# Patient Record
Sex: Male | Born: 1945 | Race: White | Hispanic: No | Smoking: Former smoker
Health system: Southern US, Community
[De-identification: ages and names within clinical notes are randomized; demographics above are authoritative.]

## PROBLEM LIST (undated history)

## (undated) DIAGNOSIS — I420 Dilated cardiomyopathy: Secondary | ICD-10-CM

## (undated) DIAGNOSIS — M1990 Unspecified osteoarthritis, unspecified site: Secondary | ICD-10-CM

## (undated) DIAGNOSIS — K219 Gastro-esophageal reflux disease without esophagitis: Secondary | ICD-10-CM

## (undated) DIAGNOSIS — I219 Acute myocardial infarction, unspecified: Secondary | ICD-10-CM

## (undated) DIAGNOSIS — H33321 Round hole, right eye: Secondary | ICD-10-CM

## (undated) DIAGNOSIS — F1010 Alcohol abuse, uncomplicated: Secondary | ICD-10-CM

## (undated) DIAGNOSIS — I739 Peripheral vascular disease, unspecified: Secondary | ICD-10-CM

## (undated) DIAGNOSIS — I509 Heart failure, unspecified: Secondary | ICD-10-CM

## (undated) DIAGNOSIS — I480 Paroxysmal atrial fibrillation: Secondary | ICD-10-CM

## (undated) DIAGNOSIS — L299 Pruritus, unspecified: Secondary | ICD-10-CM

## (undated) DIAGNOSIS — K5792 Diverticulitis of intestine, part unspecified, without perforation or abscess without bleeding: Secondary | ICD-10-CM

## (undated) DIAGNOSIS — Z720 Tobacco use: Secondary | ICD-10-CM

## (undated) DIAGNOSIS — I2510 Atherosclerotic heart disease of native coronary artery without angina pectoris: Secondary | ICD-10-CM

## (undated) DIAGNOSIS — IMO0001 Reserved for inherently not codable concepts without codable children: Secondary | ICD-10-CM

## (undated) HISTORY — PX: OTHER SURGICAL HISTORY: SHX169

## (undated) HISTORY — DX: Heart failure, unspecified: I50.9

## (undated) HISTORY — PX: EYE SURGERY: SHX253

## (undated) HISTORY — DX: Dilated cardiomyopathy: I42.0

## (undated) HISTORY — PX: VASECTOMY: SHX75

## (undated) HISTORY — DX: Acute myocardial infarction, unspecified: I21.9

---

## 2012-09-02 ENCOUNTER — Encounter: Payer: Self-pay | Admitting: Cardiology

## 2012-09-02 DIAGNOSIS — I471 Supraventricular tachycardia: Secondary | ICD-10-CM

## 2012-09-03 ENCOUNTER — Inpatient Hospital Stay (HOSPITAL_COMMUNITY)
Admission: EM | Admit: 2012-09-03 | Discharge: 2012-09-11 | DRG: 208 | Disposition: A | Discharge status: Home / Self Care | Payer: MEDICARE | Source: Other Acute Inpatient Hospital | Attending: Internal Medicine | Admitting: Internal Medicine

## 2012-09-03 ENCOUNTER — Encounter (HOSPITAL_COMMUNITY): Payer: Self-pay | Admitting: Pulmonary Disease

## 2012-09-03 ENCOUNTER — Inpatient Hospital Stay (HOSPITAL_COMMUNITY): Payer: MEDICARE

## 2012-09-03 DIAGNOSIS — A419 Sepsis, unspecified organism: Secondary | ICD-10-CM

## 2012-09-03 DIAGNOSIS — K56609 Unspecified intestinal obstruction, unspecified as to partial versus complete obstruction: Secondary | ICD-10-CM | POA: Diagnosis present

## 2012-09-03 DIAGNOSIS — I4891 Unspecified atrial fibrillation: Secondary | ICD-10-CM

## 2012-09-03 DIAGNOSIS — J449 Chronic obstructive pulmonary disease, unspecified: Secondary | ICD-10-CM | POA: Insufficient documentation

## 2012-09-03 DIAGNOSIS — K5792 Diverticulitis of intestine, part unspecified, without perforation or abscess without bleeding: Secondary | ICD-10-CM | POA: Diagnosis present

## 2012-09-03 DIAGNOSIS — E870 Hyperosmolality and hypernatremia: Secondary | ICD-10-CM

## 2012-09-03 DIAGNOSIS — K5732 Diverticulitis of large intestine without perforation or abscess without bleeding: Secondary | ICD-10-CM

## 2012-09-03 DIAGNOSIS — I428 Other cardiomyopathies: Secondary | ICD-10-CM

## 2012-09-03 DIAGNOSIS — I5021 Acute systolic (congestive) heart failure: Secondary | ICD-10-CM

## 2012-09-03 DIAGNOSIS — K631 Perforation of intestine (nontraumatic): Secondary | ICD-10-CM | POA: Diagnosis present

## 2012-09-03 DIAGNOSIS — J811 Chronic pulmonary edema: Secondary | ICD-10-CM

## 2012-09-03 DIAGNOSIS — J9600 Acute respiratory failure, unspecified whether with hypoxia or hypercapnia: Principal | ICD-10-CM | POA: Diagnosis present

## 2012-09-03 DIAGNOSIS — I429 Cardiomyopathy, unspecified: Secondary | ICD-10-CM | POA: Diagnosis present

## 2012-09-03 DIAGNOSIS — I959 Hypotension, unspecified: Secondary | ICD-10-CM | POA: Diagnosis present

## 2012-09-03 DIAGNOSIS — F17200 Nicotine dependence, unspecified, uncomplicated: Secondary | ICD-10-CM | POA: Diagnosis present

## 2012-09-03 DIAGNOSIS — R7881 Bacteremia: Secondary | ICD-10-CM | POA: Diagnosis present

## 2012-09-03 DIAGNOSIS — I4892 Unspecified atrial flutter: Secondary | ICD-10-CM | POA: Diagnosis present

## 2012-09-03 DIAGNOSIS — E876 Hypokalemia: Secondary | ICD-10-CM

## 2012-09-03 DIAGNOSIS — I509 Heart failure, unspecified: Secondary | ICD-10-CM | POA: Diagnosis present

## 2012-09-03 DIAGNOSIS — I2510 Atherosclerotic heart disease of native coronary artery without angina pectoris: Secondary | ICD-10-CM

## 2012-09-03 HISTORY — DX: Alcohol abuse, uncomplicated: F10.10

## 2012-09-03 HISTORY — DX: Tobacco use: Z72.0

## 2012-09-03 LAB — URINALYSIS, ROUTINE W REFLEX MICROSCOPIC
Ketones, ur: NEGATIVE mg/dL
Leukocytes, UA: NEGATIVE
Nitrite: NEGATIVE
Specific Gravity, Urine: 1.022 (ref 1.005–1.030)
Urobilinogen, UA: 1 mg/dL (ref 0.0–1.0)

## 2012-09-03 LAB — CBC
HCT: 42.9 % (ref 39.0–52.0)
MCH: 31.6 pg (ref 26.0–34.0)
MCHC: 32.4 g/dL (ref 30.0–36.0)
MCV: 97.5 fL (ref 78.0–100.0)
Platelets: 284 10*3/uL (ref 150–400)
RDW: 14.5 % (ref 11.5–15.5)
WBC: 8.5 10*3/uL (ref 4.0–10.5)

## 2012-09-03 LAB — COMPREHENSIVE METABOLIC PANEL
Albumin: 2 g/dL — ABNORMAL LOW (ref 3.5–5.2)
BUN: 29 mg/dL — ABNORMAL HIGH (ref 6–23)
Calcium: 8.5 mg/dL (ref 8.4–10.5)
Chloride: 106 mEq/L (ref 96–112)
Creatinine, Ser: 0.94 mg/dL (ref 0.50–1.35)
Total Bilirubin: 0.3 mg/dL (ref 0.3–1.2)

## 2012-09-03 LAB — POCT I-STAT 3, ART BLOOD GAS (G3+)
O2 Saturation: 93 %
pCO2 arterial: 49.5 mmHg — ABNORMAL HIGH (ref 35.0–45.0)
pH, Arterial: 7.345 — ABNORMAL LOW (ref 7.350–7.450)

## 2012-09-03 LAB — GLUCOSE, CAPILLARY: Glucose-Capillary: 153 mg/dL — ABNORMAL HIGH (ref 70–99)

## 2012-09-03 LAB — URINE MICROSCOPIC-ADD ON

## 2012-09-03 LAB — APTT: aPTT: 39 seconds — ABNORMAL HIGH (ref 24–37)

## 2012-09-03 LAB — TROPONIN I: Troponin I: <0.3 ng/mL (ref <0.30)

## 2012-09-03 LAB — PROCALCITONIN: Procalcitonin: 0.6 ng/mL

## 2012-09-03 LAB — LACTIC ACID, PLASMA: Lactic Acid, Venous: 1.4 mmol/L (ref 0.5–2.2)

## 2012-09-03 LAB — MAGNESIUM: Magnesium: 2.1 mg/dL (ref 1.5–2.5)

## 2012-09-03 MED ORDER — CHLORHEXIDINE GLUCONATE 0.12 % MT SOLN
15.0000 mL | Freq: Two times a day (BID) | OROMUCOSAL | Status: DC
  Administered 2012-09-03 – 2012-09-05 (×5): 15 mL via OROMUCOSAL
  Filled 2012-09-03 (×5): qty 15

## 2012-09-03 MED ORDER — SODIUM CHLORIDE 0.9 % IV SOLN
INTRAVENOUS | Status: DC
  Administered 2012-09-03: 1000 mL via INTRAVENOUS
  Administered 2012-09-03: 22:00:00 via INTRAVENOUS

## 2012-09-03 MED ORDER — MIDAZOLAM HCL 2 MG/2ML IJ SOLN
1.0000 mg | INTRAMUSCULAR | Status: DC | PRN
  Administered 2012-09-03 – 2012-09-04 (×6): 2 mg via INTRAVENOUS
  Filled 2012-09-03 (×5): qty 2

## 2012-09-03 MED ORDER — SODIUM CHLORIDE 0.9 % IV SOLN
1.0000 g | INTRAVENOUS | Status: DC
  Administered 2012-09-03 – 2012-09-04 (×2): 1 g via INTRAVENOUS
  Filled 2012-09-03 (×3): qty 1

## 2012-09-03 MED ORDER — PANTOPRAZOLE SODIUM 40 MG IV SOLR
40.0000 mg | Freq: Every day | INTRAVENOUS | Status: DC
  Administered 2012-09-03 – 2012-09-10 (×8): 40 mg via INTRAVENOUS
  Filled 2012-09-03 (×9): qty 40

## 2012-09-03 MED ORDER — SODIUM CHLORIDE 0.9 % IV SOLN
250.0000 mL | INTRAVENOUS | Status: DC | PRN
  Administered 2012-09-03: 250 mL via INTRAVENOUS
  Administered 2012-09-03: 22:00:00 via INTRAVENOUS

## 2012-09-03 MED ORDER — METOPROLOL TARTRATE 1 MG/ML IV SOLN: 2.5000 mg | INTRAVENOUS | Status: DC | PRN

## 2012-09-03 MED ORDER — AMIODARONE HCL IN DEXTROSE 360-4.14 MG/200ML-% IV SOLN
0.5000 mg/min | INTRAVENOUS | Status: DC
  Administered 2012-09-03 – 2012-09-10 (×12): 0.5 mg/min via INTRAVENOUS
  Filled 2012-09-03 (×31): qty 200

## 2012-09-03 MED ORDER — ASPIRIN 81 MG PO CHEW
324.0000 mg | CHEWABLE_TABLET | ORAL | Status: AC
Start: 2012-09-03 — End: 2012-09-03

## 2012-09-03 MED ORDER — METOPROLOL TARTRATE 1 MG/ML IV SOLN
2.5000 mg | INTRAVENOUS | Status: DC | PRN
  Administered 2012-09-04 (×2): 5 mg via INTRAVENOUS
  Filled 2012-09-03 (×2): qty 5

## 2012-09-03 MED ORDER — ASPIRIN 300 MG RE SUPP
300.0000 mg | RECTAL | Status: AC
  Administered 2012-09-03: 300 mg via RECTAL
  Filled 2012-09-03: qty 1

## 2012-09-03 MED ORDER — ALBUTEROL SULFATE HFA 108 (90 BASE) MCG/ACT IN AERS
6.0000 | INHALATION_SPRAY | RESPIRATORY_TRACT | Status: DC | PRN
  Filled 2012-09-03: qty 6.7

## 2012-09-03 MED ORDER — PROPOFOL 10 MG/ML IV EMUL
INTRAVENOUS | Status: AC
  Filled 2012-09-03: qty 100

## 2012-09-03 MED ORDER — BIOTENE DRY MOUTH MT LIQD
15.0000 mL | Freq: Four times a day (QID) | OROMUCOSAL | Status: DC
Start: 2012-09-04 — End: 2012-09-06
  Administered 2012-09-04 – 2012-09-05 (×8): 15 mL via OROMUCOSAL

## 2012-09-03 MED ORDER — PHENYLEPHRINE HCL 10 MG/ML IJ SOLN
30.0000 ug/min | INTRAVENOUS | Status: DC
  Administered 2012-09-03: 30 ug/min via INTRAVENOUS
  Administered 2012-09-04: 15 ug/min via INTRAVENOUS
  Filled 2012-09-03 (×2): qty 1

## 2012-09-03 MED ORDER — SODIUM CHLORIDE 0.9 % IV SOLN
25.0000 ug/h | INTRAVENOUS | Status: DC
  Administered 2012-09-03: 50 ug/h via INTRAVENOUS
  Filled 2012-09-03 (×2): qty 50

## 2012-09-03 MED ORDER — FENTANYL BOLUS VIA INFUSION
25.0000 ug | Freq: Four times a day (QID) | INTRAVENOUS | Status: DC | PRN
  Filled 2012-09-03: qty 100

## 2012-09-03 MED ORDER — AMIODARONE HCL IN DEXTROSE 360-4.14 MG/200ML-% IV SOLN
1.0000 mg/min | INTRAVENOUS | Status: AC
  Administered 2012-09-03: 1 mg/min via INTRAVENOUS
  Filled 2012-09-03: qty 200

## 2012-09-03 NOTE — H&P (Signed)
PULMONARY  / CRITICAL CARE MEDICINE  Name: Ronnie Harvey MRN: [id number] DOB: 10/26/1945    LOS: 0  REFERRING MD :  [facility]  CHIEF COMPLAINT:  Acute Respiratory Failure / SBO  BRIEF PATIENT DESCRIPTION: 51 M with no PMH adm to [facility] on 12/14 with abd pain.  CT abd > diverticulitis with microperforation.  Course complicated by AFRVR, decompensation of respiratory status requiring intubation 12/17.  Tx to ICU at [facility] for further care.      LINES / TUBES: 12/17 ETT>>> 12/17 R [place] [facility]>>>  CULTURES: 12/14 UA>>>mod bacteria, neg nitrate  ANTIBIOTICS: 12/15 Zosyn>>>12/17 12/17 Invanz>>>  SIGNIFICANT EVENTS:  12/14 - Admit to [facility] with SBO 12/15 - SVT / Afib with RVR 12/15 Echo - LVEF 20-25%, diffuse HK with some variation (post-lateral worse than anterior) 12/17 - early am, decompensated requiring intubation, central line placement, tx to [facility]  LEVEL OF CARE:  ICU PRIMARY SERVICE:  PCCM CONSULTANTS:  CCS CODE STATUS:  Full Code DIET:  NPO DVT Px:  SCD's GI Px:  Protonix  HISTORY OF PRESENT ILLNESS: 65 y/o M with no prior known medical hx, known former ETOH, continued smoking who presented to [facility] on 12/14 with a one week hx of of abdominal pain.  Found to have a SBO, sigmoid diverticulitis with free fluid & micro pockets of air.  Conservatively managed with NGT, bowel rest,   12/15 decompensated with SVT and was transferred to ICU.  Pt noted to have Afib with RVR, ECHO with EF of 20%, severe global hypokinesis and PASP of 60.  He was treated with Cardizem for SVT but changed to amiodarone (unclear if r/t hypotension).  Early 12/17 am, patient decompensated requiring intubation. Tx to [facility] for further evaluation. Of note, early in the summer he had episodes of dyspnea and would not go for evaluation.     PAST MEDICAL HISTORY :  Past Medical History  Diagnosis Date  . ETOH abuse   . Tobacco abuse    Past Surgical History   Procedure Date  . Neck cyst resection    Prior to Admission medications   Not on File  No known Prior Medications  No Known Allergies  FAMILY HISTORY:  No family history on file.  SOCIAL HISTORY:  reports that he has been smoking Cigarettes.  He has a 49 pack-year smoking history. He does not have any smokeless tobacco history on file. He reports that he does not drink alcohol. His drug history not on file.  REVIEW OF SYSTEMS:  Unable to complete as pt on vent / sedated.    INTERVAL HISTORY: sedate on vent, no distress, hemodynamically stable  VITAL SIGNS: FiO2 (%):  [50 %] 50 % (12/17 1530) HEMODYNAMICS:   VENTILATOR SETTINGS: Vent Mode:  [-] PRVC FiO2 (%):  [50 %] 50 % Set Rate:  [18 bmp] 18 bmp Vt Set:  [500 mL] 500 mL PEEP:  [5 cmH20] 5 cmH20  INTAKE / OUTPUT: Intake/Output    None     PHYSICAL EXAMINATION: General:  wdwn adult male on vent Neuro:  Sedate, pinpoint pupils HEENT:  Mm pink/moist, OETT 7.5 @ 23 Cardiovascular:  s1s2 rrr, no m/r/g, no jvd Lungs: resp's even / non-labored on vent, lungs bilaterally with faint crackles Abdomen:  Distended, tympanic, unable to assess tenderness Musculoskeletal:  No acute deformities Skin:  Warm/dry, no overt edema   LABS: Cbc No results found for this basename: WBC:,HGB:3,HCT:3,PLT:3 in the last 168 hours  Chemistry  No results found for this basename: NA:3,K:3,CL:3,CO2:3,BUN:3,CREATININE:3,CALCIUM:3,MG:3,PHOS:3,GLUCOSE:3 in the last 168 hours  Liver fxn No results found for this basename: AST:3,ALT:3,ALKPHOS:3,BILITOT:3,PROT:3,ALBUMIN:3 in the last 168 hours  coags No results found for this basename: APTT:3,INR:3 in the last 168 hours  Sepsis markers No results found for this basename: LATICACIDVEN:3,PROCALCITON:3 in the last 168 hours  Cardiac markers No results found for this basename: CKTOTAL:3,CKMB:3,TROPONINI:3 in the last 168 hours  BNP No results found for this basename: PROBNP:3 in the last  168 hours  ABG No results found for this basename: PHART:3,PCO2ART:3,PO2ART:3,HCO3:3,TCO2:3 in the last 168 hours  CBG trend No results found for this basename: GLUCAP:5 in the last 168 hours  IMAGING: 12/17 CXR: improved edema  ECG:  DIAGNOSES: Principal Problem:  *Diverticulitis Active Problems:  Bowel microperforation  Acute respiratory failure due to pulmonary edema  Paroxysmal AF  Cardiomyopathy  Smoker   ASSESSMENT / PLAN:  PULMONARY  ASSESSMENT: Acute Respiratory Failure  - in setting of abdominal process, Afib with RVR, Cardiogenic pulmonary edema Presumed COPD - no hx of PFT's, 49 yr smoking hx. Elevated PA pressures noted on ECHO (PAS 60).     PLAN:   -full vent support, adjust tidal volume -repeat ABG in one hour -eval CXR to confirm ETT post transfer  CARDIOVASCULAR  ASSESSMENT:  Atrial Fibrilation with RVR - SVT / RVR noted at [facility], rx'd with cardizem initially and changed to amiodarone - unclear if this was related to hypotension.  CAD - noted femoral bruits on exam and evidence of atherosclerotic disease in aorta & coronary arteries.   Mild bump (0.7, 0.9) in troponin with no associated chest pain, BNP mildly elevated.  CHF - EF of 20%, mod LVE, mod MR, mild-mod TR and PASP of 60   PLAN:  -hold further diuresis 12/17, received 80 mg lasix 12/17 at [facility] -Cardiology Consult -Cont amiodarone -PRN beta blocker for HR > 115 -Aspirin  -cycle biomarkers -am EKG  RENAL  ASSESSMENT:   No acute issues.   PLAN:   -monitor renal function, electrolytes with SBO -f/u BMP in am  GASTROINTESTINAL  ASSESSMENT:   Diverticulitis with microperforation -     PLAN:   -CCS consult -NGT to [facility] -PPI ordered    HEMATOLOGIC  ASSESSMENT:   No acute issues.   PLAN:  -monitor h/h   INFECTIOUS  ASSESSMENT:   SBO, ? Perforated diverticulitis with associated peritonitis.   ? UTI  - noted mod bacteria on UA, no nitrate  PLAN:   -hold  cultures as was on abx upon transfer.  -Abx as above   ENDOCRINE  ASSESSMENT:   No acute issues.     PLAN:   -CBG's Q6 while NPO  NEUROLOGIC  ASSESSMENT:   Pain - in setting of SBO  PLAN:   -Fentanyl gtt, PRN versed pushes for sedtion -d/c propofol -assess mental status once off propofol  CLINICAL SUMMARY:      I have personally obtained a history, examined the patient, evaluated laboratory and imaging results, formulated the assessment and plan and placed orders.  CRITICAL CARE: The patient is critically ill with multiple organ systems failure and requires high complexity decision making for assessment and support, frequent evaluation and titration of therapies, application of advanced monitoring technologies and extensive interpretation of multiple databases. Critical Care Time devoted to patient care services described in this note is 45 minutes.     Merwyn Katos, MD Pulmonary and Critical Care Medicine [facility] Pager: [phone]  09/03/2012,  4:22 PM

## 2012-09-03 NOTE — Consult Note (Signed)
Reason for Consult:microperforated diverticulitis   Ronnie Harvey is an 66 y.o. male.  HPI: the patient is a 66 year old was transferred from an outside hospital. Patient at the time of admission to the outside hospital was diagnosed with microperforation of diverticulitis. At this time patient had exacerbation of his congestive heart failure requiring intubation.  Secondary this patient transferred a [facility] hospital for escalation of care. Patient has been continued on Neo-Synephrine as well as Invanz. Patient was said to have been transferred with a CD of a CT scan at outside hospital that was not able to review this CD.  Past Medical History  Diagnosis Date  . ETOH abuse   . Tobacco abuse     Past Surgical History  Procedure Date  . Neck cyst resection     No family history on file.  Social History:  reports that he has been smoking Cigarettes.  He has a 49 pack-year smoking history. He does not have any smokeless tobacco history on file. He reports that he does not drink alcohol. His drug history not on file.  Allergies: No Known Allergies  Medications: I have reviewed the patient's current medications.  Results for orders placed during the hospital encounter of 09/03/12 (from the past 48 hour(s))  MRSA PCR SCREENING     Status: Normal   Collection Time   09/03/12  3:24 PM      Component Value Range Comment   MRSA by PCR NEGATIVE  NEGATIVE   GLUCOSE, CAPILLARY     Status: Abnormal   Collection Time   09/03/12  3:53 PM      Component Value Range Comment   Glucose-Capillary 153 (*) 70 - 99 mg/dL   TROPONIN I     Status: Normal   Collection Time   09/03/12  3:58 PM      Component Value Range Comment   Troponin I <0.30  <0.30 ng/mL   PRO B NATRIURETIC PEPTIDE     Status: Abnormal   Collection Time   09/03/12  3:58 PM      Component Value Range Comment   Pro B Natriuretic peptide (BNP) 7946.0 (*) 0 - 125 pg/mL   PROCALCITONIN     Status: Normal   Collection Time   09/03/12  4:30 PM      Component Value Range Comment   Procalcitonin 0.60     PROTIME-INR     Status: Abnormal   Collection Time   09/03/12  4:30 PM      Component Value Range Comment   Prothrombin Time 17.7 (*) 11.6 - 15.2 seconds    INR 1.50 (*) 0.00 - 1.49   APTT     Status: Abnormal   Collection Time   09/03/12  4:30 PM      Component Value Range Comment   aPTT 39 (*) 24 - 37 seconds   POCT I-STAT 3, BLOOD GAS (G3+)     Status: Abnormal   Collection Time   09/03/12  5:43 PM      Component Value Range Comment   pH, Arterial 7.345 (*) 7.350 - 7.450    pCO2 arterial 49.5 (*) 35.0 - 45.0 mmHg    pO2, Arterial 73.0 (*) 80.0 - 100.0 mmHg    Bicarbonate 27.0 (*) 20.0 - 24.0 mEq/L    TCO2 29  0 - 100 mmol/L    O2 Saturation 93.0      Acid-Base Excess 1.0  0.0 - 2.0 mmol/L    Collection site RADIAL,  ALLEN'S TEST ACCEPTABLE      Drawn by Operator      Sample type ARTERIAL       Portable Chest Xray To Verify Ett/ Central Line Placement  09/03/2012  *RADIOLOGY REPORT*  Clinical Data: ET tube placement.  PORTABLE CHEST - 1 VIEW  Comparison: Chest 09/03/2012 at.  Findings: Endotracheal tube is in place with tip just below the clavicular heads in good position.  NG tube courses into the stomach.  Bilateral airspace disease has improved.  No pneumothorax identified.  There appear be small bilateral pleural effusions.  IMPRESSION:  1.  ET tube in good position. 2.  Improved bilateral airspace disease.   Original Report Authenticated By: Holley Dexter, M.D.     Review of Systems  Unable to perform ROS  Blood pressure 111/63, pulse 70, temperature 97.7 F (36.5 C), temperature source Oral, resp. rate 18, height 5\' 4"  (1.626 m), weight 156 lb 15.5 oz (71.2 kg), SpO2 93.00%. Physical Exam  Constitutional: He is oriented to person, place, and time. He appears well-developed and well-nourished.  HENT:  Head: Normocephalic and atraumatic.  Eyes: Conjunctivae normal and EOM are normal.  Pupils are equal, round, and reactive to light.  Neck: Normal range of motion. Neck supple.  Cardiovascular: Normal rate and normal heart sounds.   Respiratory: Effort normal and breath sounds normal.  GI: Soft. Bowel sounds are normal. He exhibits no mass.  Musculoskeletal: Normal range of motion.  Neurological: He is alert and oriented to person, place, and time.    Assessment/Plan: The patient is a 67 year old male with a microperforation of diverticulitis  At this time continue antibiotics and N.p.o.. At this point there is no surgical intervention is planned. Continue supportive care. We'll continue to follow.  Marigene Ehlers., Cyndi Montejano 09/03/2012, 6:29 PM

## 2012-09-03 NOTE — Progress Notes (Signed)
ANTIBIOTIC CONSULT NOTE - INITIAL  Pharmacy Consult for Invanz Indication: perf. bowel  No Known Allergies  Patient Measurements:   Adjusted Body Weight: 71.2 kg  Vital Signs:   Intake/Output from previous day:   Intake/Output from this shift:    Labs: No results found for this basename: WBC:3,HGB:3,PLT:3,LABCREA:3,CREATININE:3 in the last 72 hours CrCl is unknown because no creatinine reading has been taken and the patient has no height on file. No results found for this basename: VANCOTROUGH:2,VANCOPEAK:2,VANCORANDOM:2,GENTTROUGH:2,GENTPEAK:2,GENTRANDOM:2,TOBRATROUGH:2,TOBRAPEAK:2,TOBRARND:2,AMIKACINPEAK:2,AMIKACINTROU:2,AMIKACIN:2, in the last 72 hours   From [facility]: WBC 17.8; H/H 15.4/49.8; Pltc 408 Scr 0.77  Microbiology: No results found for this or any previous visit (from the past 720 hour(s)).  Medical History: Past Medical History  Diagnosis Date  . ETOH abuse   . Tobacco abuse     Medications:  No prescriptions prior to admission  current list pending  Assessment: 66 yo male transferred from [facility] with acute systolic HF, perforated diverticulitis.  Pharmacy asked to dose empiric ertapenem.  Dose was ordered at [facility] today per records, but doesn't appear that dose was given.   Est. CrCl ~ 70 ml/min, labs from [facility] pending.  Goal of Therapy:  Resolution of infection  Plan: 1. Ertapenem 1g IV q 24 hrs. 2. F/U plans, cultures.  Lindsie Simar C 09/03/2012,4:19 PM

## 2012-09-03 NOTE — Progress Notes (Signed)
Call to Dr Frederico Hamman to report hypotension; NS X2 going at 999 each.

## 2012-09-03 NOTE — Progress Notes (Signed)
  Amiodarone Drug - Drug Interaction Consult Note  Recommendations: No issues identified at this time.  Will continue to monitor.  Amiodarone is metabolized by the cytochrome P450 system and therefore has the potential to cause many drug interactions. Amiodarone has an average plasma half-life of 50 days (range 20 to 100 days).   There is potential for drug interactions to occur several weeks or months after stopping treatment and the onset of drug interactions may be slow after initiating amiodarone.   []  Statins: Increased risk of myopathy. Simvastatin- restrict dose to 20mg  daily. Other statins: counsel patients to report any muscle pain or weakness immediately.  []  Anticoagulants: Amiodarone can increase anticoagulant effect. Consider warfarin dose reduction. Patients should be monitored closely and the dose of anticoagulant altered accordingly, remembering that amiodarone levels take several weeks to stabilize.  []  Antiepileptics: Amiodarone can increase plasma concentration of phenytoin, phenytoin dose should be reduced. Note that small changes in phenytoin dose can result in large changes in phenytoin levels. Monitor patient closely and counsel on signs of toxicity.  []  Beta blockers: increased risk of bradycardia, AV block and myocardial depression. Sotalol - avoid concomitant use.  []   Calcium channel blockers (diltiazem and verapamil): increased risk of bradycardia, AV block and myocardial depression.  []   Cyclosporine: Amiodarone increases levels of cyclosporine. Reduced dose of cyclosporine is recommended.  []  Digoxin dose should be halved when amiodarone is started.  []  Diuretics: increased risk of cardiotoxicity if hypokalemia occurs.  []  Oral hypoglycemic agents (glyburide, glipizide, glimepiride): increased risk of hypoglycemia. Patient's glucose levels should be monitored closely when initiating amiodarone therapy.   []  Drugs that prolong the QT interval: Concurrent  therapy is contraindicated due to the increased risk of torsades de pointes; . Antibiotics: e.g. fluoroquinolones, erythromycin. . Antiarrhythmics: e.g. quinidine, procainamide, disopyramide, sotalol. . Antipsychotics: e.g. phenothiazines, haloperidol.  . Lithium, tricyclic antidepressants, and methadone. Thank You,  Gardner Candle  09/03/2012 8:13 PM

## 2012-09-04 ENCOUNTER — Inpatient Hospital Stay (HOSPITAL_COMMUNITY): Payer: MEDICARE

## 2012-09-04 DIAGNOSIS — K631 Perforation of intestine (nontraumatic): Secondary | ICD-10-CM

## 2012-09-04 DIAGNOSIS — J4489 Other specified chronic obstructive pulmonary disease: Secondary | ICD-10-CM

## 2012-09-04 DIAGNOSIS — J449 Chronic obstructive pulmonary disease, unspecified: Secondary | ICD-10-CM

## 2012-09-04 LAB — HEPATIC FUNCTION PANEL
Bilirubin, Direct: 0.1 mg/dL (ref 0.0–0.3)
Total Bilirubin: 0.3 mg/dL (ref 0.3–1.2)

## 2012-09-04 LAB — BLOOD GAS, ARTERIAL
O2 Saturation: 97.5 %
PEEP: 5 cmH2O
Patient temperature: 98.6
RATE: 18 resp/min

## 2012-09-04 LAB — BASIC METABOLIC PANEL
CO2: 28 mEq/L (ref 19–32)
Glucose, Bld: 100 mg/dL — ABNORMAL HIGH (ref 70–99)
Potassium: 3.7 mEq/L (ref 3.5–5.1)
Sodium: 147 mEq/L — ABNORMAL HIGH (ref 135–145)

## 2012-09-04 LAB — CBC
Hemoglobin: 14.6 g/dL (ref 13.0–17.0)
MCH: 31.6 pg (ref 26.0–34.0)
RBC: 4.62 MIL/uL (ref 4.22–5.81)

## 2012-09-04 LAB — TSH: TSH: 1.998 u[IU]/mL (ref 0.350–4.500)

## 2012-09-04 MED ORDER — FUROSEMIDE 10 MG/ML IJ SOLN
40.0000 mg | Freq: Two times a day (BID) | INTRAMUSCULAR | Status: DC
  Administered 2012-09-04 – 2012-09-05 (×2): 40 mg via INTRAVENOUS
  Filled 2012-09-04 (×4): qty 4

## 2012-09-04 MED ORDER — HEPARIN SODIUM (PORCINE) 5000 UNIT/ML IJ SOLN
5000.0000 [IU] | Freq: Three times a day (TID) | INTRAMUSCULAR | Status: DC
  Administered 2012-09-04 – 2012-09-11 (×21): 5000 [IU] via SUBCUTANEOUS
  Filled 2012-09-04 (×24): qty 1

## 2012-09-04 NOTE — Progress Notes (Signed)
Patient ID: Ronnie Harvey, male   DOB: April 29, 1946, 66 y.o.   MRN: [id number]    Subjective: On vent  Objective: Vital signs in last 24 hours: Temp:  [97.7 F (36.5 C)-98.9 F (37.2 C)] 98.9 F (37.2 C) (12/18 0430) Pulse Rate:  [47-111] 111  (12/18 0746) Resp:  [17-25] 17  (12/18 0746) BP: (74-133)/(45-84) 111/84 mmHg (12/18 0746) SpO2:  [91 %-96 %] 96 % (12/18 0746) FiO2 (%):  [40 %-50 %] 40 % (12/18 0746) Weight:  [156 lb 12 oz (71.1 kg)-156 lb 15.5 oz (71.2 kg)] 156 lb 12 oz (71.1 kg) (12/18 0400)    Intake/Output from previous day: 12/17 0701 - 12/18 0700 In: 1496.2 [I.V.:1286.2; NG/GT:120; IV Piggyback:60] Out: 745 [Urine:445; Emesis/NG output:300] Intake/Output this shift:    General appearance: alert, uncooperative and responsive but apparently confused GI: Active BS.  Mild ditention but soft and without apparent tenderness  Lab Results:   Basename 09/04/12 0400 09/03/12 2042  WBC 9.7 8.5  HGB 14.6 13.9  HCT 45.0 42.9  PLT 316 284   BMET  Basename 09/04/12 0400 09/03/12 2042  NA 147* 144  K 3.7 3.4*  CL 108 106  CO2 28 28  GLUCOSE 100* 135*  BUN 30* 29*  CREATININE 1.03 0.94  CALCIUM 8.9 8.5     Studies/Results: Portable Chest Xray In Am  09/04/2012  *RADIOLOGY REPORT*  Clinical Data: Evaluate pulmonary edema, endotracheal tube  PORTABLE CHEST - 1 VIEW  Comparison: 09/03/2012 (multiple examinations); 09/02/2012; 08/30/2012  Findings: Grossly unchanged enlarged cardiac silhouette and mediastinal contours with tortuosity within the thoracic aorta. Atherosclerotic calcifications in the aortic arch.  The pulmonary vasculature remains indistinct.  Grossly unchanged perihilar heterogeneous air space opacities, right greater than left.  No definite pleural effusion or pneumothorax.  Unchanged bones.  IMPRESSION: 1.  Stable positioning of support apparatus.  No pneumothorax. 2.  Grossly unchanged perihilar air space opacities which given rapidity of onset is  favored to represent pulmonary edema, though note, underlying infection and/or aspiration is not excluded.   Original Report Authenticated By: Tacey Ruiz, MD    Portable Chest Xray To Verify Ett/ Central Line Placement  09/03/2012  *RADIOLOGY REPORT*  Clinical Data: ET tube placement.  PORTABLE CHEST - 1 VIEW  Comparison: Chest 09/03/2012 at.  Findings: Endotracheal tube is in place with tip just below the clavicular heads in good position.  NG tube courses into the stomach.  Bilateral airspace disease has improved.  No pneumothorax identified.  There appear be small bilateral pleural effusions.  IMPRESSION:  1.  ET tube in good position. 2.  Improved bilateral airspace disease.   Original Report Authenticated By: Holley Dexter, M.D.     Anti-infectives: Anti-infectives     Start     Dose/Rate Route Frequency Ordered Stop   09/03/12 1615   ertapenem (INVANZ) 1 g in sodium chloride 0.9 % 50 mL IVPB        1 g 100 mL/hr over 30 Minutes Intravenous Every 24 hours 09/03/12 1603            Assessment/Plan: Transfer in for VDRF, CHF and pulm edema Sigmoid diverticulitis with microperforation on presentation to outside hospital He is afebrile with normal WBC and abdomen seems non tender Doubt source of sepsis in abdomen. Continue IV abx and repeat CT in a couple of days    LOS: 1 day    Leonidus Rowand T 09/04/2012

## 2012-09-04 NOTE — Progress Notes (Signed)
Patient: Ronnie Harvey / Admit Date: 09/03/2012 / Date of Encounter: 09/04/2012, 1:15 PM   Subjective  Interim history: 66 y/o M with no prior medical history presented to [facility] 08/31/12 with abdominal pain and diagnosed with diverticulitis and potentially perforated diverticulum. He was seen by cardiology and found to have SVT felt to be atrial flutter as well as atrial fib. CT did demonstrate atherosclerotic calcification within the aorta and coronary arteries. Troponin up to 0.9 at [facility] (ref range 0.3-0.6). 2D echo showed EF of 20%, LV chamber mod dilated, global severe HK per notes, RVSP & mod MR. He was sent down for CT at [facility] and was noted to be diaphoretic, tachycardic and in congestive heart failure. He was intubated for acute respiratory failure and given IV Lasix. He was transferred to [facility] for further critical care management. He remains on antibiotics. Supportive care at present for perforation.  Was sinus for a while this AM, but recently went back into afib. Just received IV metoprolol and rates low 100's. (when in sinus, HR was 70s-80s). Pt denies any pain.   Objective   Telemetry: atrial fib 130s-160s Physical Exam: Filed Vitals:   09/04/12 1100  BP: 135/99  Pulse: 134  Temp:   Resp: 23  Temp 99, 97% vent General: Well developed, well nourished WM on vent Head: Normocephalic, atraumatic, sclera non-icteric, no xanthomas, nares are without discharge. ?exopthalmos Neck: JVD not elevated. Lungs: Coarse BS bilaterally, no wheezes or rales. Breathing is unlabored. Heart: RRR S1 S2 without murmurs, rubs, or gallops.  Abdomen: Distended, hypoactive BS, nontender. Msk: No acute deformities. Extremities: Warm, dry, no edema. Distal pulses in tact. Neuro: Alert on vent. Able to follow commands. Frustrated with mittens.  Psych:  Intubated.    Intake/Output Summary (Last 24 hours) at 09/04/12 1315 Last data filed at 09/04/12  0900  Gross per 24 hour  Intake 1666.33 ml  Output    745 ml  Net 921.33 ml    Inpatient Medications:    . antiseptic oral rinse  15 mL Mouth Rinse QID  . chlorhexidine  15 mL Mouth Rinse BID  . ertapenem  1 g Intravenous Q24H  . furosemide  40 mg Intravenous Q12H  . heparin subcutaneous  5,000 Units Subcutaneous Q8H  . pantoprazole (PROTONIX) IV  40 mg Intravenous QHS    Labs:  Basename 09/04/12 0400 09/03/12 2042  NA 147* 144  K 3.7 3.4*  CL 108 106  CO2 28 28  GLUCOSE 100* 135*  BUN 30* 29*  CREATININE 1.03 0.94  CALCIUM 8.9 8.5  MG -- 2.1  PHOS -- --    Basename 09/04/12 0400 09/03/12 2042  AST 21 19  ALT 13 13  ALKPHOS 59 55  BILITOT 0.3 0.3  PROT 6.0 5.7*  ALBUMIN 2.1* 2.0*    Basename 09/04/12 0400 09/03/12 2042  WBC 9.7 8.5  NEUTROABS -- --  HGB 14.6 13.9  HCT 45.0 42.9  MCV 97.4 97.5  PLT 316 284    Basename 09/04/12 0400 09/03/12 2118 09/03/12 1558  CKTOTAL -- -- --  CKMB -- -- --  TROPONINI <0.30 <0.30 <0.30   No components found with this basename: POCBNP:3 No results found for this basename: HGBA1C in the last 72 hours No results found for this basename: CHOL,HDL,LDLCALC,TRIG,CHOLHDL in the last 72 hours  Radiology/Studies:  Portable Chest Xray In Am  09/04/2012  *RADIOLOGY REPORT*  Clinical Data: Evaluate pulmonary edema, endotracheal tube  PORTABLE CHEST -  1 VIEW  Comparison: 09/03/2012 (multiple examinations); 09/02/2012; 08/30/2012  Findings: Grossly unchanged enlarged cardiac silhouette and mediastinal contours with tortuosity within the thoracic aorta. Atherosclerotic calcifications in the aortic arch.  The pulmonary vasculature remains indistinct.  Grossly unchanged perihilar heterogeneous air space opacities, right greater than left.  No definite pleural effusion or pneumothorax.  Unchanged bones.  IMPRESSION: 1.  Stable positioning of support apparatus.  No pneumothorax. 2.  Grossly unchanged perihilar air space opacities which given  rapidity of onset is favored to represent pulmonary edema, though note, underlying infection and/or aspiration is not excluded.   Original Report Authenticated By: Tacey Ruiz, MD    Portable Chest Xray To Verify Ett/ Central Line Placement  09/03/2012  *RADIOLOGY REPORT*  Clinical Data: ET tube placement.  PORTABLE CHEST - 1 VIEW  Comparison: Chest 09/03/2012 at.  Findings: Endotracheal tube is in place with tip just below the clavicular heads in good position.  NG tube courses into the stomach.  Bilateral airspace disease has improved.  No pneumothorax identified.  There appear be small bilateral pleural effusions.  IMPRESSION:  1.  ET tube in good position. 2.  Improved bilateral airspace disease.   Original Report Authenticated By: Holley Dexter, M.D.      Assessment and Plan  1. Diverticulitis with bowel microperforation, SBO - surgery following. 2. Acute respiratory failure, requiring intubation - vent mgmt per PCCM. In setting of abdominal process, afib RVR, cardiogenic pulmonary edema. Diuresis has been ordered. 3. Paroxysmal atrial fib & atrial flutter (newly recognized this adm) with RVR - likely exacerbated by acute illness. CHADS2 = 1 at present. Would not fully anticoagulate in setting of acute abdominal illness. Will discuss rate mgmt with MD. Continue amiodarone for now. 4. Elevated troponins with possible underlying CAD - trop 0.09 at [facility]. Troponins negative here. Atherosclerotic dz on CT at that time. Will need cath when clinical status improves. If OK with surgery, continue low dose ASA. 5. Acute systolic CHF/newly recognized cardiomyopathy with EF 20%  - received 80mg  IV Lasix at [facility]. Written to receive 40mg  IV q12 x 4 doses beginning this afternoon. Cr stable. Follow I/O, daily wts. 6. Elevated PT/INR - repeat in AM. 7. Hypotension - BP down to 70's yesterday systolic requiring IVF, brief neo. Still soft this AM but currently stable. Will also send blood cx. 8. Mod  pHTN - ? r/t longstanding tobacco abuse or LV dysfunction.  Signed, Ronie Spies PA-C Patient seen and examined. I agree with the assessment and plan as detailed above. See also my additional thoughts below.   I have reviewed all of the data extensively. The patient is seen and examined. I have spoken with Ronie Spies PA-C about the patient. I agree with all the plans that have been outlined and I have made my own adjustments in the notes. We saw the patient for the first time earlier this week at [facility]. He had atrial fibrillation. The echo finding of an ejection fraction of 20% was new information. The patient needs to be treated for his current respiratory failure with heart failure and his perforated viscus. Then before he goes home he needs to undergo cardiac catheterization.  Willa Rough, MD, [facility] 09/04/2012 3:37 PM

## 2012-09-04 NOTE — H&P (Signed)
PULMONARY  / CRITICAL CARE MEDICINE  Name: KWAMANE WHACK MRN: [id number] DOB: 18-Dec-1945    LOS: 1  REFERRING MD :  [facility]  CHIEF COMPLAINT:  Acute Respiratory Failure / SBO  BRIEF PATIENT DESCRIPTION: 21 M with no PMH adm to [facility] on 12/14 with abd pain.  CT abd > diverticulitis with microperforation.  Course complicated by AFRVR, decompensation of respiratory status requiring intubation 12/17.  Tx to ICU at [facility] for further care.    LINES / TUBES: 12/17 ETT>>> 12/17 R Watts TLC>>>  CULTURES: 12/14 UA>>>  ANTIBIOTICS: 12/15 Zosyn>>>12/17 12/17 Invanz>>>  SIGNIFICANT EVENTS:  12/14 - Admit to [facility] with SBO 12/15 - SVT / Afib with RVR 12/15 Echo - LVEF 20-25%, diffuse HK with some variation (post-lateral worse than anterior) 12/17 - early am, decompensated requiring intubation, central line placement, tx to [facility] 12/18- failed weaning  LEVEL OF CARE:  ICU PRIMARY SERVICE:  PCCM CONSULTANTS:  CCS CODE STATUS:  Full Code DIET:  NPO DVT Px:  SCD's GI Px:  Protonix  INTERVAL HISTORY:  Failed weaning this am  VITAL SIGNS: Temp:  [97.7 F (36.5 C)-99 F (37.2 C)] 99 F (37.2 C) (12/18 0848) Pulse Rate:  [47-134] 134  (12/18 1100) Resp:  [16-25] 23  (12/18 1100) BP: (74-140)/(45-99) 135/99 mmHg (12/18 1100) SpO2:  [91 %-97 %] 97 % (12/18 1100) FiO2 (%):  [40 %-50 %] 40 % (12/18 1100) Weight:  [71.1 kg (156 lb 12 oz)-71.2 kg (156 lb 15.5 oz)] 71.1 kg (156 lb 12 oz) (12/18 0400) HEMODYNAMICS: CVP:  [8 mmHg-18 mmHg] 14 mmHg VENTILATOR SETTINGS: Vent Mode:  [-] PRVC FiO2 (%):  [40 %-50 %] 40 % Set Rate:  [18 bmp] 18 bmp Vt Set:  [500 mL] 500 mL PEEP:  [5 cmH20] 5 cmH20 Pressure Support:  [8 cmH20] 8 cmH20 Plateau Pressure:  [14 cmH20-18 cmH20] 14 cmH20  INTAKE / OUTPUT: Intake/Output      12/17 0701 - 12/18 0700 12/18 0701 - 12/19 0700   I.V. (mL/kg) 1342.9 (18.9) 113.4 (1.6)   Other 30    NG/GT 120    IV Piggyback 60     Total Intake(mL/kg) 1552.9 (21.8) 113.4 (1.6)   Urine (mL/kg/hr) 445 (0.3)    Emesis/NG output 300    Total Output 745    Net +807.9 +113.4          PHYSICAL EXAMINATION: General:  wdwn adult male on vent Neuro:  Sedate, rass 0, follows commands HEENT:  jvd nwl Cardiovascular:  s1s2 rrr, no m/r/g, no jvd Lungs: coarse bilateral Abdomen:  Distended, tympanic, nontender, bs hypo, no r/g Musculoskeletal:  No acute deformities Skin:  Warm/dry, no overt edema   LABS: Cbc  Lab 09/04/12 0400 09/03/12 2042  WBC 9.7 --  HGB 14.6 13.9  HCT 45.0 42.9  PLT 316 284    Chemistry   Lab 09/04/12 0400 09/03/12 2042  NA 147* 144  K 3.7 3.4*  CL 108 106  CO2 28 28  BUN 30* 29*  CREATININE 1.03 0.94  CALCIUM 8.9 8.5  MG -- 2.1  PHOS -- --  GLUCOSE 100* 135*    Liver fxn  Lab 09/04/12 0400 09/03/12 2042  AST 21 19  ALT 13 13  ALKPHOS 59 55  BILITOT 0.3 0.3  PROT 6.0 5.7*  ALBUMIN 2.1* 2.0*    coags  Lab 09/03/12 1630  APTT 39*  INR 1.50*    Sepsis markers  Lab 09/03/12 2042  09/03/12 1630  LATICACIDVEN 1.4 --  PROCALCITON -- 0.60    Cardiac markers  Lab 09/04/12 0400 09/03/12 2118 09/03/12 1558  CKTOTAL -- -- --  CKMB -- -- --  TROPONINI <0.30 <0.30 <0.30    BNP  Lab 09/03/12 1558  PROBNP 7946.0*    ABG  Lab 09/04/12 0330 09/03/12 1743  PHART 7.382 7.345*  PCO2ART 47.9* 49.5*  PO2ART 98.1 73.0*  HCO3 27.8* 27.0*  TCO2 29.3 29    CBG trend  Lab 09/04/12 0631 09/03/12 2339 09/03/12 1833 09/03/12 1553  GLUCAP 91 119* 145* 153*    IMAGING: 12/17 CXR: improved edema  ECG:  DIAGNOSES: Principal Problem:  *Diverticulitis Active Problems:  Acute respiratory failure due to pulmonary edema  Paroxysmal AF  Cardiomyopathy  Smoker  Bowel microperforation   ASSESSMENT / PLAN:  PULMONARY  ASSESSMENT: Acute Respiratory Failure  - in setting of abdominal process, Afib with RVR, Cardiogenic pulmonary edema Presumed COPD - no hx of  PFT's, 49 yr smoking hx. Elevated PA pressures noted on ECHO (PAS 60).   Pulm edema  PLAN:   Continue current MV pcxr concern = edema vs aspiration / HCAP, consider neg balance 500 cc pcxr in am  Wean cpap5 ps 5, goal 1 hr, failed eventually, ps increase  CARDIOVASCULAR  ASSESSMENT:  Atrial Fibrilation with RVR - SVT / RVR noted at [facility], rx'd with cardizem initially and changed to amiodarone - unclear if this was related to hypotension.  CAD - noted femoral bruits on exam and evidence of atherosclerotic disease in aorta & coronary arteries.   Mild bump (0.7, 0.9) in troponin with no associated chest pain, BNP mildly elevated.  CHF - EF of 20%, mod LVE, mod MR, mild-mod TR and PASP of 60  PLAN:  -Cardiology Consult -Cont amiodarone, may need bolus further -PRN beta blocker for HR > 115, needed -Aspirin  -cycle biomarker's - all neg -am EKG  RENAL  ASSESSMENT:   Pos balance  PLAN:   -monitor renal function -lasix repeat dosing Assess cvp - 17 Chem in am  kvo  GASTROINTESTINAL  ASSESSMENT:   Diverticulitis with microperforation -     PLAN:   -CCS consult, appreciated, ct in future -NGT to LIWS -PPI ordered -lft in am   HEMATOLOGIC  ASSESSMENT:   No acute issues.   PLAN:  -monitor h/h in am  No plan OR, add sub q hep  INFECTIOUS  ASSESSMENT:   SBO, ? Perforated diverticulitis with associated peritonitis.   ? UTI  - noted mod bacteria on UA, no nitrate R/o HCAP PLAN:   -invance for now, low threshold to change to zosyn / vanc and dc invance Assess sputum Send BC  ENDOCRINE  ASSESSMENT:   No acute issues.     PLAN:   -CBG's Q6 while NPO  NEUROLOGIC  ASSESSMENT:   Pain - in setting of SBO  PLAN:   -Fentanyl gtt, PRN versed pushes for sedtion Avoid benzo  CLINICAL SUMMARY:     I have personally obtained a history, examined the patient, evaluated laboratory and imaging results, formulated the assessment and plan and placed  orders.  CRITICAL CARE: The patient is critically ill with multiple organ systems failure and requires high complexity decision making for assessment and support, frequent evaluation and titration of therapies, application of advanced monitoring technologies and extensive interpretation of multiple databases. Critical Care Time devoted to patient care services described in this note is 30 minutes.    Mcarthur Rossetti. Tyson Alias, MD,  FACP Pgr: [id number] [facility]

## 2012-09-04 NOTE — Progress Notes (Signed)
UR Completed.  Ronnie Harvey [name] [phone] 09/04/2012  

## 2012-09-05 ENCOUNTER — Inpatient Hospital Stay (HOSPITAL_COMMUNITY): Payer: MEDICARE

## 2012-09-05 DIAGNOSIS — I2510 Atherosclerotic heart disease of native coronary artery without angina pectoris: Secondary | ICD-10-CM

## 2012-09-05 LAB — CBC WITH DIFFERENTIAL/PLATELET
Basophils Absolute: 0 10*3/uL (ref 0.0–0.1)
Eosinophils Absolute: 0.1 10*3/uL (ref 0.0–0.7)
Lymphs Abs: 0.5 10*3/uL — ABNORMAL LOW (ref 0.7–4.0)
MCH: 32.3 pg (ref 26.0–34.0)
MCHC: 33.1 g/dL (ref 30.0–36.0)
MCV: 97.7 fL (ref 78.0–100.0)
Monocytes Absolute: 0.7 10*3/uL (ref 0.1–1.0)
Monocytes Relative: 8 % (ref 3–12)
Neutro Abs: 7.5 10*3/uL (ref 1.7–7.7)
Platelets: 322 10*3/uL (ref 150–400)
RDW: 14.2 % (ref 11.5–15.5)
WBC Morphology: INCREASED
WBC: 8.8 10*3/uL (ref 4.0–10.5)

## 2012-09-05 LAB — BASIC METABOLIC PANEL
Chloride: 108 mEq/L (ref 96–112)
GFR calc Af Amer: >90 mL/min (ref >90)
Potassium: 3.2 mEq/L — ABNORMAL LOW (ref 3.5–5.1)

## 2012-09-05 LAB — GLUCOSE, CAPILLARY
Glucose-Capillary: 90 mg/dL (ref 70–99)
Glucose-Capillary: 102 mg/dL — ABNORMAL HIGH (ref 70–99)

## 2012-09-05 LAB — COMPREHENSIVE METABOLIC PANEL
ALT: 13 U/L (ref 0–53)
Albumin: 2 g/dL — ABNORMAL LOW (ref 3.5–5.2)
Alkaline Phosphatase: 50 U/L (ref 39–117)
BUN: 29 mg/dL — ABNORMAL HIGH (ref 6–23)
Chloride: 110 mEq/L (ref 96–112)
Glucose, Bld: 95 mg/dL (ref 70–99)
Potassium: 3.1 mEq/L — ABNORMAL LOW (ref 3.5–5.1)
Sodium: 150 mEq/L — ABNORMAL HIGH (ref 135–145)
Total Bilirubin: 0.3 mg/dL (ref 0.3–1.2)

## 2012-09-05 LAB — POCT I-STAT 3, ART BLOOD GAS (G3+)
Bicarbonate: 30.4 mEq/L — ABNORMAL HIGH (ref 20.0–24.0)
pCO2 arterial: 46.5 mmHg — ABNORMAL HIGH (ref 35.0–45.0)
pH, Arterial: 7.424 (ref 7.350–7.450)
pO2, Arterial: 134 mmHg — ABNORMAL HIGH (ref 80.0–100.0)

## 2012-09-05 LAB — URINE CULTURE

## 2012-09-05 MED ORDER — POTASSIUM CHLORIDE 20 MEQ/15ML (10%) PO LIQD
ORAL | Status: AC
  Administered 2012-09-05: 40 meq
  Filled 2012-09-05: qty 30

## 2012-09-05 MED ORDER — POTASSIUM CHLORIDE 20 MEQ/15ML (10%) PO LIQD
40.0000 meq | ORAL | Status: AC
  Administered 2012-09-05 – 2012-09-06 (×2): 40 meq
  Filled 2012-09-05 (×2): qty 30

## 2012-09-05 MED ORDER — POTASSIUM CHLORIDE 10 MEQ/50ML IV SOLN
10.0000 meq | INTRAVENOUS | Status: AC
  Administered 2012-09-05 (×2): 10 meq via INTRAVENOUS

## 2012-09-05 MED ORDER — INSULIN ASPART 100 UNIT/ML ~~LOC~~ SOLN
0.0000 [IU] | SUBCUTANEOUS | Status: DC
  Administered 2012-09-05 – 2012-09-06 (×2): 1 [IU] via SUBCUTANEOUS

## 2012-09-05 MED ORDER — PIPERACILLIN-TAZOBACTAM 3.375 G IVPB
3.3750 g | Freq: Three times a day (TID) | INTRAVENOUS | Status: DC
  Administered 2012-09-05 – 2012-09-10 (×16): 3.375 g via INTRAVENOUS
  Filled 2012-09-05 (×18): qty 50

## 2012-09-05 MED ORDER — DEXTROSE 5 % IV SOLN
INTRAVENOUS | Status: DC
  Administered 2012-09-05 – 2012-09-10 (×3): via INTRAVENOUS

## 2012-09-05 MED ORDER — POTASSIUM CHLORIDE 10 MEQ/50ML IV SOLN
10.0000 meq | INTRAVENOUS | Status: AC
  Administered 2012-09-05 (×4): 10 meq via INTRAVENOUS
  Filled 2012-09-05 (×3): qty 100

## 2012-09-05 MED ORDER — FUROSEMIDE 10 MG/ML IJ SOLN
40.0000 mg | Freq: Three times a day (TID) | INTRAMUSCULAR | Status: DC
  Administered 2012-09-05 – 2012-09-06 (×3): 40 mg via INTRAVENOUS
  Filled 2012-09-05 (×4): qty 4

## 2012-09-05 NOTE — Progress Notes (Signed)
[facility] ADULT ICU REPLACEMENT PROTOCOL FOR AM LAB REPLACEMENT ONLY  The patient does apply for the [facility] Adult ICU Electrolyte Replacment Protocol based on the criteria listed below:   1. Is GFR >/= 50 ml/min? yes  Patient's GFR today is >90 2. Is urine output >/= 0.5 ml/kg/hr for the last 8 hours? yes Patient's UOP is 0.80 ml/kg/hr 3. Is BUN < 30 mg/dL? yes  Patient's BUN today is 29 4. Abnormal electrolyte(s): K 3.1 5. Ordered repletion with: per protocol 6. If a panic level lab has been reported, has the CCM MD in charge been notified? yes.   Physician:  Dr Pedro Earls, Tracye Szuch A 09/05/2012 5:23 AM

## 2012-09-05 NOTE — Progress Notes (Signed)
INITIAL NUTRITION ASSESSMENT  DOCUMENTATION CODES Per approved criteria  -Non-severe (moderate) malnutrition in the context of chronic illness   INTERVENTION:  If unable to start enteral nutrition (PO diet or TF) within the next 4 days, recommend start TPN for nutrition support.    Once nutrition support is initiated, monitor magnesium, potassium, and phosphorus daily for at least 3 days, MD to replete as needed, as pt is at risk for refeeding syndrome given severe PCM.  NUTRITION DIAGNOSIS: Inadequate oral intake related to altered GI function and inability to eat as evidenced by NPO status.   Goal: Intake to meet >90% of estimated nutrition needs.  Monitor:  Ability to start enteral nutrition, weight trend, labs, vent status.  Reason for Assessment: VDRF  66 y.o. male  Admitting Dx: Diverticulitis with microperforation; Course complicated by AF with RVR, decompensation of respiratory status requiring intubation 12/17. Transferred from [facility] to ICU at [facility] for further care.  ASSESSMENT: Patient is currently intubated on ventilator support.  MV: 7.6 Temp:Temp (24hrs), Avg:98.4 F (36.9 C), Min:97.9 F (36.6 C), Max:99.4 F (37.4 C)   Patient with two loose BM's overnight.  Noted possibility for start trickle TF soon.  NG tube is in place.  Plans for extubation today per discussion with wife.  Per discussion with patient's wife and daughter, patient has been eating poorly for the past 5-6 months and over the past week has only consumed applesauce, soup, and yogurt.  Wife reports that patient has lost ~40 lbs over the past 5-6 months due to poor intake.  Usually eats one meal daily (dinner) and snacks on carrots, celery, and jelly beans the rest of the day.   Pt meets criteria for severe MALNUTRITION in the context of chronic illness as evidenced by 20% weight loss in 6 months and intake < 75 % of estimated energy requirement in > 1 month.   Height: Ht  Readings from Last 1 Encounters:  09/03/12 5\' 4"  (1.626 m)    Weight: Wt Readings from Last 1 Encounters:  09/05/12 153 lb 7 oz (69.6 kg)    Ideal Body Weight: 59.1 kg  % Ideal Body Weight: 118%  Wt Readings from Last 10 Encounters:  09/05/12 153 lb 7 oz (69.6 kg)    Usual Body Weight: ~190 lb  % Usual Body Weight: 80%  BMI:  Body mass index is 26.34 kg/(m^2).  Estimated Nutritional Needs: Kcal: 1600 Protein: 85-95 gm Fluid: 1.6-1.7 L  Skin: no problems noted  Diet Order: NPO  EDUCATION NEEDS: -Education not appropriate at this time   Intake/Output Summary (Last 24 hours) at 09/05/12 1006 Last data filed at 09/05/12 1000  Gross per 24 hour  Intake 1543.13 ml  Output   1210 ml  Net 333.13 ml    Last BM: 12/19   Labs:   Lab 09/05/12 0400 09/04/12 0400 09/03/12 2042  NA 150* 147* 144  K 3.1* 3.7 3.4*  CL 110 108 106  CO2 30 28 28   BUN 29* 30* 29*  CREATININE 0.94 1.03 0.94  CALCIUM 8.3* 8.9 8.5  MG -- -- 2.1  PHOS -- -- --  GLUCOSE 95 100* 135*    CBG (last 3)   Basename 09/05/12 0604 09/05/12 0005 09/04/12 1213  GLUCAP 90 96 119*    Scheduled Meds:   . antiseptic oral rinse  15 mL Mouth Rinse QID  . chlorhexidine  15 mL Mouth Rinse BID  . furosemide  40 mg Intravenous Q8H  . heparin  subcutaneous  5,000 Units Subcutaneous Q8H  . insulin aspart  0-9 Units Subcutaneous Q4H  . pantoprazole (PROTONIX) IV  40 mg Intravenous QHS  . piperacillin-tazobactam (ZOSYN)  IV  3.375 g Intravenous Q8H  . potassium chloride  10 mEq Intravenous Q1 Hr x 4  . potassium chloride  10 mEq Intravenous Q1 Hr x 2    Continuous Infusions:   . amiodarone (NEXTERONE PREMIX) 360 mg/200 mL dextrose 0.5 mg/min (09/05/12 0941)  . dextrose    . fentaNYL infusion INTRAVENOUS 50 mcg/hr (09/05/12 0800)    Past Medical History  Diagnosis Date  . ETOH abuse   . Tobacco abuse     Past Surgical History  Procedure Date  . Neck cyst resection     Joaquin Courts,  RD, LDN, CNSC Pager# [phone] After Hours Pager# [phone]

## 2012-09-05 NOTE — Progress Notes (Signed)
Patient ID: Ronnie Harvey, male   DOB: 05-30-1946, 66 y.o.   MRN: [id number]    Subjective: On vent, awake, has had 2 BM (loose), NAD.  Objective: Vital signs in last 24 hours: Temp:  [98.2 F (36.8 C)-99.4 F (37.4 C)] 98.2 F (36.8 C) (12/19 0404) Pulse Rate:  [75-134] 88  (12/19 0600) Resp:  [16-23] 19  (12/19 0600) BP: (105-142)/(55-99) 132/62 mmHg (12/19 0600) SpO2:  [92 %-98 %] 94 % (12/19 0600) FiO2 (%):  [39.5 %-40.3 %] 39.9 % (12/19 0600) Weight:  [153 lb 7 oz (69.6 kg)] 153 lb 7 oz (69.6 kg) (12/19 0404)    Intake/Output from previous day: 12/18 0701 - 12/19 0700 In: 1426.4 [I.V.:1302.4; IV Piggyback:124] Out: 1250 [Urine:1250] Intake/Output this shift:    General appearance: Remains on vent, NAD Chest: CTA Cardiac: RRR Abdomen: distended, minimal BS, + BM (loose) x 2, NG bilious output Labs: WBC have trended down, H&H stable, Hypokalemia (being repleted) Bun and creatine improved. Lab Results:   Basename 09/05/12 0400 09/04/12 0400  WBC 8.8 9.7  HGB 14.2 14.6  HCT 42.9 45.0  PLT 322 316   BMET  Basename 09/05/12 0400 09/04/12 0400  NA 150* 147*  K 3.1* 3.7  CL 110 108  CO2 30 28  GLUCOSE 95 100*  BUN 29* 30*  CREATININE 0.94 1.03  CALCIUM 8.3* 8.9     Studies/Results: Dg Chest Port 1 View  09/05/2012  *RADIOLOGY REPORT*  Clinical Data: Shortness of breath.  PORTABLE CHEST - 1 VIEW  Comparison: 09/04/2012.  Findings: The endotracheal tube is 2.3 cm above the carina.  The NG tube and right subclavian catheters are stable.  The heart is enlarged but unchanged.  Persistent interstitial and airspace process in the lungs along with bilateral pleural effusions.  IMPRESSION:  1.  Stable support apparatus. 2.  Persistent interstitial and airspace process in the lungs and the small bilateral effusions.   Original Report Authenticated By: Rudie Meyer, M.D.    Portable Chest Xray In Am  09/04/2012  *RADIOLOGY REPORT*  Clinical Data: Evaluate pulmonary  edema, endotracheal tube  PORTABLE CHEST - 1 VIEW  Comparison: 09/03/2012 (multiple examinations); 09/02/2012; 08/30/2012  Findings: Grossly unchanged enlarged cardiac silhouette and mediastinal contours with tortuosity within the thoracic aorta. Atherosclerotic calcifications in the aortic arch.  The pulmonary vasculature remains indistinct.  Grossly unchanged perihilar heterogeneous air space opacities, right greater than left.  No definite pleural effusion or pneumothorax.  Unchanged bones.  IMPRESSION: 1.  Stable positioning of support apparatus.  No pneumothorax. 2.  Grossly unchanged perihilar air space opacities which given rapidity of onset is favored to represent pulmonary edema, though note, underlying infection and/or aspiration is not excluded.   Original Report Authenticated By: Tacey Ruiz, MD    Portable Chest Xray To Verify Ett/ Central Line Placement  09/03/2012  *RADIOLOGY REPORT*  Clinical Data: ET tube placement.  PORTABLE CHEST - 1 VIEW  Comparison: Chest 09/03/2012 at.  Findings: Endotracheal tube is in place with tip just below the clavicular heads in good position.  NG tube courses into the stomach.  Bilateral airspace disease has improved.  No pneumothorax identified.  There appear be small bilateral pleural effusions.  IMPRESSION:  1.  ET tube in good position. 2.  Improved bilateral airspace disease.   Original Report Authenticated By: Holley Dexter, M.D.     Anti-infectives: Anti-infectives     Start     Dose/Rate Route Frequency Ordered Stop   09/03/12 1615  ertapenem (INVANZ) 1 g in sodium chloride 0.9 % 50 mL IVPB        1 g 100 mL/hr over 30 Minutes Intravenous Every 24 hours 09/03/12 1603            Assessment/Plan: Patient Active Problem List  Diagnosis  . SBO (small bowel obstruction)  . Acute respiratory failure due to pulmonary edema  . Paroxysmal AF  . COPD (chronic obstructive pulmonary disease)  . CAD (coronary artery disease)  . Cardiomyopathy   . Diverticulitis  . Pulmonary edema  . Smoker  . Bowel microperforation   Transfer in for VDRF, CHF and pulm edema Sigmoid diverticulitis with microperforation on presentation to outside hospital Continue IV abx and repeat CT in a couple of days    LOS: 2 days    [facility] Pager # [phone]  09/05/2012

## 2012-09-05 NOTE — H&P (Signed)
PULMONARY  / CRITICAL CARE MEDICINE  Name: Ronnie Harvey MRN: [id number] DOB: [birth year]    LOS: 2  REFERRING MD :  [facility]  CHIEF COMPLAINT:  Acute Respiratory Failure / SBO  BRIEF PATIENT DESCRIPTION: 25 M with no PMH adm to [facility] on 12/14 with abd pain.  CT abd > diverticulitis with microperforation.  Course complicated by AFRVR, decompensation of respiratory status requiring intubation 12/17.  Tx to ICU at [facility] for further care.    LINES / TUBES: 12/17 ETT>>> 12/17 R [place] TLC>>>  CULTURES: 12/14 UA>>> 12/18 BC x 2>>> 12/18 sputum>>>  ANTIBIOTICS: 12/15 Zosyn>>>12/17 12/17 Invanz>>>12/19 12/19 zosyn>>>  SIGNIFICANT EVENTS:  12/14 - Admit to [facility] with SBO 12/15 - SVT / Afib with RVR 12/15 Echo - LVEF 20-25%, diffuse HK with some variation (post-lateral worse than anterior) 12/17 - early am, decompensated requiring intubation, central line placement, tx to [facility] 12/18- failed weaning 12/19- mild low grade temp,. BM noted, edema, increased Na  LEVEL OF CARE:  ICU PRIMARY SERVICE:  PCCM CONSULTANTS:  CCS CODE STATUS:  Full Code DIET:  NPO DVT Px:  SCD's GI Px:  Protonix  INTERVAL HISTORY:  Pos balance, Na rising  VITAL SIGNS: Temp:  [97.9 F (36.6 C)-99.4 F (37.4 C)] 97.9 F (36.6 C) (12/19 0807) Pulse Rate:  [75-134] 88  (12/19 0600) Resp:  [18-23] 19  (12/19 0600) BP: (105-142)/(55-99) 132/62 mmHg (12/19 0600) SpO2:  [92 %-98 %] 94 % (12/19 0600) FiO2 (%):  [39.5 %-40.3 %] 39.9 % (12/19 0600) Weight:  [69.6 kg (153 lb 7 oz)] 69.6 kg (153 lb 7 oz) (12/19 0404) HEMODYNAMICS: CVP:  [12 mmHg-16 mmHg] 15 mmHg VENTILATOR SETTINGS: Vent Mode:  [-] PRVC FiO2 (%):  [39.5 %-40.3 %] 39.9 % Set Rate:  [18 bmp] 18 bmp Vt Set:  [500 mL] 500 mL PEEP:  [5 cmH20] 5 cmH20 Plateau Pressure:  [14 cmH20-15 cmH20] 15 cmH20  INTAKE / OUTPUT: Intake/Output      12/18 0701 - 12/19 0700 12/19 0701 - 12/20 0700   I.V. (mL/kg) 1302.4  (18.7)    Other     NG/GT     IV Piggyback 124    Total Intake(mL/kg) 1426.4 (20.5)    Urine (mL/kg/hr) 1250 (0.7)    Emesis/NG output     Total Output 1250    Net +176.4         Stool Occurrence 1 x      PHYSICAL EXAMINATION: General:  wdwn adult male on vent Neuro:  Sedate, rass 1, follows commands HEENT:  jvd wnl or lower Cardiovascular:  s1s2 rrr, no m/r/g, no jvd Lungs: coarse bilateral, some crackles Abdomen:  Distended, tympanic, nontender, bs hypo, no r/g, softer examination Musculoskeletal:  No acute deformities Skin:  Warm/dry, no overt edema  LABS: Cbc  Lab 09/05/12 0400 09/04/12 0400 09/03/12 2042  WBC 8.8 -- --  HGB 14.2 14.6 13.9  HCT 42.9 45.0 42.9  PLT 322 316 284    Chemistry   Lab 09/05/12 0400 09/04/12 0400 09/03/12 2042  NA 150* 147* 144  K 3.1* 3.7 3.4*  CL 110 108 106  CO2 30 28 28   BUN 29* 30* 29*  CREATININE 0.94 1.03 0.94  CALCIUM 8.3* 8.9 8.5  MG -- -- 2.1  PHOS -- -- --  GLUCOSE 95 100* 135*    Liver fxn  Lab 09/05/12 0400 09/04/12 0400 09/03/12 2042  AST 29 21 19   ALT 13 13 13  ALKPHOS 50 59 55  BILITOT 0.3 0.3 0.3  PROT 5.7* 6.0 5.7*  ALBUMIN 2.0* 2.1* 2.0*    coags  Lab 09/05/12 0400 09/03/12 1630  APTT -- 39*  INR 1.42 1.50*    Sepsis markers  Lab 09/03/12 2042 09/03/12 1630  LATICACIDVEN 1.4 --  PROCALCITON -- 0.60    Cardiac markers  Lab 09/04/12 0400 09/03/12 2118 09/03/12 1558  CKTOTAL -- -- --  CKMB -- -- --  TROPONINI <0.30 <0.30 <0.30    BNP  Lab 09/03/12 1558  PROBNP 7946.0*    ABG  Lab 09/04/12 0330 09/03/12 1743  PHART 7.382 7.345*  PCO2ART 47.9* 49.5*  PO2ART 98.1 73.0*  HCO3 27.8* 27.0*  TCO2 29.3 29    CBG trend  Lab 09/05/12 0604 09/05/12 0005 09/04/12 1213 09/04/12 0631 09/03/12 2339  GLUCAP 90 96 119* 91 119*    IMAGING: 12/19-0 smaller lung volumes, some crowding, edema  ECG:  DIAGNOSES: Principal Problem:  *Diverticulitis Active Problems:  Acute respiratory  failure due to pulmonary edema  Paroxysmal AF  Cardiomyopathy  Smoker  Bowel microperforation   ASSESSMENT / PLAN:  PULMONARY  ASSESSMENT: Acute Respiratory Failure  - in setting of abdominal process, Afib with RVR, Cardiogenic pulmonary edema Presumed COPD - no hx of PFT's, 49 yr smoking hx. Elevated PA pressures noted on ECHO (PAS 60).   Pulm edema  PLAN:   Continue current MV consider neg balance and correction Na Weaning cpap5 ps 5, goal 2 hrs, assess abg, rsbi pcxr in am   CARDIOVASCULAR  ASSESSMENT:  Atrial Fibrilation with RVR - SVT / RVR noted at [facility], rx'd with cardizem initially and changed to amiodarone - unclear if this was related to hypotension.  CAD - noted femoral bruits on exam and evidence of atherosclerotic disease in aorta & coronary arteries.   Mild bump (0.7, 0.9) in troponin with no associated chest pain, BNP mildly elevated.  CHF - EF of 20%, mod LVE, mod MR, mild-mod TR and PASP of 60  PLAN:  -Cardiology following -Cont amiodarone, per cards -PRN beta blocker for HR > 115, needed -Aspirin -cvp 15, lasix increase as Na free water increase also  RENAL  ASSESSMENT:   Pos balance again slight, pulm edema, hypokalemia, hypernatremia  PLAN:   -cvp up, lasix to q8h -d5w at 40 for na -chem in am bmet in pm for na  GASTROINTESTINAL  ASSESSMENT:   Diverticulitis with microperforation -     PLAN:   -CCS consult, ct in future -NGT to LIWS, can we consider trickle TF? -PPI -if at day 7 no feeds will start TNA  HEMATOLOGIC  ASSESSMENT:   cvt prevention  PLAN:  -sub q hep, tolerated Cbc in am   INFECTIOUS  ASSESSMENT:   SBO, ? Perforated diverticulitis with associated peritonitis.   ? UTI  - noted mod bacteria on UA, no nitrate R/o HCAP Multiple allergies PLAN:   -fever slight, residual infiltrates, nosocomial exposure, dc invanc (does not cover psdo) Add Imi, establish stop date when repeat CT done  ENDOCRINE  ASSESSMENT:    No acute issues.     PLAN:   -CBG's Q4 addition  NEUROLOGIC  ASSESSMENT:   Pain - in setting of SBO  PLAN:   -Fentanyl gtt, PRN versed pushes for sedtion Avoid benzo when able upright  CLINICAL SUMMARY:    I have personally obtained a history, examined the patient, evaluated laboratory and imaging results, formulated the assessment and plan and placed orders. Wife ,  daughter updatded 12/19 CRITICAL CARE: The patient is critically ill with multiple organ systems failure and requires high complexity decision making for assessment and support, frequent evaluation and titration of therapies, application of advanced monitoring technologies and extensive interpretation of multiple databases. Critical Care Time devoted to patient care services described in this note is 30 minutes.    Mcarthur Rossetti. Tyson Alias, MD, FACP Pgr: [phone] [facility]

## 2012-09-05 NOTE — Procedures (Signed)
Extubation Procedure Note  Patient Details:   Name: Ronnie Harvey DOB: May 17, 1946 MRN: [id number]   Airway Documentation:  Airway 7.5 mm (Active)  Secured at (cm) 22 cm 09/05/2012  9:09 AM  Measured From Lips 09/05/2012  9:09 AM  Secured Location Center 09/05/2012  9:09 AM  Secured By [employer] 09/05/2012  9:09 AM  Tube Holder Repositioned Yes 09/05/2012  9:09 AM  Cuff Pressure (cm H2O) 26 cm H2O 09/05/2012  3:45 AM  Site Condition Dry 09/05/2012  8:00 AM    Evaluation  O2 sats: stable throughout Complications: No apparent complications Patient did tolerate procedure well. Bilateral Breath Sounds: Rhonchi Suctioning: Oral;Airway Yes  RT suctioned mouth and down ett tube.  Patient was able to breathe around a deflated cuff.  Extubated patient per Dr Tyson Alias.  Patient placed on 4L [place] and tolerating well at this time.  BBS are clear and diminished no stridor noted at this time. RT will continue to monitor.  [name] 09/05/2012, 10:42 AM

## 2012-09-05 NOTE — Progress Notes (Signed)
Pharmacist Heart Failure Core Measure Documentation  Assessment: Ronnie Harvey has an EF documented as 20-25% on 12/15 by Echo (done at [facility]).  Rationale: Heart failure patients with left ventricular systolic dysfunction (LVSD) and an EF < 40% should be prescribed an angiotensin converting enzyme inhibitor (ACEI) or angiotensin receptor blocker (ARB) at discharge unless a contraindication is documented in the medical record.  This patient is not currently on an ACEI or ARB for HF.  This note is being placed in the record in order to provide documentation that a contraindication to the use of these agents is present for this encounter.  ACE Inhibitor or Angiotensin Receptor Blocker is contraindicated (specify all that apply)  []   ACEI allergy AND ARB allergy []   Angioedema []   Moderate or severe aortic stenosis []   Hyperkalemia [x]   Hypotension []   Renal artery stenosis []   Worsening renal function, preexisting renal disease or dysfunction  Harland German, Pharm D 09/05/2012 1:40 PM

## 2012-09-05 NOTE — Progress Notes (Signed)
Patient interviewed and examined, agree with NP note above. He is alert and communicative on the vent today. He denies abdominal pain. Has had several bowel movements. Abdomen mildly distended but nontender. He does not appear to have any worsening of his diverticulitis. Plan repeat CT scan in a couple of days. Mariella Saa MD, FACS  09/05/2012 11:58 AM

## 2012-09-05 NOTE — Progress Notes (Signed)
Dr. Darrick Penna notified of 11 beat run of VT. Pt asymptomatic. No new orders. Will continue to assess and monitor. Will draw labs early at 3AM. Hardcopy of EKG placed in patients chart.

## 2012-09-06 ENCOUNTER — Inpatient Hospital Stay (HOSPITAL_COMMUNITY): Payer: MEDICARE

## 2012-09-06 DIAGNOSIS — R7881 Bacteremia: Secondary | ICD-10-CM | POA: Diagnosis present

## 2012-09-06 DIAGNOSIS — E870 Hyperosmolality and hypernatremia: Secondary | ICD-10-CM

## 2012-09-06 LAB — GLUCOSE, CAPILLARY
Glucose-Capillary: 125 mg/dL — ABNORMAL HIGH (ref 70–99)
Glucose-Capillary: 128 mg/dL — ABNORMAL HIGH (ref 70–99)
Glucose-Capillary: 150 mg/dL — ABNORMAL HIGH (ref 70–99)

## 2012-09-06 LAB — COMPREHENSIVE METABOLIC PANEL
ALT: 18 U/L (ref 0–53)
AST: 38 U/L — ABNORMAL HIGH (ref 0–37)
Albumin: 2.2 g/dL — ABNORMAL LOW (ref 3.5–5.2)
Alkaline Phosphatase: 55 U/L (ref 39–117)
GFR calc Af Amer: >90 mL/min (ref >90)
Glucose, Bld: 108 mg/dL — ABNORMAL HIGH (ref 70–99)
Potassium: 3.6 mEq/L (ref 3.5–5.1)
Sodium: 151 mEq/L — ABNORMAL HIGH (ref 135–145)
Total Protein: 6 g/dL (ref 6.0–8.3)

## 2012-09-06 LAB — CBC WITH DIFFERENTIAL/PLATELET
Basophils Absolute: 0.1 10*3/uL (ref 0.0–0.1)
HCT: 43.8 % (ref 39.0–52.0)
Hemoglobin: 14.5 g/dL (ref 13.0–17.0)
Lymphocytes Relative: 12 % (ref 12–46)
Monocytes Absolute: 0.8 10*3/uL (ref 0.1–1.0)
Monocytes Relative: 8 % (ref 3–12)
Neutro Abs: 8 10*3/uL — ABNORMAL HIGH (ref 1.7–7.7)
RDW: 13.8 % (ref 11.5–15.5)
WBC: 10.1 10*3/uL (ref 4.0–10.5)

## 2012-09-06 MED ORDER — IOHEXOL 300 MG/ML  SOLN
100.0000 mL | Freq: Once | INTRAMUSCULAR | Status: AC | PRN
  Administered 2012-09-06: 100 mL via INTRAVENOUS

## 2012-09-06 MED ORDER — POTASSIUM CHLORIDE 10 MEQ/50ML IV SOLN
10.0000 meq | INTRAVENOUS | Status: AC
  Administered 2012-09-06 (×3): 10 meq via INTRAVENOUS
  Filled 2012-09-06: qty 50
  Filled 2012-09-06: qty 100

## 2012-09-06 MED ORDER — IOHEXOL 300 MG/ML  SOLN
20.0000 mL | INTRAMUSCULAR | Status: AC
  Administered 2012-09-06 (×2): 20 mL via ORAL

## 2012-09-06 MED ORDER — WHITE PETROLATUM GEL
Status: AC
  Administered 2012-09-06: 10:00:00
  Filled 2012-09-06: qty 5

## 2012-09-06 MED ORDER — POTASSIUM CHLORIDE 20 MEQ/15ML (10%) PO LIQD
ORAL | Status: AC
  Administered 2012-09-06: 40 meq
  Filled 2012-09-06: qty 30

## 2012-09-06 MED ORDER — BIOTENE DRY MOUTH MT LIQD
15.0000 mL | Freq: Two times a day (BID) | OROMUCOSAL | Status: DC
  Administered 2012-09-06 – 2012-09-11 (×11): 15 mL via OROMUCOSAL

## 2012-09-06 MED ORDER — VANCOMYCIN HCL 1000 MG IV SOLR
750.0000 mg | Freq: Two times a day (BID) | INTRAVENOUS | Status: DC
  Administered 2012-09-06 – 2012-09-08 (×4): 750 mg via INTRAVENOUS
  Filled 2012-09-06 (×5): qty 750

## 2012-09-06 MED ORDER — CHLORHEXIDINE GLUCONATE 0.12 % MT SOLN
15.0000 mL | Freq: Two times a day (BID) | OROMUCOSAL | Status: DC
Start: 2012-09-05 — End: 2012-09-11
  Administered 2012-09-06 – 2012-09-11 (×10): 15 mL via OROMUCOSAL
  Filled 2012-09-06 (×13): qty 15

## 2012-09-06 MED ORDER — INFLUENZA VIRUS VACC SPLIT PF IM SUSP: 0.5000 mL | INTRAMUSCULAR | Status: DC | PRN

## 2012-09-06 MED ORDER — PNEUMOCOCCAL VAC POLYVALENT 25 MCG/0.5ML IJ INJ
0.5000 mL | INJECTION | INTRAMUSCULAR | Status: AC
  Administered 2012-09-07: 0.5 mL via INTRAMUSCULAR
  Filled 2012-09-06: qty 0.5

## 2012-09-06 MED ORDER — POTASSIUM CHLORIDE 10 MEQ/50ML IV SOLN
10.0000 meq | Freq: Once | INTRAVENOUS | Status: AC
  Administered 2012-09-06: 10 meq via INTRAVENOUS
  Filled 2012-09-06: qty 50

## 2012-09-06 NOTE — Progress Notes (Signed)
Patient ID: Ronnie Harvey, male   DOB: 12-Nov-1945, 66 y.o.   MRN: [id number]    Subjective: Feeling much better today. He has a little bit of left-sided chest pain and left upper quadrant abdominal pain that is intermittent. No nausea. Has had bowel movements.  Objective: Vital signs in last 24 hours: Temp:  [97.1 F (36.2 C)-98.3 F (36.8 C)] 98.3 F (36.8 C) (12/20 1412) Pulse Rate:  [81-125] 100  (12/20 1412) Resp:  [18-30] 18  (12/20 1412) BP: (113-142)/(65-103) 122/73 mmHg (12/20 1412) SpO2:  [90 %-97 %] 92 % (12/20 1412) Weight:  [145 lb 11.6 oz (66.1 kg)] 145 lb 11.6 oz (66.1 kg) (12/20 0400) Last BM Date: 09/06/12  Intake/Output from previous day: 12/19 0701 - 12/20 0700 In: 1477.3 [I.V.:1039.3; NG/GT:180; IV Piggyback:258] Out: 2100 [Urine:1900; Emesis/NG output:200] Intake/Output this shift: Total I/O In: 238.5 [I.V.:163.5; IV Piggyback:75] Out: -   General appearance: alert and no distress GI: abdomen is mildly distended but soft and without appreciable tenderness.  Lab Results:   Basename 09/06/12 0420 09/05/12 0400  WBC 10.1 8.8  HGB 14.5 14.2  HCT 43.8 42.9  PLT 338 322   BMET  Basename 09/06/12 0420 09/05/12 1735  NA 151* 151*  K 3.6 3.2*  CL 108 108  CO2 34* 31  GLUCOSE 108* 120*  BUN 23 27*  CREATININE 0.94 0.91  CALCIUM 8.4 8.6     Studies/Results: Ct Abdomen Pelvis W Contrast  09/06/2012  *RADIOLOGY REPORT*  Clinical Data: Abdominal distension with nausea.  Diverticulitis with micro perforation.  Fever respiratory distress.  Small bowel obstruction.  Alcohol and tobacco abuse.  CT ABDOMEN AND PELVIS WITH CONTRAST  Technique:  Multidetector CT imaging of the abdomen and pelvis was performed following the standard protocol during bolus administration of intravenous contrast.  Contrast: OMNIPAQUE IOHEXOL 300 MG/ML  SOLN  Comparison: 08/31/2012.  Findings: Small pleural effusions and basilar atelectasis.  Nasogastric tube in place tip in  region of the gastric antrum.  Previously noted free intraperitoneal air has cleared.  Thickened abnormal appearing sigmoid colon may represent changes of diverticulosis/diverticulitis.  Underlying mass not excluded.  Increased amount of fluid throughout the abdomen pelvis.  Although some of this may be caused by ascites related to cirrhosis, result of peritonitis not excluded given the findings of prior bowel perforation.  Fluid collections within the lower abdomen and pelvis demonstrate mild peripheral enhancement.  Although no well defined abscess currently, the patient may be at risk for developing such.  Small bowel loops with slightly thickened folds and with dilated segment superior to the transverse colon may reflect changes of inflammation/reactive changes  No bowel containing hernia identified.  Fat containing right inguinal hernia.  Slightly lobulated appearance of liver may indicate cirrhosis with surrounding varices.  Hypodensity of the liver adjacent to the falciform ligament spanning over 2.8 cm.  This is new or more conspicuous when compared to the prior examination.  Given the surrounding findings, early abscess not excluded and close attention to this recommended.  Ascites surrounding the spleen with indentation and superior margin.  Right adrenal gland 1.4 cm nodule cannot be confirmed as an adenoma and is without change.  Hyperplasia remainder of the adrenal glands.  Left renal 6.2 cm cyst.  Other renal low density lesions more notable on the right may be cysts although some too small to characterize but without change.  Gallbladder is full without calcified gallstone.  No focal pancreatic lesion.  Duodenal diverticulum at pancreatic head  level is noted.  Coronary artery calcifications.  Prominent irregular plaque throughout the abdominal aorta with ectasia measuring up to 2.8 cm. Plaque iliac arteries with narrowing.  Plaque superior mesenteric artery with narrowing.  Poor delineation inferior  mesenteric artery.  Third spacing of fluid.  This is noted in the subcutaneous region as well as involving the fascial planes bilaterally.  Scoliosis and degenerative changes most notable degenerative changes lower lumbar region.  Bilateral hip joint degenerative changes.  IMPRESSION: Increase in amount of abdominal and pelvic fluid which demonstrates peripheral enhancement suggesting peritonitis although some of fluid may be related to patient's liver disease.  Presently no well- defined drainable abscess although the patient may be at risk for development of such.  Change in appearance of the anterior aspect of the medial segment left lobe liver where hypodensity spanning over 2.8 cm is noted or more conspicuous than on the prior exam.  Given the surrounding findings, small liver abscess not entirely excluded.  Close attention to this on follow-up.  Thickened appearance of the sigmoid colon may indicate changes of diverticulosis/diverticulitis.  Underlying mass not excluded.  Regions of small bowel have slightly thickened folds and other areas appears slightly dilated which may indicate inflammatory/reactive changes.  Prominent atherosclerotic type changes as detailed above.  1.4 cm right adrenal lesion unchanged.  This cannot be confirmed as an adenoma on the present exam.  Renal cystic structures.  Some of the low density structures cannot be confirmed as simple cysts on the present examination but without change.  This has been made a PRA call report utilizing dashboard call feature.   Original Report Authenticated By: Lacy Duverney, M.D.    Dg Chest Port 1 View  09/06/2012  *RADIOLOGY REPORT*  Clinical Data: Edema  PORTABLE CHEST - 1 VIEW  Comparison: Yesterday  Findings: Endotracheal tube removed.  NG tube stable.  Right subclavian venous catheters stable.  Upper normal heart size. Bibasilar airspace disease improved.  Vascular congestion improved. No pneumothorax.  IMPRESSION: Extubated.  Improved  bilateral airspace disease and vascular congestion.   Original Report Authenticated By: Jolaine Click, M.D.    Dg Chest Port 1 View  09/05/2012  *RADIOLOGY REPORT*  Clinical Data: Shortness of breath.  PORTABLE CHEST - 1 VIEW  Comparison: 09/04/2012.  Findings: The endotracheal tube is 2.3 cm above the carina.  The NG tube and right subclavian catheters are stable.  The heart is enlarged but unchanged.  Persistent interstitial and airspace process in the lungs along with bilateral pleural effusions.  IMPRESSION:  1.  Stable support apparatus. 2.  Persistent interstitial and airspace process in the lungs and the small bilateral effusions.   Original Report Authenticated By: Rudie Meyer, M.D.     Anti-infectives: Anti-infectives     Start     Dose/Rate Route Frequency Ordered Stop   09/06/12 1500   vancomycin (VANCOCIN) 750 mg in sodium chloride 0.9 % 150 mL IVPB        750 mg 150 mL/hr over 60 Minutes Intravenous Every 12 hours 09/06/12 1352     09/05/12 1100  piperacillin-tazobactam (ZOSYN) IVPB 3.375 g       3.375 g 12.5 mL/hr over 240 Minutes Intravenous Every 8 hours 09/05/12 0907     09/03/12 1615   ertapenem (INVANZ) 1 g in sodium chloride 0.9 % 50 mL IVPB  Status:  Discontinued        1 g 100 mL/hr over 30 Minutes Intravenous Every 24 hours 09/03/12 1603 09/05/12 0906  Assessment/Plan: Diverticulitis with microperforation and subsequent pulmonary edema and ventilator-dependent respiratory failure. He continues to improve on all fronts. Abdomen is benign and CT shows only some thickening of the sigmoid without complication. I agree with starting diet and continuing IV antibiotics and observation.    LOS: 3 days    Leilanee Righetti T 09/06/2012

## 2012-09-06 NOTE — Progress Notes (Signed)
PULMONARY  / CRITICAL CARE MEDICINE  Name: Ronnie Harvey MRN: [id number] DOB: 08-Apr-1946    LOS: 3  REFERRING MD :  [facility]  CHIEF COMPLAINT:  Acute Respiratory Failure / SBO  BRIEF PATIENT DESCRIPTION: 29 M with no PMH adm to [facility] on 12/14 with abd pain.  CT abd > diverticulitis with microperforation.  Course complicated by AFRVR, decompensation of respiratory status requiring intubation 12/17.  Tx to ICU at [facility] for further care.    LINES / TUBES: 12/17 ETT >> 12/19 12/17 R [place] TLC >>  CULTURES: 12/17 UA>> NEG 12/18 sputum>> NEG 12/18 BC x 2>> 1/2 GPC c;usters >>   ANTIBIOTICS: 12/15 Zosyn>>>12/17 12/17 Invanz>>>12/19 12/19 zosyn >>   SIGNIFICANT EVENTS:  12/14 - Admit to [facility] with SBO 12/15 - SVT / Afib with RVR 12/15 Echo - LVEF 20-25%, diffuse HK with some variation (post-lateral worse than anterior) 12/17 - early am, decompensated requiring intubation, central line placement, tx to [facility]    INTERVAL HISTORY:  Looks good extubated. Up in chair. No distress. No new complaints  VITAL SIGNS: Temp:  [97.1 F (36.2 C)-98.3 F (36.8 C)] 98.3 F (36.8 C) (12/20 1412) Pulse Rate:  [81-125] 100  (12/20 1412) Resp:  [18-30] 18  (12/20 1412) BP: (113-142)/(65-103) 122/73 mmHg (12/20 1412) SpO2:  [90 %-97 %] 92 % (12/20 1412) Weight:  [66.1 kg (145 lb 11.6 oz)] 66.1 kg (145 lb 11.6 oz) (12/20 0400) HEMODYNAMICS:   VENTILATOR SETTINGS:    INTAKE / OUTPUT: Intake/Output      12/19 0701 - 12/20 0700 12/20 0701 - 12/21 0700   I.V. (mL/kg) 1039.3 (15.7) 163.5 (2.5)   NG/GT 180    IV Piggyback 258 75   Total Intake(mL/kg) 1477.3 (22.3) 238.5 (3.6)   Urine (mL/kg/hr) 1900 (1.2)    Emesis/NG output 200    Total Output 2100    Net -622.7 +238.5        Stool Occurrence 5 x      PHYSICAL EXAMINATION: General:  NAD Neuro:  Cognition intact, no focal deficts HEENT:  WNL Cardiovascular: RRR s M Lungs: clear  anteriorly Abdomen: Soft, NT, + BS Musculoskeletal:  No edema   LABS: Cbc  Lab 09/06/12 0420 09/05/12 0400 09/04/12 0400  WBC 10.1 -- --  HGB 14.5 14.2 14.6  HCT 43.8 42.9 45.0  PLT 338 322 316    Chemistry   Lab 09/06/12 0420 09/05/12 1735 09/05/12 0400 09/03/12 2042  NA 151* 151* 150* --  K 3.6 3.2* 3.1* --  CL 108 108 110 --  CO2 34* 31 30 --  BUN 23 27* 29* --  CREATININE 0.94 0.91 0.94 --  CALCIUM 8.4 8.6 8.3* --  MG 1.9 -- -- 2.1  PHOS 2.5 -- -- --  GLUCOSE 108* 120* 95 --    Liver fxn  Lab 09/06/12 0420 09/05/12 0400 09/04/12 0400  AST 38* 29 21  ALT 18 13 13   ALKPHOS 55 50 59  BILITOT 0.4 0.3 0.3  PROT 6.0 5.7* 6.0  ALBUMIN 2.2* 2.0* 2.1*    coags  Lab 09/05/12 0400 09/03/12 1630  APTT -- 39*  INR 1.42 1.50*    Sepsis markers  Lab 09/03/12 2042 09/03/12 1630  LATICACIDVEN 1.4 --  PROCALCITON -- 0.60    Cardiac markers  Lab 09/04/12 0400 09/03/12 2118 09/03/12 1558  CKTOTAL -- -- --  CKMB -- -- --  TROPONINI <0.30 <0.30 <0.30    BNP  Lab 09/03/12  1558  PROBNP 7946.0*    ABG  Lab 09/05/12 1026 09/04/12 0330 09/03/12 1743  PHART 7.424 7.382 7.345*  PCO2ART 46.5* 47.9* 49.5*  PO2ART 134.0* 98.1 73.0*  HCO3 30.4* 27.8* 27.0*  TCO2 32 29.3 29    CBG trend  Lab 09/06/12 1223 09/06/12 0944 09/06/12 0341 09/06/12 0019 09/05/12 2036  GLUCAP 128* 125* 112* 108* 121*    CXR: CM, mild edema  ECG:  DIAGNOSES: Principal Problem:  *Diverticulitis Active Problems:  Bowel microperforation  Acute respiratory failure due to pulmonary edema  Paroxysmal AF  Cardiomyopathy  Smoker 1/2 "+" blood culture - likely contaminant Hypernatremia  PLAN: Transfer to Tele [facility] to assume care as of 12/21 AM and PCCM to sign off Cont abx as above F/U "+" blood cx and treat accordingly Cards managing AF and cardiomyopathy CCS following for peritonitis  Enteral diet started 12/20 - advance as tolerated Cont free water  repletion  Discussed with Dr Butler Denmark who has agreed to assume care as of 12/21 AM  Billy Fischer, MD ; [facility] service Mobile [phone].  After 5:30 PM or weekends, call [phone]

## 2012-09-06 NOTE — Progress Notes (Signed)
ANTIBIOTIC CONSULT NOTE - INITIAL  Pharmacy Consult for vancomycin Indication: GPC in clusters  No Known Allergies  Patient Measurements: Height: 5\' 4"  (162.6 cm) Weight: 145 lb 11.6 oz (66.1 kg) IBW/kg (Calculated) : 59.2   Vital Signs: Temp: 97.1 F (36.2 C) (12/20 1250) Temp src: Oral (12/20 1250) BP: 138/68 mmHg (12/20 1200) Pulse Rate: 87  (12/20 1200) Intake/Output from previous day: 12/19 0701 - 12/20 0700 In: 1477.3 [I.V.:1039.3; NG/GT:180; IV Piggyback:258] Out: 2100 [Urine:1900; Emesis/NG output:200] Intake/Output from this shift: Total I/O In: 238.5 [I.V.:163.5; IV Piggyback:75] Out: -   Labs:  Basename 09/06/12 0420 09/05/12 1735 09/05/12 0400 09/04/12 0400  WBC 10.1 -- 8.8 9.7  HGB 14.5 -- 14.2 14.6  PLT 338 -- 322 316  LABCREA -- -- -- --  CREATININE 0.94 0.91 0.94 --   Estimated Creatinine Clearance: 63.9 ml/min (by C-G formula based on Cr of 0.94). No results found for this basename: VANCOTROUGH:2,VANCOPEAK:2,VANCORANDOM:2,GENTTROUGH:2,GENTPEAK:2,GENTRANDOM:2,TOBRATROUGH:2,TOBRAPEAK:2,TOBRARND:2,AMIKACINPEAK:2,AMIKACINTROU:2,AMIKACIN:2, in the last 72 hours   Microbiology: Recent Results (from the past 720 hour(s))  MRSA PCR SCREENING     Status: Normal   Collection Time   09/03/12  3:24 PM      Component Value Range Status Comment   MRSA by PCR NEGATIVE  NEGATIVE Final   URINE CULTURE     Status: Normal   Collection Time   09/03/12  9:15 PM      Component Value Range Status Comment   Specimen Description URINE, CATHETERIZED   Final    Special Requests NONE   Final    Culture  Setup Time 09/03/2012 22:03   Final    Colony Count NO GROWTH   Final    Culture NO GROWTH   Final    Report Status 09/05/2012 FINAL   Final   CULTURE, RESPIRATORY     Status: Normal   Collection Time   09/04/12 12:51 PM      Component Value Range Status Comment   Specimen Description ENDOTRACHEAL   Final    Special Requests Normal   Final    Gram Stain     Final    Value: FEW WBC PRESENT, PREDOMINANTLY MONONUCLEAR     NO SQUAMOUS EPITHELIAL CELLS SEEN     NO ORGANISMS SEEN   Culture FEW CANDIDA ALBICANS   Final    Report Status 09/06/2012 FINAL   Final   CULTURE, BLOOD (ROUTINE X 2)     Status: Normal (Preliminary result)   Collection Time   09/04/12  3:50 PM      Component Value Range Status Comment   Specimen Description BLOOD LEFT ARM   Final    Special Requests BOTTLES DRAWN AEROBIC AND ANAEROBIC 10CC   Final    Culture  Setup Time 09/05/2012 00:12   Final    Culture     Final    Value:        BLOOD CULTURE RECEIVED NO GROWTH TO DATE CULTURE WILL BE HELD FOR 5 DAYS BEFORE ISSUING A FINAL NEGATIVE REPORT   Report Status PENDING   Incomplete   CULTURE, BLOOD (ROUTINE X 2)     Status: Normal (Preliminary result)   Collection Time   09/04/12  4:00 PM      Component Value Range Status Comment   Specimen Description BLOOD LEFT ARM   Final    Special Requests BOTTLES DRAWN AEROBIC AND ANAEROBIC 10CC   Final    Culture  Setup Time 09/05/2012 00:10   Final    Culture  Final    Value: GRAM POSITIVE COCCI IN CLUSTERS     Note: Gram Stain Report Called to,Read Back By and Verified With: ALEX SHYSHKO ON 09/05/12 AT 11:06P BY WILEJ   Report Status PENDING   Incomplete     Medical History: Past Medical History  Diagnosis Date  . ETOH abuse   . Tobacco abuse     Medications:  Anti-infectives     Start     Dose/Rate Route Frequency Ordered Stop   09/06/12 1500   vancomycin (VANCOCIN) 750 mg in sodium chloride 0.9 % 150 mL IVPB        750 mg 150 mL/hr over 60 Minutes Intravenous Every 12 hours 09/06/12 1352     09/05/12 1100  piperacillin-tazobactam (ZOSYN) IVPB 3.375 g       3.375 g 12.5 mL/hr over 240 Minutes Intravenous Every 8 hours 09/05/12 0907     09/03/12 1615   ertapenem (INVANZ) 1 g in sodium chloride 0.9 % 50 mL IVPB  Status:  Discontinued        1 g 100 mL/hr over 30 Minutes Intravenous Every 24 hours 09/03/12 1603 09/05/12  0906         Assessment: 66 yom transferred from [facility] continues on zosyn and previously on ertapenem. Gram positive cocci in clusters reported in 1/2 blood cxs. Will add vancomycin until cultures finalize.   12/17 invanz> 12/19 12/19 zosyn>> 12/20 Vanc>>  12/18 resp- few candida 12/18 blood 1/2 - GPC 12/17 urine- neg No micro done at [facility] (called 12/18)  Goal of Therapy:  Vancomycin trough level 15-20 mcg/ml  Plan:  1. Vancomycin 750mg  IV Q12H 2. F/u renal fxn, C&S, clinical status and trough at [facility]  Zareen Jamison, Drake Leach 09/06/2012,1:53 PM

## 2012-09-06 NOTE — Evaluation (Signed)
Physical Therapy Evaluation Patient Details Name: Ronnie Harvey MRN: [id number] DOB: 02/25/1946 Today's Date: 09/06/2012 Time: 4098-1191 PT Time Calculation (min): 32 min  PT Assessment / Plan / Recommendation Clinical Impression  pt admitted with afib and RVR, resp failure, SBO with microperforation.  Pt can benefit from PT to maximize IIndependence.    PT Assessment  Patient needs continued PT services    Follow Up Recommendations  Other (comment);No PT follow up (vs HHPT if pt does not quickly return to baseline)    Does the patient have the potential to tolerate intense rehabilitation      Barriers to Discharge        Equipment Recommendations  None recommended by PT    Recommendations for Other Services     Frequency Min 3X/week    Precautions / Restrictions Precautions Precautions: Fall (minor risk) Restrictions Weight Bearing Restrictions: No   Pertinent Vitals/Pain       Mobility  Bed Mobility Bed Mobility: Not assessed Transfers Transfers: Sit to Stand;Stand to Sit Sit to Stand: 4: Min assist;With upper extremity assist;From chair/3-in-1;From toilet Stand to Sit: 4: Min guard;With upper extremity assist;To chair/3-in-1;To toilet Details for Transfer Assistance: vc's for hand placement; minimal lifting assist Ambulation/Gait Ambulation/Gait Assistance: 4: Min guard Ambulation Distance (Feet): 150 Feet (then 60 after small rest) Assistive device: None Ambulation/Gait Assistance Details: initially guarded and unsteady, but significant improvement in steadiness with time up. Gait Pattern: Step-through pattern Stairs: No    Shoulder Instructions     Exercises     PT Diagnosis: Generalized weakness;Other (comment) (decr activity tolerance)  PT Problem List: Decreased strength;Decreased activity tolerance;Decreased balance;Cardiopulmonary status limiting activity PT Treatment Interventions: Gait training;Functional mobility training;Therapeutic  activities;Balance training;Patient/family education   PT Goals Acute Rehab PT Goals PT Goal Formulation: With patient Time For Goal Achievement: 09/06/12 Potential to Achieve Goals: Good Pt will go Supine/Side to Sit: Independently PT Goal: Supine/Side to Sit - Progress: Goal set today Pt will go Sit to Stand: Independently PT Goal: Sit to Stand - Progress: Goal set today Pt will Transfer Bed to Chair/Chair to Bed: Independently PT Transfer Goal: Bed to Chair/Chair to Bed - Progress: Goal set today Pt will Ambulate: >150 feet;Independently PT Goal: Ambulate - Progress: Goal set today  Visit Information  Last PT Received On: 09/06/12 Assistance Needed: +1    Subjective Data  Subjective: I'm hoping that I can get out of here sooner than that Patient Stated Goal: Home I   Prior Functioning  Home Living Lives With: Spouse Available Help at Discharge: Family Type of Home: House Home Access: Stairs to enter [employer] of Steps: 5 Entrance Stairs-Rails: Right;Left Home Layout: Two level;Able to live on main level with bedroom/bathroom;Bed/bath upstairs Alternate Level Stairs-Number of Steps: flight Alternate Level Stairs-Rails: Right;Left Bathroom Shower/Tub: Walk-in shower;Door Dentist: None Prior Function Level of Independence: Independent Able to Take Stairs?: Yes Vocation: Retired Musician: No difficulties    Cognition  Overall Cognitive Status: Appears within functional limits for tasks assessed/performed Arousal/Alertness: Awake/alert Orientation Level: Appears intact for tasks assessed Behavior During Session: [facility] for tasks performed    Extremity/Trunk Assessment Right Upper Extremity Assessment RUE ROM/Strength/Tone: Within functional levels Left Upper Extremity Assessment LUE ROM/Strength/Tone: Within functional levels Right Lower Extremity Assessment RLE ROM/Strength/Tone: Within  functional levels Left Lower Extremity Assessment LLE ROM/Strength/Tone: Within functional levels (bil general weakness from a week of inactivity) Trunk Assessment Trunk Assessment: Normal   Balance Balance Balance Assessed: Yes Dynamic Standing  Balance Dynamic Standing - Balance Support: During functional activity;No upper extremity supported Dynamic Standing - Level of Assistance: 5: Stand by assistance  End of Session PT - End of Session Activity Tolerance: Patient tolerated treatment well Patient left: in chair;with call bell/phone within reach Nurse Communication: Mobility status  GP     Beatris Belen, Eliseo Gum 09/06/2012, 12:03 PM  09/06/2012  Maury Bing, PT [phone] (pager)

## 2012-09-06 NOTE — Progress Notes (Signed)
Patient: Ronnie Harvey / Admit Date: 09/03/2012 / Date of Encounter: 09/06/2012, 6:55 AM   Subjective  66 y/o M with no prior medical history presented to [facility] 08/31/12 with abdominal pain and diagnosed with diverticulitis and potentially perforated diverticulum. He was seen by cardiology and found to have SVT felt to be atrial flutter as well as atrial fib. CT did demonstrate atherosclerotic calcification within the aorta and coronary arteries. Troponin up to 0.9 at [facility] (ref range 0.3-0.6). 2D echo showed EF of 20%, LV chamber mod dilated, global severe HK per notes, RVSP & mod MR. He was sent down for CT at [facility] and was noted to be diaphoretic, tachycardic and in congestive heart failure. He was intubated for acute respiratory failure and given IV Lasix. He was transferred to [facility] for further critical care management including antibiotics. Low BP prohibiting med titration.  Blood cx with GPC in clusters resulting yesterday.  Denies CP, SOB, or abd pain.   Objective   Telemetry: NSR currently but paroxysms of rapid afib/flutter overnight Physical Exam Filed Vitals:   09/06/12 0500  BP: 127/70  Pulse: 85  Temp: 97.5  Resp: 24  94 [place] General: Well developed thin WM in no acute distress. Head: Normocephalic, atraumatic, sclera non-icteric, no xanthomas, nares are without discharge. Neck: JVD not elevated. Lungs: Decreased BS throughout. No wheezes or rales. Breathing is unlabored. Heart: RRR S1 S2 without murmurs, rubs, or gallops.  Abdomen: Soft, non-tender, rounded with hypoactive bowel sounds. No rebound/guarding.  Msk:  Strength and tone appear normal for age. Extremities: No clubbing or cyanosis. No edema.  Distal pedal pulses intact. SCDs placed. Neuro: Alert and oriented X 3. Moves all extremities spontaneously. Psych:  Responds to questions appropriately with a normal affect.    Intake/Output Summary (Last 24 hours) at  09/06/12 0655 Last data filed at 09/06/12 0500  Gross per 24 hour  Intake 1465.6 ml  Output   2050 ml  Net -584.4 ml    Inpatient Medications:    . antiseptic oral rinse  15 mL Mouth Rinse q12n4p  . chlorhexidine  15 mL Mouth Rinse BID  . furosemide  40 mg Intravenous Q8H  . heparin subcutaneous  5,000 Units Subcutaneous Q8H  . insulin aspart  0-9 Units Subcutaneous Q4H  . iohexol  20 mL Oral Q1 Hr x 2  . pantoprazole (PROTONIX) IV  40 mg Intravenous QHS  . piperacillin-tazobactam (ZOSYN)  IV  3.375 g Intravenous Q8H    Labs:  [facility] 09/06/12 0420 09/05/12 1735 09/03/12 2042  NA 151* 151* --  K 3.6 3.2* --  CL 108 108 --  CO2 34* 31 --  GLUCOSE 108* 120* --  BUN 23 27* --  CREATININE 0.94 0.91 --  CALCIUM 8.4 8.6 --  MG 1.9 -- 2.1  PHOS 2.5 -- --    Basename 09/06/12 0420 09/05/12 0400  AST 38* 29  ALT 18 13  ALKPHOS 55 50  BILITOT 0.4 0.3  PROT 6.0 5.7*  ALBUMIN 2.2* 2.0*    Basename 09/06/12 0420 09/05/12 0400  WBC 10.1 8.8  NEUTROABS 8.0* 7.5  HGB 14.5 14.2  HCT 43.8 42.9  MCV 97.6 97.7  PLT 338 322    Basename 09/04/12 0400 09/03/12 2118 09/03/12 1558  CKTOTAL -- -- --  CKMB -- -- --  TROPONINI <0.30 <0.30 <0.30   Radiology/Studies:  Dg Chest Port 1 View  09/05/2012  *RADIOLOGY REPORT*  Clinical Data: Shortness of breath.  PORTABLE CHEST - 1 VIEW  Comparison: 09/04/2012.  Findings: The endotracheal tube is 2.3 cm above the carina.  The NG tube and right subclavian catheters are stable.  The heart is enlarged but unchanged.  Persistent interstitial and airspace process in the lungs along with bilateral pleural effusions.  IMPRESSION:  1.  Stable support apparatus. 2.  Persistent interstitial and airspace process in the lungs and the small bilateral effusions.   Original Report Authenticated By: Rudie Meyer, M.D.    Portable Chest Xray In Am  09/04/2012  *RADIOLOGY REPORT*  Clinical Data: Evaluate pulmonary edema, endotracheal tube  PORTABLE CHEST  - 1 VIEW  Comparison: 09/03/2012 (multiple examinations); 09/02/2012; 08/30/2012  Findings: Grossly unchanged enlarged cardiac silhouette and mediastinal contours with tortuosity within the thoracic aorta. Atherosclerotic calcifications in the aortic arch.  The pulmonary vasculature remains indistinct.  Grossly unchanged perihilar heterogeneous air space opacities, right greater than left.  No definite pleural effusion or pneumothorax.  Unchanged bones.  IMPRESSION: 1.  Stable positioning of support apparatus.  No pneumothorax. 2.  Grossly unchanged perihilar air space opacities which given rapidity of onset is favored to represent pulmonary edema, though note, underlying infection and/or aspiration is not excluded.   Original Report Authenticated By: Tacey Ruiz, MD    Portable Chest Xray To Verify Ett/ Central Line Placement  09/03/2012  *RADIOLOGY REPORT*  Clinical Data: ET tube placement.  PORTABLE CHEST - 1 VIEW  Comparison: Chest 09/03/2012 at.  Findings: Endotracheal tube is in place with tip just below the clavicular heads in good position.  NG tube courses into the stomach.  Bilateral airspace disease has improved.  No pneumothorax identified.  There appear be small bilateral pleural effusions.  IMPRESSION:  1.  ET tube in good position. 2.  Improved bilateral airspace disease.   Original Report Authenticated By: Holley Dexter, M.D.      Assessment and Plan  1. Diverticulitis with bowel microperforation, SBO - surgery following, on antibiotics.  2. Acute respiratory failure, requiring intubation in setting of abdominal process, afib RVR, cardiogenic pulmonary edema - s/p extubation.  3. Paroxysmal atrial fib/atrial flutter - likely exacerbated by acute illness. CHADS2 = 1 at present for CHF; will also check A1C Would not fully anticoagulate in setting of acute abdominal illness - f/u CT pending today. Continues on IV amiodarone as not yet taking POs.  4. Elevated troponins with possible  underlying CAD - trop 0.09 at [facility]. Troponins negative here. Atherosclerotic dz on CT at that time. Will likely need cath when clinical status improves given degree of LV dysfunction. Primary team - are we OK to start maintenance aspirin? Getting sporadic IV lopressor for rapid rates - consider scheduling q6hr. Statin when taking POs.  5. 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 No ACEI/ARB due to hypotension. See above re: BB.  6. Elevated PT/INR - improved.  7. Hypotension - ? Sepsis picture - note blood cx results with gram positive cocci. Abx mgmt per primary team. BP improved since yesterday.   8. Mod pHTN - possibly r/t longstanding tobacco abuse or LV dysfunction.   9. NSVT - follow labs. Cath when more stable. See above re: BB.  10. Hypernatremia - lytes, free water per PCCM.  Signed,  Ronie Spies PA-C  Patient seen and examined  Agree with findings of D Dunn above. On exam:  JVP not elevated.  Lungs are without wheezes or rales.  Cardiac exam RRR  No S3  Ext  No edemai  Tele:  SR with paroxysmal AFIb.  Patient continues on IV amiodarone.   CHADS2 score is 1 but CHADS2Vasc is 3.  Patient currently not a candidate for anticoag  At some point will need ischemic evaluation once recovers from this.  Watch I/Os and   Relatively stable.  Dietrich Pates

## 2012-09-07 DIAGNOSIS — I5021 Acute systolic (congestive) heart failure: Secondary | ICD-10-CM

## 2012-09-07 DIAGNOSIS — I509 Heart failure, unspecified: Secondary | ICD-10-CM

## 2012-09-07 LAB — BASIC METABOLIC PANEL
BUN: 17 mg/dL (ref 6–23)
CO2: 31 mEq/L (ref 19–32)
Calcium: 8.2 mg/dL — ABNORMAL LOW (ref 8.4–10.5)
Creatinine, Ser: 0.92 mg/dL (ref 0.50–1.35)
GFR calc non Af Amer: 85 mL/min — ABNORMAL LOW (ref >90)
Glucose, Bld: 108 mg/dL — ABNORMAL HIGH (ref 70–99)
Sodium: 145 mEq/L (ref 135–145)

## 2012-09-07 LAB — CBC WITH DIFFERENTIAL/PLATELET
Eosinophils Absolute: 0 10*3/uL (ref 0.0–0.7)
Eosinophils Relative: 0 % (ref 0–5)
Lymphs Abs: 1.3 10*3/uL (ref 0.7–4.0)
MCH: 32.1 pg (ref 26.0–34.0)
MCHC: 33.5 g/dL (ref 30.0–36.0)
MCV: 95.7 fL (ref 78.0–100.0)
Monocytes Absolute: 0.9 10*3/uL (ref 0.1–1.0)
Neutrophils Relative %: 83 % — ABNORMAL HIGH (ref 43–77)
Platelets: 337 10*3/uL (ref 150–400)
RBC: 4.68 MIL/uL (ref 4.22–5.81)

## 2012-09-07 LAB — GLUCOSE, CAPILLARY: Glucose-Capillary: 110 mg/dL — ABNORMAL HIGH (ref 70–99)

## 2012-09-07 LAB — CULTURE, BLOOD (ROUTINE X 2)

## 2012-09-07 MED ORDER — FUROSEMIDE 10 MG/ML IJ SOLN
40.0000 mg | Freq: Every day | INTRAMUSCULAR | Status: DC
  Administered 2012-09-07 – 2012-09-09 (×3): 40 mg via INTRAVENOUS
  Filled 2012-09-07 (×3): qty 4

## 2012-09-07 MED ORDER — POTASSIUM CHLORIDE CRYS ER 20 MEQ PO TBCR
40.0000 meq | EXTENDED_RELEASE_TABLET | ORAL | Status: AC
  Administered 2012-09-07 (×2): 40 meq via ORAL
  Filled 2012-09-07 (×2): qty 2

## 2012-09-07 MED ORDER — METOPROLOL TARTRATE 1 MG/ML IV SOLN
2.5000 mg | Freq: Four times a day (QID) | INTRAVENOUS | Status: DC
  Administered 2012-09-07 – 2012-09-10 (×12): 2.5 mg via INTRAVENOUS
  Filled 2012-09-07 (×16): qty 5

## 2012-09-07 NOTE — Progress Notes (Addendum)
SUBJECTIVE:  Doing better, less SOB.  still having episodes of elevated heart rate with PAF at rest in the 110-120bpm range and then converts back to NSR in the 80's  OBJECTIVE:   Vitals:   Filed Vitals:   09/06/12 1412 09/06/12 2100 09/07/12 0500 09/07/12 1035  BP: 122/73 121/55 136/74 111/67  Pulse: 100 65 78 85  Temp: 98.3 F (36.8 C) 97.5 F (36.4 C) 98 F (36.7 C)   TempSrc:      Resp: 18 19 18    Height:      Weight:      SpO2: 92% 93% 94%    I&O's:   Intake/Output Summary (Last 24 hours) at 09/07/12 1133 Last data filed at 09/07/12 0836  Gross per 24 hour  Intake 1632.1 ml  Output    200 ml  Net 1432.1 ml   TELEMETRY: Reviewed telemetry pt in paroxysmal atrial fibrillation with RVR and intermittent NSR     PHYSICAL EXAM General: Well developed, well nourished, in no acute distress Head: Eyes PERRLA, No xanthomas.   Normal cephalic and atramatic  Lungs:   Clear bilaterally to auscultation and percussion. Heart:   Irregularly irregular tachy S1 S2 Pulses are 2+ & equal. Abdomen: Bowel sounds are positive, abdomen soft and non-tender without masses  Extremities:   No clubbing, cyanosis or edema.  DP +1 Neuro: Alert and oriented X 3. Psych:  Good affect, responds appropriately   LABS: Basic Metabolic Panel:  Basename 09/07/12 0600 09/06/12 0420  NA 145 151*  K 2.8* 3.6  CL 103 108  CO2 31 34*  GLUCOSE 108* 108*  BUN 17 23  CREATININE 0.92 0.94  CALCIUM 8.2* 8.4  MG -- 1.9  PHOS -- 2.5   Liver Function Tests:  Basename 09/06/12 0420 09/05/12 0400  AST 38* 29  ALT 18 13  ALKPHOS 55 50  BILITOT 0.4 0.3  PROT 6.0 5.7*  ALBUMIN 2.2* 2.0*   No results found for this basename: LIPASE:2,AMYLASE:2 in the last 72 hours CBC:  Basename 09/07/12 0600 09/06/12 0420  WBC 12.9* 10.1  NEUTROABS 10.7* 8.0*  HGB 15.0 14.5  HCT 44.8 43.8  MCV 95.7 97.6  PLT 337 338    Basename 09/04/12 1300  TSH 1.998  T4TOTAL --  T3FREE --  THYROIDAB --   Anemia  Panel: No results found for this basename: VITAMINB12,FOLATE,FERRITIN,TIBC,IRON,RETICCTPCT in the last 72 hours Coag Panel:   Lab Results  Component Value Date   INR 1.42 09/05/2012   INR 1.50* 09/03/2012    RADIOLOGY: Ct Abdomen Pelvis W Contrast  09/06/2012  *RADIOLOGY REPORT*  Clinical Data: Abdominal distension with nausea.  Diverticulitis with micro perforation.  Fever respiratory distress.  Small bowel obstruction.  Alcohol and tobacco abuse.  CT ABDOMEN AND PELVIS WITH CONTRAST  Technique:  Multidetector CT imaging of the abdomen and pelvis was performed following the standard protocol during bolus administration of intravenous contrast.  Contrast: OMNIPAQUE IOHEXOL 300 MG/ML  SOLN  Comparison: 08/31/2012.  Findings: Small pleural effusions and basilar atelectasis.  Nasogastric tube in place tip in region of the gastric antrum.  Previously noted free intraperitoneal air has cleared.  Thickened abnormal appearing sigmoid colon may represent changes of diverticulosis/diverticulitis.  Underlying mass not excluded.  Increased amount of fluid throughout the abdomen pelvis.  Although some of this may be caused by ascites related to cirrhosis, result of peritonitis not excluded given the findings of prior bowel perforation.  Fluid collections within the lower abdomen and pelvis  demonstrate mild peripheral enhancement.  Although no well defined abscess currently, the patient may be at risk for developing such.  Small bowel loops with slightly thickened folds and with dilated segment superior to the transverse colon may reflect changes of inflammation/reactive changes  No bowel containing hernia identified.  Fat containing right inguinal hernia.  Slightly lobulated appearance of liver may indicate cirrhosis with surrounding varices.  Hypodensity of the liver adjacent to the falciform ligament spanning over 2.8 cm.  This is new or more conspicuous when compared to the prior examination.  Given the  surrounding findings, early abscess not excluded and close attention to this recommended.  Ascites surrounding the spleen with indentation and superior margin.  Right adrenal gland 1.4 cm nodule cannot be confirmed as an adenoma and is without change.  Hyperplasia remainder of the adrenal glands.  Left renal 6.2 cm cyst.  Other renal low density lesions more notable on the right may be cysts although some too small to characterize but without change.  Gallbladder is full without calcified gallstone.  No focal pancreatic lesion.  Duodenal diverticulum at pancreatic head level is noted.  Coronary artery calcifications.  Prominent irregular plaque throughout the abdominal aorta with ectasia measuring up to 2.8 cm. Plaque iliac arteries with narrowing.  Plaque superior mesenteric artery with narrowing.  Poor delineation inferior mesenteric artery.  Third spacing of fluid.  This is noted in the subcutaneous region as well as involving the fascial planes bilaterally.  Scoliosis and degenerative changes most notable degenerative changes lower lumbar region.  Bilateral hip joint degenerative changes.  IMPRESSION: Increase in amount of abdominal and pelvic fluid which demonstrates peripheral enhancement suggesting peritonitis although some of fluid may be related to patient's liver disease.  Presently no well- defined drainable abscess although the patient may be at risk for development of such.  Change in appearance of the anterior aspect of the medial segment left lobe liver where hypodensity spanning over 2.8 cm is noted or more conspicuous than on the prior exam.  Given the surrounding findings, small liver abscess not entirely excluded.  Close attention to this on follow-up.  Thickened appearance of the sigmoid colon may indicate changes of diverticulosis/diverticulitis.  Underlying mass not excluded.  Regions of small bowel have slightly thickened folds and other areas appears slightly dilated which may indicate  inflammatory/reactive changes.  Prominent atherosclerotic type changes as detailed above.  1.4 cm right adrenal lesion unchanged.  This cannot be confirmed as an adenoma on the present exam.  Renal cystic structures.  Some of the low density structures cannot be confirmed as simple cysts on the present examination but without change.  This has been made a PRA call report utilizing dashboard call feature.   Original Report Authenticated By: Lacy Duverney, M.D.    Dg Chest Port 1 View  09/06/2012  *RADIOLOGY REPORT*  Clinical Data: Edema  PORTABLE CHEST - 1 VIEW  Comparison: Yesterday  Findings: Endotracheal tube removed.  NG tube stable.  Right subclavian venous catheters stable.  Upper normal heart size. Bibasilar airspace disease improved.  Vascular congestion improved. No pneumothorax.  IMPRESSION: Extubated.  Improved bilateral airspace disease and vascular congestion.   Original Report Authenticated By: Jolaine Click, M.D.    Dg Chest Port 1 View  09/05/2012  *RADIOLOGY REPORT*  Clinical Data: Shortness of breath.  PORTABLE CHEST - 1 VIEW  Comparison: 09/04/2012.  Findings: The endotracheal tube is 2.3 cm above the carina.  The NG tube and right subclavian catheters are stable.  The heart is enlarged but unchanged.  Persistent interstitial and airspace process in the lungs along with bilateral pleural effusions.  IMPRESSION:  1.  Stable support apparatus. 2.  Persistent interstitial and airspace process in the lungs and the small bilateral effusions.   Original Report Authenticated By: Rudie Meyer, M.D.    Portable Chest Xray In Am  09/04/2012  *RADIOLOGY REPORT*  Clinical Data: Evaluate pulmonary edema, endotracheal tube  PORTABLE CHEST - 1 VIEW  Comparison: 09/03/2012 (multiple examinations); 09/02/2012; 08/30/2012  Findings: Grossly unchanged enlarged cardiac silhouette and mediastinal contours with tortuosity within the thoracic aorta. Atherosclerotic calcifications in the aortic arch.  The  pulmonary vasculature remains indistinct.  Grossly unchanged perihilar heterogeneous air space opacities, right greater than left.  No definite pleural effusion or pneumothorax.  Unchanged bones.  IMPRESSION: 1.  Stable positioning of support apparatus.  No pneumothorax. 2.  Grossly unchanged perihilar air space opacities which given rapidity of onset is favored to represent pulmonary edema, though note, underlying infection and/or aspiration is not excluded.   Original Report Authenticated By: Tacey Ruiz, MD    Portable Chest Xray To Verify Ett/ Central Line Placement  09/03/2012  *RADIOLOGY REPORT*  Clinical Data: ET tube placement.  PORTABLE CHEST - 1 VIEW  Comparison: Chest 09/03/2012 at.  Findings: Endotracheal tube is in place with tip just below the clavicular heads in good position.  NG tube courses into the stomach.  Bilateral airspace disease has improved.  No pneumothorax identified.  There appear be small bilateral pleural effusions.  IMPRESSION:  1.  ET tube in good position. 2.  Improved bilateral airspace disease.   Original Report Authenticated By: Holley Dexter, M.D.    Assessment and Plan   1. Diverticulitis with bowel microperforation, SBO - surgery following, on antibiotics.  2. Acute respiratory failure, requiring intubation in setting of abdominal process, afib RVR, cardiogenic pulmonary edema - s/p extubation.  3. Paroxysmal atrial fib/atrial flutter - likely exacerbated by acute illness. CHADS2 = 1 at present for CHF; Would not fully anticoagulate in setting of acute abdominal illness.  Continues on IV amiodarone as not yet taking POs but heart rate not adequately controlled when going in and out of PAF.  Will add Lopressor 2.5mg  IV q6 hours and may need to go to 5mg  q6 hours until taking PO for rate control and to try to further suppress PAF 4. Elevated troponins with possible underlying CAD - trop 0.09 at [facility]. Troponins negative here. Atherosclerotic dz on CT at that  time. Will likely need cath when clinical status improves given degree of LV dysfunction. Primary team - are we OK to start maintenance aspirin? 5. 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 No ACEI/ARB due to hypotension. See above re: BB.  6. Elevated PT/INR - improved.  7. Hypotension - ? Sepsis picture - note blood cx results with gram positive cocci. Abx mgmt per primary team. BP improved since yesterday.  8. Mod pHTN - possibly r/t longstanding tobacco abuse or LV dysfunction.  9. NSVT - follow labs. Cath when more stable. See above re: BB.  10. Hypernatremia - lytes, free water per PCCM.  11.  Hypokalemia - replete       Quintella Reichert, MD  09/07/2012  11:33 AM

## 2012-09-07 NOTE — Progress Notes (Signed)
Patient ID: Ronnie Harvey, male   DOB: 1945-09-23, 66 y.o.   MRN: [id number]    Subjective: Occ mild L side abd pain, none now.  Tol CL diet.  Loose BMs  Objective: Vital signs in last 24 hours: Temp:  [97.1 F (36.2 C)-98.3 F (36.8 C)] 98 F (36.7 C) (12/21 0500) Pulse Rate:  [65-100] 78  (12/21 0500) Resp:  [18-27] 18  (12/21 0500) BP: (121-142)/(55-74) 136/74 mmHg (12/21 0500) SpO2:  [90 %-94 %] 94 % (12/21 0500) Last BM Date: 09/06/12  Intake/Output from previous day: 12/20 0701 - 12/21 0700 In: 1178.9 [P.O.:480; I.V.:363.9; IV Piggyback:335] Out: 200 [Urine:200] Intake/Output this shift:    General appearance: alert and no distress GI: normal findings: Very mild tenderness LLQ, mild distention  Lab Results:   Basename 09/07/12 0600 09/06/12 0420  WBC 12.9* 10.1  HGB 15.0 14.5  HCT 44.8 43.8  PLT 337 338   BMET  Basename 09/07/12 0600 09/06/12 0420  NA 145 151*  K 2.8* 3.6  CL 103 108  CO2 31 34*  GLUCOSE 108* 108*  BUN 17 23  CREATININE 0.92 0.94  CALCIUM 8.2* 8.4     Studies/Results: Ct Abdomen Pelvis W Contrast  09/06/2012  *RADIOLOGY REPORT*  Clinical Data: Abdominal distension with nausea.  Diverticulitis with micro perforation.  Fever respiratory distress.  Small bowel obstruction.  Alcohol and tobacco abuse.  CT ABDOMEN AND PELVIS WITH CONTRAST  Technique:  Multidetector CT imaging of the abdomen and pelvis was performed following the standard protocol during bolus administration of intravenous contrast.  Contrast: OMNIPAQUE IOHEXOL 300 MG/ML  SOLN  Comparison: 08/31/2012.  Findings: Small pleural effusions and basilar atelectasis.  Nasogastric tube in place tip in region of the gastric antrum.  Previously noted free intraperitoneal air has cleared.  Thickened abnormal appearing sigmoid colon may represent changes of diverticulosis/diverticulitis.  Underlying mass not excluded.  Increased amount of fluid throughout the abdomen pelvis.  Although  some of this may be caused by ascites related to cirrhosis, result of peritonitis not excluded given the findings of prior bowel perforation.  Fluid collections within the lower abdomen and pelvis demonstrate mild peripheral enhancement.  Although no well defined abscess currently, the patient may be at risk for developing such.  Small bowel loops with slightly thickened folds and with dilated segment superior to the transverse colon may reflect changes of inflammation/reactive changes  No bowel containing hernia identified.  Fat containing right inguinal hernia.  Slightly lobulated appearance of liver may indicate cirrhosis with surrounding varices.  Hypodensity of the liver adjacent to the falciform ligament spanning over 2.8 cm.  This is new or more conspicuous when compared to the prior examination.  Given the surrounding findings, early abscess not excluded and close attention to this recommended.  Ascites surrounding the spleen with indentation and superior margin.  Right adrenal gland 1.4 cm nodule cannot be confirmed as an adenoma and is without change.  Hyperplasia remainder of the adrenal glands.  Left renal 6.2 cm cyst.  Other renal low density lesions more notable on the right may be cysts although some too small to characterize but without change.  Gallbladder is full without calcified gallstone.  No focal pancreatic lesion.  Duodenal diverticulum at pancreatic head level is noted.  Coronary artery calcifications.  Prominent irregular plaque throughout the abdominal aorta with ectasia measuring up to 2.8 cm. Plaque iliac arteries with narrowing.  Plaque superior mesenteric artery with narrowing.  Poor delineation inferior mesenteric artery.  Third spacing of fluid.  This is noted in the subcutaneous region as well as involving the fascial planes bilaterally.  Scoliosis and degenerative changes most notable degenerative changes lower lumbar region.  Bilateral hip joint degenerative changes.  IMPRESSION:  Increase in amount of abdominal and pelvic fluid which demonstrates peripheral enhancement suggesting peritonitis although some of fluid may be related to patient's liver disease.  Presently no well- defined drainable abscess although the patient may be at risk for development of such.  Change in appearance of the anterior aspect of the medial segment left lobe liver where hypodensity spanning over 2.8 cm is noted or more conspicuous than on the prior exam.  Given the surrounding findings, small liver abscess not entirely excluded.  Close attention to this on follow-up.  Thickened appearance of the sigmoid colon may indicate changes of diverticulosis/diverticulitis.  Underlying mass not excluded.  Regions of small bowel have slightly thickened folds and other areas appears slightly dilated which may indicate inflammatory/reactive changes.  Prominent atherosclerotic type changes as detailed above.  1.4 cm right adrenal lesion unchanged.  This cannot be confirmed as an adenoma on the present exam.  Renal cystic structures.  Some of the low density structures cannot be confirmed as simple cysts on the present examination but without change.  This has been made a PRA call report utilizing dashboard call feature.   Original Report Authenticated By: Lacy Duverney, M.D.    Dg Chest Port 1 View  09/06/2012  *RADIOLOGY REPORT*  Clinical Data: Edema  PORTABLE CHEST - 1 VIEW  Comparison: Yesterday  Findings: Endotracheal tube removed.  NG tube stable.  Right subclavian venous catheters stable.  Upper normal heart size. Bibasilar airspace disease improved.  Vascular congestion improved. No pneumothorax.  IMPRESSION: Extubated.  Improved bilateral airspace disease and vascular congestion.   Original Report Authenticated By: Jolaine Click, M.D.     Anti-infectives: Anti-infectives     Start     Dose/Rate Route Frequency Ordered Stop   09/06/12 1500   vancomycin (VANCOCIN) 750 mg in sodium chloride 0.9 % 150 mL IVPB         750 mg 150 mL/hr over 60 Minutes Intravenous Every 12 hours 09/06/12 1352     09/05/12 1100  piperacillin-tazobactam (ZOSYN) IVPB 3.375 g       3.375 g 12.5 mL/hr over 240 Minutes Intravenous Every 8 hours 09/05/12 0907     09/03/12 1615   ertapenem (INVANZ) 1 g in sodium chloride 0.9 % 50 mL IVPB  Status:  Discontinued        1 g 100 mL/hr over 30 Minutes Intravenous Every 24 hours 09/03/12 1603 09/05/12 7829          Assessment/Plan: Diverticulitis sigmoid colon with microperforation.  Improving, repeat CT showed only thickening of sigmoid colon Advance to [facility] diet.  Cont IV abx     LOS: 4 days    Britten Parady T 09/07/2012

## 2012-09-07 NOTE — Progress Notes (Signed)
[facility]             Progress Note   Subjective: Feels much better. No abdominal pain. Has been having liquid BMs.  Objective: Vital signs in last 24 hours: Temp:  [97.1 F (36.2 C)-98.3 F (36.8 C)] 98 F (36.7 C) (12/21 0500) Pulse Rate:  [65-100] 78  (12/21 0500) Resp:  [18-27] 18  (12/21 0500) BP: (121-142)/(55-74) 136/74 mmHg (12/21 0500) SpO2:  [91 %-94 %] 94 % (12/21 0500) Weight change:  Last BM Date: 09/07/12  Intake/Output from previous day: 12/20 0701 - 12/21 0700 In: 1178.9 [P.O.:480; I.V.:363.9; IV Piggyback:335] Out: 200 [Urine:200] Total I/O In: 600 [P.O.:600] Out: -    Physical Exam: General: Alert, awake, oriented x3, in no acute distress. HEENT: No bruits, no goiter. Heart: Regular rate and rhythm, without murmurs, rubs, gallops. Lungs: Clear to auscultation bilaterally. Abdomen: Soft, nontender, nondistended, positive bowel sounds. Extremities: No clubbing cyanosis or edema with positive pedal pulses. Neuro: Grossly intact, nonfocal.    Lab Results: Basic Metabolic Panel:  Basename 09/07/12 0600 09/06/12 0420  NA 145 151*  K 2.8* 3.6  CL 103 108  CO2 31 34*  GLUCOSE 108* 108*  BUN 17 23  CREATININE 0.92 0.94  CALCIUM 8.2* 8.4  MG -- 1.9  PHOS -- 2.5   Liver Function Tests:  Basename 09/06/12 0420 09/05/12 0400  AST 38* 29  ALT 18 13  ALKPHOS 55 50  BILITOT 0.4 0.3  PROT 6.0 5.7*  ALBUMIN 2.2* 2.0*   CBC:  Basename 09/07/12 0600 09/06/12 0420  WBC 12.9* 10.1  NEUTROABS 10.7* 8.0*  HGB 15.0 14.5  HCT 44.8 43.8  MCV 95.7 97.6  PLT 337 338   CBG:  Basename 09/07/12 0727 09/06/12 2351 09/06/12 1952 09/06/12 1641 09/06/12 1223 09/06/12 0944  GLUCAP 110* 125* 132* 150* 128* 125*   Thyroid Function Tests:  Basename 09/04/12 1300  TSH 1.998  T4TOTAL --  FREET4 --  T3FREE --  THYROIDAB --   Coagulation:  Basename 09/05/12 0400  LABPROT 17.0*  INR 1.42    Recent Results (from the past 240  hour(s))  MRSA PCR SCREENING     Status: Normal   Collection Time   09/03/12  3:24 PM      Component Value Range Status Comment   MRSA by PCR NEGATIVE  NEGATIVE Final   URINE CULTURE     Status: Normal   Collection Time   09/03/12  9:15 PM      Component Value Range Status Comment   Specimen Description URINE, CATHETERIZED   Final    Special Requests NONE   Final    Culture  Setup Time 09/03/2012 22:03   Final    Colony Count NO GROWTH   Final    Culture NO GROWTH   Final    Report Status 09/05/2012 FINAL   Final   CULTURE, RESPIRATORY     Status: Normal   Collection Time   09/04/12 12:51 PM      Component Value Range Status Comment   Specimen Description ENDOTRACHEAL   Final    Special Requests Normal   Final    Gram Stain     Final    Value: FEW WBC PRESENT, PREDOMINANTLY MONONUCLEAR     NO SQUAMOUS EPITHELIAL CELLS SEEN     NO ORGANISMS SEEN   Culture FEW CANDIDA ALBICANS   Final    Report Status 09/06/2012 FINAL   Final   CULTURE, BLOOD (ROUTINE X 2)  Status: Normal (Preliminary result)   Collection Time   09/04/12  3:50 PM      Component Value Range Status Comment   Specimen Description BLOOD LEFT ARM   Final    Special Requests BOTTLES DRAWN AEROBIC AND ANAEROBIC 10CC   Final    Culture  Setup Time 09/05/2012 00:12   Final    Culture     Final    Value:        BLOOD CULTURE RECEIVED NO GROWTH TO DATE CULTURE WILL BE HELD FOR 5 DAYS BEFORE ISSUING A FINAL NEGATIVE REPORT   Report Status PENDING   Incomplete   CULTURE, BLOOD (ROUTINE X 2)     Status: Normal (Preliminary result)   Collection Time   09/04/12  4:00 PM      Component Value Range Status Comment   Specimen Description BLOOD LEFT ARM   Final    Special Requests BOTTLES DRAWN AEROBIC AND ANAEROBIC 10CC   Final    Culture  Setup Time 09/05/2012 00:10   Final    Culture     Final    Value: GRAM POSITIVE COCCI IN CLUSTERS     Note: Gram Stain Report Called to,Read Back By and Verified With: ALEX Lona Kettle ON  09/05/12 AT 11:06P BY WILEJ   Report Status PENDING   Incomplete     Studies/Results: Ct Abdomen Pelvis W Contrast  09/06/2012  *RADIOLOGY REPORT*  Clinical Data: Abdominal distension with nausea.  Diverticulitis with micro perforation.  Fever respiratory distress.  Small bowel obstruction.  Alcohol and tobacco abuse.  CT ABDOMEN AND PELVIS WITH CONTRAST  Technique:  Multidetector CT imaging of the abdomen and pelvis was performed following the standard protocol during bolus administration of intravenous contrast.  Contrast: OMNIPAQUE IOHEXOL 300 MG/ML  SOLN  Comparison: 08/31/2012.  Findings: Small pleural effusions and basilar atelectasis.  Nasogastric tube in place tip in region of the gastric antrum.  Previously noted free intraperitoneal air has cleared.  Thickened abnormal appearing sigmoid colon may represent changes of diverticulosis/diverticulitis.  Underlying mass not excluded.  Increased amount of fluid throughout the abdomen pelvis.  Although some of this may be caused by ascites related to cirrhosis, result of peritonitis not excluded given the findings of prior bowel perforation.  Fluid collections within the lower abdomen and pelvis demonstrate mild peripheral enhancement.  Although no well defined abscess currently, the patient may be at risk for developing such.  Small bowel loops with slightly thickened folds and with dilated segment superior to the transverse colon may reflect changes of inflammation/reactive changes  No bowel containing hernia identified.  Fat containing right inguinal hernia.  Slightly lobulated appearance of liver may indicate cirrhosis with surrounding varices.  Hypodensity of the liver adjacent to the falciform ligament spanning over 2.8 cm.  This is new or more conspicuous when compared to the prior examination.  Given the surrounding findings, early abscess not excluded and close attention to this recommended.  Ascites surrounding the spleen with indentation  and superior margin.  Right adrenal gland 1.4 cm nodule cannot be confirmed as an adenoma and is without change.  Hyperplasia remainder of the adrenal glands.  Left renal 6.2 cm cyst.  Other renal low density lesions more notable on the right may be cysts although some too small to characterize but without change.  Gallbladder is full without calcified gallstone.  No focal pancreatic lesion.  Duodenal diverticulum at pancreatic head level is noted.  Coronary artery calcifications.  Prominent irregular plaque  throughout the abdominal aorta with ectasia measuring up to 2.8 cm. Plaque iliac arteries with narrowing.  Plaque superior mesenteric artery with narrowing.  Poor delineation inferior mesenteric artery.  Third spacing of fluid.  This is noted in the subcutaneous region as well as involving the fascial planes bilaterally.  Scoliosis and degenerative changes most notable degenerative changes lower lumbar region.  Bilateral hip joint degenerative changes.  IMPRESSION: Increase in amount of abdominal and pelvic fluid which demonstrates peripheral enhancement suggesting peritonitis although some of fluid may be related to patient's liver disease.  Presently no well- defined drainable abscess although the patient may be at risk for development of such.  Change in appearance of the anterior aspect of the medial segment left lobe liver where hypodensity spanning over 2.8 cm is noted or more conspicuous than on the prior exam.  Given the surrounding findings, small liver abscess not entirely excluded.  Close attention to this on follow-up.  Thickened appearance of the sigmoid colon may indicate changes of diverticulosis/diverticulitis.  Underlying mass not excluded.  Regions of small bowel have slightly thickened folds and other areas appears slightly dilated which may indicate inflammatory/reactive changes.  Prominent atherosclerotic type changes as detailed above.  1.4 cm right adrenal lesion unchanged.  This cannot be  confirmed as an adenoma on the present exam.  Renal cystic structures.  Some of the low density structures cannot be confirmed as simple cysts on the present examination but without change.  This has been made a PRA call report utilizing dashboard call feature.   Original Report Authenticated By: Lacy Duverney, M.D.    Dg Chest Port 1 View  09/06/2012  *RADIOLOGY REPORT*  Clinical Data: Edema  PORTABLE CHEST - 1 VIEW  Comparison: Yesterday  Findings: Endotracheal tube removed.  NG tube stable.  Right subclavian venous catheters stable.  Upper normal heart size. Bibasilar airspace disease improved.  Vascular congestion improved. No pneumothorax.  IMPRESSION: Extubated.  Improved bilateral airspace disease and vascular congestion.   Original Report Authenticated By: Jolaine Click, M.D.     Medications: Scheduled Meds:   . antiseptic oral rinse  15 mL Mouth Rinse q12n4p  . chlorhexidine  15 mL Mouth Rinse BID  . heparin subcutaneous  5,000 Units Subcutaneous Q8H  . pantoprazole (PROTONIX) IV  40 mg Intravenous QHS  . piperacillin-tazobactam (ZOSYN)  IV  3.375 g Intravenous Q8H  . pneumococcal 23 valent vaccine  0.5 mL Intramuscular Tomorrow-1000  . vancomycin  750 mg Intravenous Q12H   Continuous Infusions:   . amiodarone (NEXTERONE PREMIX) 360 mg/200 mL dextrose 0.5 mg/min (09/06/12 2242)  . dextrose 40 mL/hr at 09/05/12 2300   PRN Meds:.sodium chloride, influenza  inactive virus vaccine, metoprolol  Assessment/Plan:  Principal Problem:  *Diverticulitis Active Problems:  Acute respiratory failure due to pulmonary edema  Paroxysmal AF  Cardiomyopathy  Smoker  Bowel microperforation  Positive blood culture  Hypernatremia  Acute systolic CHF (congestive heart failure)   Sigmoid Colon Diverticulitis with Microperforation -Abdominal pain improving. -Tolerating clears, will be advanced to full liquid diet today. -Continue vanc and zosyn for now.  Gram Positive  Bacteremia -??contaminant as only 1/2 BC. -Continue Vanc for now. -Afebrile.  Acute Respiratory Failure -Presumed 2/2 pulmonary edema in the setting of reduced EF (?new). -Is now extubated. -Will likely need ischemic evaluation after abdominal process improved (?timing). -Add lasix 40 q day. -Recheck CXR in am.  Hypokalemia -Replete PO. -Check Mg level.  A Fib -Likely exacerbated by acute illness. -On IV amiodarone. -Now  that he is tolerating POs, can likely convert to oral meds...will defer to cardiology as they are also on board.  Hypernatremia -Resolved. -Na is 145 today.   Time spent coordinating care: 35 minutes.   LOS: 4 days   Ronnie Harvey,Leibish Mcgregor [facility] Pager: [phone] 09/07/2012, 9:21 AM

## 2012-09-07 NOTE — Progress Notes (Signed)
QTc values calculated overnight are inconsistent with previously charted values for patient on amiodarone IV infusion. Calculated QTc values ranging from 0.53 to 0.56 overnight; previously charted values ranging from 0.40 to 0.49. Patient has been asymptomatic; sinus rhythm with rates 70s-80s, no frequent PVCs or VT overnight.  Notified Dr. Terressa Koyanagi of findings by telephone; received order to do EKG and recalculate QTc. Calculated QTc from lead II on EKG this AM is 0.56, consistent my previously calculated overnight values. Measurements, calculated QTc values on all of last night's strips and AM EKG confirmed by another RN using calipers.  Posted to shadow chart for MD review and communicated to oncoming RN, Lethea Killings.

## 2012-09-08 ENCOUNTER — Inpatient Hospital Stay (HOSPITAL_COMMUNITY): Payer: MEDICARE

## 2012-09-08 DIAGNOSIS — E876 Hypokalemia: Secondary | ICD-10-CM

## 2012-09-08 LAB — CBC WITH DIFFERENTIAL/PLATELET
Basophils Absolute: 0 10*3/uL (ref 0.0–0.1)
Eosinophils Absolute: 0 10*3/uL (ref 0.0–0.7)
Lymphocytes Relative: 9 % — ABNORMAL LOW (ref 12–46)
MCHC: 34.1 g/dL (ref 30.0–36.0)
Monocytes Relative: 8 % (ref 3–12)
Neutrophils Relative %: 83 % — ABNORMAL HIGH (ref 43–77)
Platelets: 325 10*3/uL (ref 150–400)
RDW: 13.3 % (ref 11.5–15.5)
WBC Morphology: INCREASED
WBC: 12 10*3/uL — ABNORMAL HIGH (ref 4.0–10.5)

## 2012-09-08 LAB — BASIC METABOLIC PANEL
BUN: 12 mg/dL (ref 6–23)
Calcium: 8.3 mg/dL — ABNORMAL LOW (ref 8.4–10.5)
Creatinine, Ser: 1.04 mg/dL (ref 0.50–1.35)
GFR calc non Af Amer: 72 mL/min — ABNORMAL LOW (ref >90)
Glucose, Bld: 150 mg/dL — ABNORMAL HIGH (ref 70–99)
Sodium: 141 mEq/L (ref 135–145)

## 2012-09-08 LAB — MAGNESIUM: Magnesium: 1.7 mg/dL (ref 1.5–2.5)

## 2012-09-08 LAB — GLUCOSE, CAPILLARY
Glucose-Capillary: 107 mg/dL — ABNORMAL HIGH (ref 70–99)
Glucose-Capillary: 115 mg/dL — ABNORMAL HIGH (ref 70–99)
Glucose-Capillary: 120 mg/dL — ABNORMAL HIGH (ref 70–99)
Glucose-Capillary: 124 mg/dL — ABNORMAL HIGH (ref 70–99)

## 2012-09-08 MED ORDER — POTASSIUM CHLORIDE CRYS ER 20 MEQ PO TBCR
40.0000 meq | EXTENDED_RELEASE_TABLET | ORAL | Status: AC
  Administered 2012-09-08 (×3): 40 meq via ORAL
  Filled 2012-09-08 (×3): qty 2

## 2012-09-08 NOTE — Progress Notes (Signed)
Patient ID: Ronnie Harvey, male   DOB: 12-Feb-1946, 66 y.o.   MRN: [id number] SUBJECTIVE:  Doing better, less SOB.  still having episodes of elevated heart rate with PAF at rest in the 110-120bpm range and then converts back to NSR in the 80's  OBJECTIVE:   Vitals:   Filed Vitals:   09/07/12 1430 09/07/12 2100 09/08/12 0110 09/08/12 0600  BP: 104/67 121/73 124/77 114/68  Pulse: 84 78 78 82  Temp:  97.7 F (36.5 C)  98.4 F (36.9 C)  TempSrc:      Resp:  16  14  Height:      Weight:      SpO2:  92% 93% 93%   I&O's:    Intake/Output Summary (Last 24 hours) at 09/08/12 0951 Last data filed at 09/08/12 0830  Gross per 24 hour  Intake 4131.51 ml  Output    800 ml  Net 3331.51 ml   TELEMETRY: Reviewed telemetry pt in paroxysmal atrial fibrillation with RVR and intermittent NSR     PHYSICAL EXAM General: Well developed, well nourished, in no acute distress Head: Eyes PERRLA, No xanthomas.   Normal cephalic and atramatic  Lungs:   Clear bilaterally to auscultation and percussion. Heart:   Irregularly irregular tachy S1 S2 Pulses are 2+ & equal. Abdomen: Bowel sounds are positive, abdomen soft and non-tender without masses  Extremities:   No clubbing, cyanosis or edema.  DP +1 Neuro: Alert and oriented X 3. Psych:  Good affect, responds appropriately   LABS: Basic Metabolic Panel:  Basename 09/07/12 1000 09/07/12 0600 09/06/12 0420  NA -- 145 151*  K -- 2.8* 3.6  CL -- 103 108  CO2 -- 31 34*  GLUCOSE -- 108* 108*  BUN -- 17 23  CREATININE -- 0.92 0.94  CALCIUM -- 8.2* 8.4  MG 1.8 -- 1.9  PHOS -- -- 2.5   Liver Function Tests:  [facility] 09/06/12 0420  AST 38*  ALT 18  ALKPHOS 55  BILITOT 0.4  PROT 6.0  ALBUMIN 2.2*   No results found for this basename: LIPASE:2,AMYLASE:2 in the last 72 hours CBC:  Basename 09/08/12 0911 09/07/12 0600  WBC 12.0* 12.9*  NEUTROABS PENDING 10.7*  HGB 14.8 15.0  HCT 43.4 44.8  MCV 94.8 95.7  PLT 325 337   No results  found for this basename: TSH,T4TOTAL,FREET3,T3FREE,THYROIDAB in the last 72 hours Anemia Panel: No results found for this basename: VITAMINB12,FOLATE,FERRITIN,TIBC,IRON,RETICCTPCT in the last 72 hours Coag Panel:   Lab Results  Component Value Date   INR 1.42 09/05/2012   INR 1.50* 09/03/2012    RADIOLOGY: Ct Abdomen Pelvis W Contrast  09/06/2012  *RADIOLOGY REPORT*  Clinical Data: Abdominal distension with nausea.  Diverticulitis with micro perforation.  Fever respiratory distress.  Small bowel obstruction.  Alcohol and tobacco abuse.  CT ABDOMEN AND PELVIS WITH CONTRAST  Technique:  Multidetector CT imaging of the abdomen and pelvis was performed following the standard protocol during bolus administration of intravenous contrast.  Contrast: OMNIPAQUE IOHEXOL 300 MG/ML  SOLN  Comparison: 08/31/2012.  Findings: Small pleural effusions and basilar atelectasis.  Nasogastric tube in place tip in region of the gastric antrum.  Previously noted free intraperitoneal air has cleared.  Thickened abnormal appearing sigmoid colon may represent changes of diverticulosis/diverticulitis.  Underlying mass not excluded.  Increased amount of fluid throughout the abdomen pelvis.  Although some of this may be caused by ascites related to cirrhosis, result of peritonitis not excluded given the findings of  prior bowel perforation.  Fluid collections within the lower abdomen and pelvis demonstrate mild peripheral enhancement.  Although no well defined abscess currently, the patient may be at risk for developing such.  Small bowel loops with slightly thickened folds and with dilated segment superior to the transverse colon may reflect changes of inflammation/reactive changes  No bowel containing hernia identified.  Fat containing right inguinal hernia.  Slightly lobulated appearance of liver may indicate cirrhosis with surrounding varices.  Hypodensity of the liver adjacent to the falciform ligament spanning over 2.8  cm.  This is new or more conspicuous when compared to the prior examination.  Given the surrounding findings, early abscess not excluded and close attention to this recommended.  Ascites surrounding the spleen with indentation and superior margin.  Right adrenal gland 1.4 cm nodule cannot be confirmed as an adenoma and is without change.  Hyperplasia remainder of the adrenal glands.  Left renal 6.2 cm cyst.  Other renal low density lesions more notable on the right may be cysts although some too small to characterize but without change.  Gallbladder is full without calcified gallstone.  No focal pancreatic lesion.  Duodenal diverticulum at pancreatic head level is noted.  Coronary artery calcifications.  Prominent irregular plaque throughout the abdominal aorta with ectasia measuring up to 2.8 cm. Plaque iliac arteries with narrowing.  Plaque superior mesenteric artery with narrowing.  Poor delineation inferior mesenteric artery.  Third spacing of fluid.  This is noted in the subcutaneous region as well as involving the fascial planes bilaterally.  Scoliosis and degenerative changes most notable degenerative changes lower lumbar region.  Bilateral hip joint degenerative changes.  IMPRESSION: Increase in amount of abdominal and pelvic fluid which demonstrates peripheral enhancement suggesting peritonitis although some of fluid may be related to patient's liver disease.  Presently no well- defined drainable abscess although the patient may be at risk for development of such.  Change in appearance of the anterior aspect of the medial segment left lobe liver where hypodensity spanning over 2.8 cm is noted or more conspicuous than on the prior exam.  Given the surrounding findings, small liver abscess not entirely excluded.  Close attention to this on follow-up.  Thickened appearance of the sigmoid colon may indicate changes of diverticulosis/diverticulitis.  Underlying mass not excluded.  Regions of small bowel have  slightly thickened folds and other areas appears slightly dilated which may indicate inflammatory/reactive changes.  Prominent atherosclerotic type changes as detailed above.  1.4 cm right adrenal lesion unchanged.  This cannot be confirmed as an adenoma on the present exam.  Renal cystic structures.  Some of the low density structures cannot be confirmed as simple cysts on the present examination but without change.  This has been made a PRA call report utilizing dashboard call feature.   Original Report Authenticated By: Lacy Duverney, M.D.    Dg Chest Port 1 View  09/06/2012  *RADIOLOGY REPORT*  Clinical Data: Edema  PORTABLE CHEST - 1 VIEW  Comparison: Yesterday  Findings: Endotracheal tube removed.  NG tube stable.  Right subclavian venous catheters stable.  Upper normal heart size. Bibasilar airspace disease improved.  Vascular congestion improved. No pneumothorax.  IMPRESSION: Extubated.  Improved bilateral airspace disease and vascular congestion.   Original Report Authenticated By: Jolaine Click, M.D.    Dg Chest Port 1 View  09/05/2012  *RADIOLOGY REPORT*  Clinical Data: Shortness of breath.  PORTABLE CHEST - 1 VIEW  Comparison: 09/04/2012.  Findings: The endotracheal tube is 2.3 cm above  the carina.  The NG tube and right subclavian catheters are stable.  The heart is enlarged but unchanged.  Persistent interstitial and airspace process in the lungs along with bilateral pleural effusions.  IMPRESSION:  1.  Stable support apparatus. 2.  Persistent interstitial and airspace process in the lungs and the small bilateral effusions.   Original Report Authenticated By: Rudie Meyer, M.D.    Portable Chest Xray In Am  09/04/2012  *RADIOLOGY REPORT*  Clinical Data: Evaluate pulmonary edema, endotracheal tube  PORTABLE CHEST - 1 VIEW  Comparison: 09/03/2012 (multiple examinations); 09/02/2012; 08/30/2012  Findings: Grossly unchanged enlarged cardiac silhouette and mediastinal contours with tortuosity  within the thoracic aorta. Atherosclerotic calcifications in the aortic arch.  The pulmonary vasculature remains indistinct.  Grossly unchanged perihilar heterogeneous air space opacities, right greater than left.  No definite pleural effusion or pneumothorax.  Unchanged bones.  IMPRESSION: 1.  Stable positioning of support apparatus.  No pneumothorax. 2.  Grossly unchanged perihilar air space opacities which given rapidity of onset is favored to represent pulmonary edema, though note, underlying infection and/or aspiration is not excluded.   Original Report Authenticated By: Tacey Ruiz, MD    Portable Chest Xray To Verify Ett/ Central Line Placement  09/03/2012  *RADIOLOGY REPORT*  Clinical Data: ET tube placement.  PORTABLE CHEST - 1 VIEW  Comparison: Chest 09/03/2012 at.  Findings: Endotracheal tube is in place with tip just below the clavicular heads in good position.  NG tube courses into the stomach.  Bilateral airspace disease has improved.  No pneumothorax identified.  There appear be small bilateral pleural effusions.  IMPRESSION:  1.  ET tube in good position. 2.  Improved bilateral airspace disease.   Original Report Authenticated By: Holley Dexter, M.D.    Assessment and Plan   1. Diverticulitis with bowel microperforation, SBO - surgery following, on antibiotics.  2. Acute respiratory failure, requiring intubation in setting of abdominal process, afib RVR, cardiogenic pulmonary edema - s/p extubation.  3. Paroxysmal atrial fib/atrial flutter - likely exacerbated by acute illness. CHADS2 = 1 at present for CHF; Would not fully anticoagulate in setting of acute abdominal illness.  Continues on IV amiodarone as not yet taking POs  HR better controlled today 4. Elevated troponins with possible underlying CAD - trop 0.09 at [facility]. Troponins negative here. Atherosclerotic dz on CT at that time. Will likely need cath when clinical status improves given degree of LV dysfunction EF 20-25% by  echo in [place]. Primary team - are we OK to start maintenance aspirin? 5. 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 No ACEI/ARB due to hypotension. See above re: BB.  6. Elevated PT/INR - improved.  7. Hypotension - ? Sepsis picture - note blood cx results with gram positive cocci. Abx mgmt per primary team. BP improved since yesterday.  8. Mod pHTN - possibly r/t longstanding tobacco abuse or LV dysfunction.  9. NSVT - follow labs. Cath when more stable. See above re: BB.  10. Hypernatremia - lytes, free water per PCCM.  11.  Hypokalemia - replete       Charlton Haws, MD  09/08/2012  9:51 AM

## 2012-09-08 NOTE — Progress Notes (Signed)
SPOKE WITH STAFF RN JUDITH WHO STATES MD AWARE OF RIGHT CENTRAL LINE TIP LOCATION AND MD WANTS TO LEAVE CENTRAL LINE AS IS, EXPLAINED THAT AN ORDER WILL HAVE TO BE OBTAINED FROM MD TO USE CENTRAL LINE WITH TIP IN THE INNOMINATE VEIN, JUDITH, RN TO CONTACT MD

## 2012-09-08 NOTE — Progress Notes (Addendum)
[facility]             Progress Note   Subjective: Feels much better. No abdominal pain.   Objective: Vital signs in last 24 hours: Temp:  [97.5 F (36.4 C)-98.4 F (36.9 C)] 98.4 F (36.9 C) (12/22 0600) Pulse Rate:  [78-89] 82  (12/22 0600) Resp:  [14-20] 14  (12/22 0600) BP: (104-124)/(67-87) 114/68 mmHg (12/22 0600) SpO2:  [92 %-93 %] 93 % (12/22 0600) Weight change:  Last BM Date: 09/08/12  Intake/Output from previous day: 12/21 0701 - 12/22 0700 In: 4131.5 [P.O.:1432; I.V.:2249.5; IV Piggyback:450] Out: 800 [Urine:800] Total I/O In: 600 [P.O.:600] Out: -    Physical Exam: General: Alert, awake, oriented x3, in no acute distress. HEENT: No bruits, no goiter. Heart: Regular rate and rhythm, without murmurs, rubs, gallops. Lungs: Clear to auscultation bilaterally. Abdomen: Soft, nontender, nondistended, positive bowel sounds. Extremities: No clubbing cyanosis or edema with positive pedal pulses. Neuro: Grossly intact, nonfocal.    Lab Results: Basic Metabolic Panel:  Basename 09/08/12 0911 09/07/12 1000 09/07/12 0600 09/06/12 0420  NA 141 -- 145 --  K 2.7* -- 2.8* --  CL 98 -- 103 --  CO2 31 -- 31 --  GLUCOSE 150* -- 108* --  BUN 12 -- 17 --  CREATININE 1.04 -- 0.92 --  CALCIUM 8.3* -- 8.2* --  MG -- 1.8 -- 1.9  PHOS -- -- -- 2.5   Liver Function Tests:  [facility] 09/06/12 0420  AST 38*  ALT 18  ALKPHOS 55  BILITOT 0.4  PROT 6.0  ALBUMIN 2.2*   CBC:  Basename 09/08/12 0911 09/07/12 0600  WBC 12.0* 12.9*  NEUTROABS 9.9* 10.7*  HGB 14.8 15.0  HCT 43.4 44.8  MCV 94.8 95.7  PLT 325 337   CBG:  Basename 09/08/12 0735 09/08/12 0427 09/08/12 0010 09/07/12 2012 09/07/12 1710 09/07/12 1146  GLUCAP 107* 115* 120* 103* 124* 141*     Recent Results (from the past 240 hour(s))  MRSA PCR SCREENING     Status: Normal   Collection Time   09/03/12  3:24 PM      Component Value Range Status Comment   MRSA by PCR NEGATIVE  NEGATIVE  Final   URINE CULTURE     Status: Normal   Collection Time   09/03/12  9:15 PM      Component Value Range Status Comment   Specimen Description URINE, CATHETERIZED   Final    Special Requests NONE   Final    Culture  Setup Time 09/03/2012 22:03   Final    Colony Count NO GROWTH   Final    Culture NO GROWTH   Final    Report Status 09/05/2012 FINAL   Final   CULTURE, RESPIRATORY     Status: Normal   Collection Time   09/04/12 12:51 PM      Component Value Range Status Comment   Specimen Description ENDOTRACHEAL   Final    Special Requests Normal   Final    Gram Stain     Final    Value: FEW WBC PRESENT, PREDOMINANTLY MONONUCLEAR     NO SQUAMOUS EPITHELIAL CELLS SEEN     NO ORGANISMS SEEN   Culture FEW CANDIDA ALBICANS   Final    Report Status 09/06/2012 FINAL   Final   CULTURE, BLOOD (ROUTINE X 2)     Status: Normal (Preliminary result)   Collection Time   09/04/12  3:50 PM      Component  Value Range Status Comment   Specimen Description BLOOD LEFT ARM   Final    Special Requests BOTTLES DRAWN AEROBIC AND ANAEROBIC 10CC   Final    Culture  Setup Time 09/05/2012 00:12   Final    Culture     Final    Value:        BLOOD CULTURE RECEIVED NO GROWTH TO DATE CULTURE WILL BE HELD FOR 5 DAYS BEFORE ISSUING A FINAL NEGATIVE REPORT   Report Status PENDING   Incomplete   CULTURE, BLOOD (ROUTINE X 2)     Status: Normal   Collection Time   09/04/12  4:00 PM      Component Value Range Status Comment   Specimen Description BLOOD LEFT ARM   Final    Special Requests BOTTLES DRAWN AEROBIC AND ANAEROBIC 10CC   Final    Culture  Setup Time 09/05/2012 00:10   Final    Culture     Final    Value: STAPHYLOCOCCUS SPECIES (COAGULASE NEGATIVE)     Note: THE SIGNIFICANCE OF ISOLATING THIS ORGANISM FROM A SINGLE SET OF BLOOD CULTURES WHEN MULTIPLE SETS ARE DRAWN IS UNCERTAIN. PLEASE NOTIFY THE MICROBIOLOGY DEPARTMENT WITHIN ONE WEEK IF SPECIATION AND SENSITIVITIES ARE REQUIRED.     Note: Gram Stain  Report Called to,Read Back By and Verified With: ALEX [facility] ON 09/05/12 AT 11:06P BY WILEJ   Report Status 09/07/2012 FINAL   Final     Studies/Results: Dg Chest Port 1 View  09/08/2012  **ADDENDUM** CREATED: 09/08/2012 08:59:42  The tip of the indwelling right subclavian central venous catheter projects over the right innominate vein.  **END ADDENDUM** SIGNED BY: Arnell Sieving, M.D.   09/08/2012  *RADIOLOGY REPORT*  Clinical Data: Evaluate pulmonary edema  PORTABLE CHEST - 1 VIEW  Comparison: 09/06/2012; 09/05/2012; 09/04/2012  Findings:  Grossly unchanged enlarged cardiac silhouette and mediastinal contours with atherosclerotic calcifications within a mildly tortuous thoracic aorta.  Interval removal of enteric tube. Otherwise, stable positioning of support apparatus.  Improved aeration of the lungs with persistent bibasilar opacities, left greater than right. Cephalization of flow without frank evidence of edema.  No definite pleural effusion or pneumothorax.  Unchanged bones.  IMPRESSION: 1.  Improved aeration of the lungs with persistent bibasilar opacity favored to represent atelectasis.  2.  Pulmonary venous congestion without frank evidence of edema.  Original Report Authenticated By: Tacey Ruiz, MD     Medications: Scheduled Meds:    . antiseptic oral rinse  15 mL Mouth Rinse q12n4p  . chlorhexidine  15 mL Mouth Rinse BID  . furosemide  40 mg Intravenous Daily  . heparin subcutaneous  5,000 Units Subcutaneous Q8H  . metoprolol  2.5 mg Intravenous Q6H  . pantoprazole (PROTONIX) IV  40 mg Intravenous QHS  . piperacillin-tazobactam (ZOSYN)  IV  3.375 g Intravenous Q8H  . potassium chloride  40 mEq Oral Q4H  . vancomycin  750 mg Intravenous Q12H   Continuous Infusions:    . amiodarone (NEXTERONE PREMIX) 360 mg/200 mL dextrose 0.5 mg/min (09/07/12 2346)  . dextrose 40 mL/hr at 09/07/12 1900   PRN Meds:.sodium chloride, influenza  inactive virus vaccine,  metoprolol  Assessment/Plan:  Principal Problem:  *Diverticulitis Active Problems:  Acute respiratory failure due to pulmonary edema  Paroxysmal AF  Cardiomyopathy  Smoker  Bowel microperforation  Positive blood culture  Hypernatremia  Acute systolic CHF (congestive heart failure)  Hypokalemia   Sigmoid Colon Diverticulitis with Microperforation -Abdominal pain improving. -Tolerating clears. -Advance to  regular diet today. -Continue vanc and zosyn for now.  Coag Negative Staph Bacteremia -Likely a contaminant as only 1/2 BC. -Will DC vanc. -Afebrile.  Acute Respiratory Failure -Presumed 2/2 pulmonary edema in the setting of reduced EF (?new). -Is now extubated. -Will likely need ischemic evaluation after abdominal process improved (?timing). -Add lasix 40 q day. -Repeat CXR shows continued improvement in edema.  Hypokalemia -2.7. -Replete PO. -Mg ok at 1.8.  A Fib -Likely exacerbated by acute illness. -On IV amiodarone. -Now that he is tolerating POs, can likely convert to oral meds...will defer to cardiology as they are also on board.  Hypernatremia -Resolved. -Na is 141 today.   Time spent coordinating care: 25 minutes.   LOS: 5 days   [facility] Pager: [phone] 09/08/2012, 11:29 AM

## 2012-09-08 NOTE — Progress Notes (Signed)
PER XRAY FOR 09/08/2012, RIGHT [place] TL CENTRAL LINE TIP IN RIGHT INNOMINATE VEIN, PER POLICY TIP SHOULD BE LOCATED IN SVC  OR CAJ , PT IS GETTING VANCOMYCIN, SUGGEST REPLACING CENTRAL LINE FOR PROPER PLACEMENT OR PLACE PICC LINE, STAFF RN MADE AWARE OF TIP PLACEMENT

## 2012-09-08 NOTE — Progress Notes (Signed)
CRITICAL VALUE ALERT  Critical value received:  K 7.2 Date of notification:  09/08/12   Time of notification: 9:00  Critical value read back:yes  Nurse who received alert:  Lethea Killings  MD notified (1st page):  Dr Ardyth Harps  Time of first page:  9:45  MD notified (2nd page):  Time of second page  Responding MD:  Dr Ardyth Harps  Time MD responded:  9:15

## 2012-09-08 NOTE — Progress Notes (Signed)
Patient ID: Ronnie Harvey, male   DOB: 11-29-1945, 66 y.o.   MRN: [id number]    Subjective: Denies abdominal pain or nausea  Objective: Vital signs in last 24 hours: Temp:  [97.5 F (36.4 C)-98.4 F (36.9 C)] 98.4 F (36.9 C) (12/22 0600) Pulse Rate:  [78-89] 82  (12/22 0600) Resp:  [14-20] 14  (12/22 0600) BP: (104-124)/(67-87) 114/68 mmHg (12/22 0600) SpO2:  [92 %-93 %] 93 % (12/22 0600) Last BM Date: 09/08/12  Intake/Output from previous day: 12/21 0701 - 12/22 0700 In: 4131.5 [P.O.:1432; I.V.:2249.5; IV Piggyback:450] Out: 800 [Urine:800] Intake/Output this shift: Total I/O In: 600 [P.O.:600] Out: -   General appearance: alert, cooperative and no distress GI: abnormal findings:  mild tenderness in the LLQ and no guarding  Lab Results:   Basename 09/07/12 0600 09/06/12 0420  WBC 12.9* 10.1  HGB 15.0 14.5  HCT 44.8 43.8  PLT 337 338   BMET  Basename 09/07/12 0600 09/06/12 0420  NA 145 151*  K 2.8* 3.6  CL 103 108  CO2 31 34*  GLUCOSE 108* 108*  BUN 17 23  CREATININE 0.92 0.94  CALCIUM 8.2* 8.4     Studies/Results: Dg Chest Port 1 View  09/08/2012  *RADIOLOGY REPORT*  Clinical Data: Evaluate pulmonary edema  PORTABLE CHEST - 1 VIEW  Comparison: 09/06/2012; 09/05/2012; 09/04/2012  Findings:  Grossly unchanged enlarged cardiac silhouette and mediastinal contours with atherosclerotic calcifications within a mildly tortuous thoracic aorta.  Interval removal of enteric tube. Otherwise, stable positioning of support apparatus.  Improved aeration of the lungs with persistent bibasilar opacities, left greater than right. Cephalization of flow without frank evidence of edema.  No definite pleural effusion or pneumothorax.  Unchanged bones.  IMPRESSION: 1.  Improved aeration of the lungs with persistent bibasilar opacity favored to represent atelectasis.  2.  Pulmonary venous congestion without frank evidence of edema.   Original Report Authenticated By: Tacey Ruiz, MD      Anti-infectives: Anti-infectives     Start     Dose/Rate Route Frequency Ordered Stop   09/06/12 1500   vancomycin (VANCOCIN) 750 mg in sodium chloride 0.9 % 150 mL IVPB        750 mg 150 mL/hr over 60 Minutes Intravenous Every 12 hours 09/06/12 1352     09/05/12 1100  piperacillin-tazobactam (ZOSYN) IVPB 3.375 g       3.375 g 12.5 mL/hr over 240 Minutes Intravenous Every 8 hours 09/05/12 0907     09/03/12 1615   ertapenem (INVANZ) 1 g in sodium chloride 0.9 % 50 mL IVPB  Status:  Discontinued        1 g 100 mL/hr over 30 Minutes Intravenous Every 24 hours 09/03/12 1603 09/05/12 0906          Assessment/Plan: Diverticulitis with microperforation.  Improved on abx Recent CT showing only thickening sigmoid colon Cont IV abx for now.  WBC increased slightly yesterday, repeat VDRF, resolved, cardiac issues being evaluated    LOS: 5 days    [name] 09/08/2012

## 2012-09-09 DIAGNOSIS — E876 Hypokalemia: Secondary | ICD-10-CM

## 2012-09-09 LAB — BASIC METABOLIC PANEL
BUN: 12 mg/dL (ref 6–23)
CO2: 31 mEq/L (ref 19–32)
Calcium: 8.3 mg/dL — ABNORMAL LOW (ref 8.4–10.5)
GFR calc non Af Amer: 68 mL/min — ABNORMAL LOW (ref >90)
Glucose, Bld: 107 mg/dL — ABNORMAL HIGH (ref 70–99)

## 2012-09-09 LAB — CLOSTRIDIUM DIFFICILE BY PCR: Toxigenic C. Difficile by PCR: NEGATIVE

## 2012-09-09 LAB — CBC
Hemoglobin: 14.6 g/dL (ref 13.0–17.0)
MCH: 32.4 pg (ref 26.0–34.0)
MCHC: 34.1 g/dL (ref 30.0–36.0)
MCV: 94.9 fL (ref 78.0–100.0)
RBC: 4.51 MIL/uL (ref 4.22–5.81)

## 2012-09-09 MED ORDER — MAGNESIUM OXIDE 400 (241.3 MG) MG PO TABS
400.0000 mg | ORAL_TABLET | Freq: Two times a day (BID) | ORAL | Status: DC
  Administered 2012-09-09 – 2012-09-11 (×5): 400 mg via ORAL
  Filled 2012-09-09 (×6): qty 1

## 2012-09-09 MED ORDER — POTASSIUM CHLORIDE CRYS ER 20 MEQ PO TBCR
40.0000 meq | EXTENDED_RELEASE_TABLET | Freq: Once | ORAL | Status: AC
  Administered 2012-09-09: 40 meq via ORAL
  Filled 2012-09-09: qty 2

## 2012-09-09 MED ORDER — FUROSEMIDE 10 MG/ML IJ SOLN
40.0000 mg | Freq: Every day | INTRAMUSCULAR | Status: DC
  Administered 2012-09-10: 40 mg via INTRAVENOUS
  Filled 2012-09-09: qty 4

## 2012-09-09 MED ORDER — FUROSEMIDE 10 MG/ML IJ SOLN
40.0000 mg | Freq: Two times a day (BID) | INTRAMUSCULAR | Status: DC
Start: 2012-09-09 — End: 2012-09-09

## 2012-09-09 NOTE — Progress Notes (Signed)
Agree with A&P of JD,NP. He believes he is doing well and wants to go home. Will need to keep on antibiotics two weeks. He can follow up in our office with Dr Johna Sheriff if surgical F/U needed

## 2012-09-09 NOTE — Progress Notes (Signed)
Patient ID: Ronnie Harvey, male   DOB: [birth year], 66 y.o.   MRN: [id number]    Subjective: Denies abdominal pain or nausea, tolerating regular diet. + BM and flatus.  Objective: Vital signs in last 24 hours: Temp:  [97.8 F (36.6 C)-98.4 F (36.9 C)] 98.4 F (36.9 C) (12/23 0600) Pulse Rate:  [70-84] 84  (12/23 0600) Resp:  [14-18] 16  (12/23 0600) BP: (106-135)/(67-90) 121/81 mmHg (12/23 0600) SpO2:  [91 %-96 %] 96 % (12/23 0600) Last BM Date: 09/08/12  Intake/Output from previous day: 12/22 0701 - 12/23 0700 In: 1440 [P.O.:1440] Out: -  Intake/Output this shift:    General appearance: alert, cooperative and no distress GI: abnormal findings:  mild tenderness in the LLQ and no guarding + BM and flatus. Labs: WBC count trending up, H&H stable. Hypokalemic (repleting) VSS, afebrile  Lab Results:   Basename 09/09/12 0500 09/08/12 0911  WBC 14.3* 12.0*  HGB 14.6 14.8  HCT 42.8 43.4  PLT 298 325   BMET  Basename 09/09/12 0500 09/08/12 0911  NA 140 141  K 3.3* 2.7*  CL 100 98  CO2 31 31  GLUCOSE 107* 150*  BUN 12 12  CREATININE 1.10 1.04  CALCIUM 8.3* 8.3*     Studies/Results: Dg Chest Port 1 View  09/08/2012  **ADDENDUM** CREATED: 09/08/2012 08:59:42  The tip of the indwelling right subclavian central venous catheter projects over the right innominate vein.  **END ADDENDUM** SIGNED BY: Arnell Sieving, M.D.   09/08/2012  *RADIOLOGY REPORT*  Clinical Data: Evaluate pulmonary edema  PORTABLE CHEST - 1 VIEW  Comparison: 09/06/2012; 09/05/2012; 09/04/2012  Findings:  Grossly unchanged enlarged cardiac silhouette and mediastinal contours with atherosclerotic calcifications within a mildly tortuous thoracic aorta.  Interval removal of enteric tube. Otherwise, stable positioning of support apparatus.  Improved aeration of the lungs with persistent bibasilar opacities, left greater than right. Cephalization of flow without frank evidence of edema.  No definite pleural  effusion or pneumothorax.  Unchanged bones.  IMPRESSION: 1.  Improved aeration of the lungs with persistent bibasilar opacity favored to represent atelectasis.  2.  Pulmonary venous congestion without frank evidence of edema.  Original Report Authenticated By: Tacey Ruiz, MD     Anti-infectives: Anti-infectives     Start     Dose/Rate Route Frequency Ordered Stop   09/06/12 1500   vancomycin (VANCOCIN) 750 mg in sodium chloride 0.9 % 150 mL IVPB  Status:  Discontinued        750 mg 150 mL/hr over 60 Minutes Intravenous Every 12 hours 09/06/12 1352 09/08/12 1133   09/05/12 1100  piperacillin-tazobactam (ZOSYN) IVPB 3.375 g       3.375 g 12.5 mL/hr over 240 Minutes Intravenous Every 8 hours 09/05/12 0907     09/03/12 1615   ertapenem (INVANZ) 1 g in sodium chloride 0.9 % 50 mL IVPB  Status:  Discontinued        1 g 100 mL/hr over 30 Minutes Intravenous Every 24 hours 09/03/12 1603 09/05/12 0906          Assessment/Plan: Diverticulitis with microperforation.  Improved on abx Recent CT showing only thickening sigmoid colon Cont IV abx for now, recheck labs in am. VDRF, resolved, cardiac issues being evaluated Patient is stable from a surgical standpoint so we will sign off at this time, please reconsult if patient's condition worsens.   LOS: 6 days    Golda Acre [facility] Pager # [phone] 09/09/2012

## 2012-09-09 NOTE — Progress Notes (Signed)
09-09-12   1410   IV Team Note;  Called to dc pt's central line that was placed 09-03-12;  cxr rept states tip projects over the inominate vein;   Pt has a peripheral iv, however, he is still on iv amiodarone, lasix, protonix and zosyn;  Spoke with RN caring for pt; suggest keeping central access, especially with pt on iv amiodarone, as there is no antidote if the piv should infltrate.   RN to notify MD ;    Suggest picc line if pt will need continued iv access for meds;  Please advise;  Thank you!    Barkley Bruns RN  IV Team

## 2012-09-09 NOTE — Progress Notes (Signed)
Pt having frequent loose stools. Lenny Pastel NP notified and orders received. Will send a stool sample and cont to monitor. Pt placed on isolation per orders awaiting test result. Pt educated on need for isolation and expressed understanding.

## 2012-09-09 NOTE — Progress Notes (Signed)
Physical Therapy Treatment Patient Details Name: Ronnie Harvey MRN: [id number] DOB: 03-05-1946 Today's Date: 09/09/2012 Time: 6578-4696 PT Time Calculation (min): 24 min  PT Assessment / Plan / Recommendation Comments on Treatment Session  66 y.o. male admitted to [facility] for diverticulitis with microperforation.  Course complicated by AFRVR, decompensation of respiratory status requiring intubation 12/17.  He now on a normal floor and deconditioned from being in the bed and being ill.  His gait is improving, but still mildly unsteady on his feet.  Encouraged him to walk with his family 2 more times today in the hallway.  RN aware and agreeable to this plan.  Pt will continue to follow pt acutely for gait balance and endurance training exercises, but he will not need any HHPT f/u at discahrge.      Follow Up Recommendations  No PT follow up           Equipment Recommendations  None recommended by PT       Frequency Min 3X/week   Plan Frequency remains appropriate;Discharge plan needs to be updated       Pertinent Vitals/Pain No reports of pain, O2 sats lowest 91%-98% on RA during activity.  HR max 106 bpm.      Mobility  Bed Mobility Supine to Sit: 7: Independent Sitting - Scoot to Edge of Bed: 7: Independent Transfers Sit to Stand: 5: Supervision;From bed;With upper extremity assist Stand to Sit: 5: Supervision;With upper extremity assist;With armrests;To chair/3-in-1 Details for Transfer Assistance: supervision for safety due to heavy reliance on hands for balance Ambulation/Gait Ambulation/Gait Assistance: 4: Min guard Ambulation Distance (Feet): 300 Feet Assistive device: None Ambulation/Gait Assistance Details: min guard assist due to mildly staggering gait pattern Gait Pattern: Wide base of support (feet externally rotated bil) Gait velocity: 2.40 ft/sec General Gait Details: Increased DOE with gait.  Fast speed.  O2 sats down to 91 % during gait on RA.  DOE 3/4     Exercises General Exercises - Lower Extremity Hip ABduction/ADduction: AROM;Both;10 reps;Standing Other Exercises Other Exercises: squats with hands on sink (almost down to the floor) x 10 reps Other Exercises: heel raises x 10 reps at sink  Other Exercises: tandem stand bil at sink 30 sec holds bil legs Other Exercises: seated rest breaks needed every 2 exercises due to increased DOE.     PT Goals Acute Rehab PT Goals PT Goal: Supine/Side to Sit - Progress: Met PT Goal: Sit to Stand - Progress: Progressing toward goal PT Goal: Ambulate - Progress: Progressing toward goal  Visit Information  Last PT Received On: 09/23/12 Assistance Needed: +1    Subjective Data  Subjective: Pt with odd sense of humor   Cognition  Overall Cognitive Status: Appears within functional limits for tasks assessed/performed Arousal/Alertness: Awake/alert       End of Session PT - End of Session Activity Tolerance: Patient limited by fatigue Patient left: in chair;with call bell/phone within reach;with nursing in room;with family/visitor present Nurse Communication: Mobility status (asked RN to let famliy walk with pt in hall)   GP     Lurena Joiner B. Jahvon Gosline, PT, DPT [phone]   09/09/2012, 10:36 AM

## 2012-09-09 NOTE — Progress Notes (Signed)
Patient ID: Ronnie Harvey, male   DOB: 1946-01-13, 66 y.o.   MRN: [id number]   Cardiomyopathy:    If possible would like to proceed with cardiac catheterization before he goes home. However, he needs to be completely recovered. Will follow his care. It may be more prudent to risk stratify him with a nuclear study with possible catheterization later. We'll follow the patient as he continues to improve and make decisions.  Amiodarone therapy:     My preference would be to continue IV amiodarone until the patient is taking a complete diet and has shown that his GI status is completely stable.  Jerral Bonito, MD

## 2012-09-09 NOTE — Progress Notes (Signed)
[facility] PROGRESS NOTE Assessment/Plan: Acute Diverticulitis with microperforation: - Abdominal pain improving.  - Tolerating clears. zosyn 12.18.2013 - change antibiotics to Cipro and flagyl for 2 weeks once ready to go home.  Acute respiratory failure due to pulmonary edema (09/03/2012)/Cardiomyopathy (09/03/2012) - Will likely need cardiac evaluation when clinical status improves given degree of LV dysfunction. - Presumed 2/2 pulmonary edema in the setting of reduced EF 20 moderately dilated, with global hypokinesia, restrict fluids. - Extubated 12.19.2013 - good diuresis increase lasix to BID. CXR shows continued improvement in edema. KCL daily.  A Fib  - Likely exacerbated by acute illness.  - On IV amiodarone, agree with cards.  Hypernatremia (09/06/2012): - resolved. With IV lasix.  Hypokalemia (09/08/2012) - replete, will need schedule KCL for home. - check a mag. level.  Code Status: full Family Communication: spouse Disposition Plan: TBD   Consultants:  Cardiology  surgery  Procedures:  Echo 12.19.2013: EF 20 % global hypokanesia  Antibiotics:  zosyn (indicate start date, and stop date if known)  HPI/Subjective: Feels better tolerating diet.  Objective: Filed Vitals:   09/08/12 2130 09/08/12 2320 09/09/12 0600 09/09/12 1025  BP: 127/85 135/90 121/81   Pulse: 83 84 84 106  Temp: 98.3 F (36.8 C)  98.4 F (36.9 C)   TempSrc:      Resp: 14  16   Height:      Weight:      SpO2: 91%  96% 91%    Intake/Output Summary (Last 24 hours) at 09/09/12 1208 Last data filed at 09/09/12 0800  Gross per 24 hour  Intake   1200 ml  Output      0 ml  Net   1200 ml   Filed Weights   09/04/12 0400 09/05/12 0404 09/06/12 0400  Weight: 71.1 kg (156 lb 12 oz) 69.6 kg (153 lb 7 oz) 66.1 kg (145 lb 11.6 oz)    Exam:  General: Alert, awake, oriented x3, in no acute distress.  HEENT: No bruits, no goiter. +JVD Heart: Regular rate and rhythm,  without murmurs, rubs, gallops.  Lungs: Good air movement, crackles at bases Abdomen: Soft, nontender, nondistended, positive bowel sounds.    Data Reviewed: Basic Metabolic Panel:  Lab 09/09/12 1610 09/08/12 1150 09/08/12 0911 09/07/12 1000 09/07/12 0600 09/06/12 0420 09/05/12 1735 09/03/12 2042  NA 140 -- 141 -- 145 151* 151* --  K 3.3* -- 2.7* -- 2.8* 3.6 3.2* --  CL 100 -- 98 -- 103 108 108 --  CO2 31 -- 31 -- 31 34* 31 --  GLUCOSE 107* -- 150* -- 108* 108* 120* --  BUN 12 -- 12 -- 17 23 27* --  CREATININE 1.10 -- 1.04 -- 0.92 0.94 0.91 --  CALCIUM 8.3* -- 8.3* -- 8.2* 8.4 8.6 --  MG -- 1.7 -- 1.8 -- 1.9 -- 2.1  PHOS -- -- -- -- -- 2.5 -- --   Liver Function Tests:  Lab 09/06/12 0420 09/05/12 0400 09/04/12 0400 09/03/12 2042  AST 38* 29 21 19   ALT 18 13 13 13   ALKPHOS 55 50 59 55  BILITOT 0.4 0.3 0.3 0.3  PROT 6.0 5.7* 6.0 5.7*  ALBUMIN 2.2* 2.0* 2.1* 2.0*   No results found for this basename: LIPASE:5,AMYLASE:5 in the last 168 hours No results found for this basename: AMMONIA:5 in the last 168 hours CBC:  Lab 09/09/12 0500 09/08/12 0911 09/07/12 0600 09/06/12 0420 09/05/12 0400  WBC 14.3* 12.0* 12.9* 10.1 8.8  NEUTROABS -- 9.9* 10.7*  8.0* 7.5  HGB 14.6 14.8 15.0 14.5 14.2  HCT 42.8 43.4 44.8 43.8 42.9  MCV 94.9 94.8 95.7 97.6 97.7  PLT 298 325 337 338 322   Cardiac Enzymes:  Lab 09/04/12 0400 09/03/12 2118 09/03/12 1558  CKTOTAL -- -- --  CKMB -- -- --  CKMBINDEX -- -- --  TROPONINI <0.30 <0.30 <0.30   BNP (last 3 results)  Basename 09/03/12 1558  PROBNP 7946.0*   CBG:  Lab 09/08/12 1540 09/08/12 1233 09/08/12 0735 09/08/12 0427 09/08/12 0010  GLUCAP 142* 124* 107* 115* 120*    Recent Results (from the past 240 hour(s))  MRSA PCR SCREENING     Status: Normal   Collection Time   09/03/12  3:24 PM      Component Value Range Status Comment   MRSA by PCR NEGATIVE  NEGATIVE Final   URINE CULTURE     Status: Normal   Collection Time   09/03/12  9:15  PM      Component Value Range Status Comment   Specimen Description URINE, CATHETERIZED   Final    Special Requests NONE   Final    Culture  Setup Time 09/03/2012 22:03   Final    Colony Count NO GROWTH   Final    Culture NO GROWTH   Final    Report Status 09/05/2012 FINAL   Final   CULTURE, RESPIRATORY     Status: Normal   Collection Time   09/04/12 12:51 PM      Component Value Range Status Comment   Specimen Description ENDOTRACHEAL   Final    Special Requests Normal   Final    Gram Stain     Final    Value: FEW WBC PRESENT, PREDOMINANTLY MONONUCLEAR     NO SQUAMOUS EPITHELIAL CELLS SEEN     NO ORGANISMS SEEN   Culture FEW CANDIDA ALBICANS   Final    Report Status 09/06/2012 FINAL   Final   CULTURE, BLOOD (ROUTINE X 2)     Status: Normal (Preliminary result)   Collection Time   09/04/12  3:50 PM      Component Value Range Status Comment   Specimen Description BLOOD LEFT ARM   Final    Special Requests BOTTLES DRAWN AEROBIC AND ANAEROBIC 10CC   Final    Culture  Setup Time 09/05/2012 00:12   Final    Culture     Final    Value:        BLOOD CULTURE RECEIVED NO GROWTH TO DATE CULTURE WILL BE HELD FOR 5 DAYS BEFORE ISSUING A FINAL NEGATIVE REPORT   Report Status PENDING   Incomplete   CULTURE, BLOOD (ROUTINE X 2)     Status: Normal   Collection Time   09/04/12  4:00 PM      Component Value Range Status Comment   Specimen Description BLOOD LEFT ARM   Final    Special Requests BOTTLES DRAWN AEROBIC AND ANAEROBIC 10CC   Final    Culture  Setup Time 09/05/2012 00:10   Final    Culture     Final    Value: STAPHYLOCOCCUS SPECIES (COAGULASE NEGATIVE)     Note: THE SIGNIFICANCE OF ISOLATING THIS ORGANISM FROM A SINGLE SET OF BLOOD CULTURES WHEN MULTIPLE SETS ARE DRAWN IS UNCERTAIN. PLEASE NOTIFY THE MICROBIOLOGY DEPARTMENT WITHIN ONE WEEK IF SPECIATION AND SENSITIVITIES ARE REQUIRED.     Note: Gram Stain Report Called to,Read Back By and Verified With: ALEX SHYSHKO ON 09/05/12 AT  11:06P BY WILEJ   Report Status 09/07/2012 FINAL   Final   CLOSTRIDIUM DIFFICILE BY PCR     Status: Normal   Collection Time   09/08/12 10:35 PM      Component Value Range Status Comment   C difficile by pcr NEGATIVE  NEGATIVE Final      Studies: Dg Chest Port 1 View  09/08/2012  **ADDENDUM** CREATED: 09/08/2012 08:59:42  The tip of the indwelling right subclavian central venous catheter projects over the right innominate vein.  **END ADDENDUM** SIGNED BY: Arnell Sieving, M.D.   09/08/2012  *RADIOLOGY REPORT*  Clinical Data: Evaluate pulmonary edema  PORTABLE CHEST - 1 VIEW  Comparison: 09/06/2012; 09/05/2012; 09/04/2012  Findings:  Grossly unchanged enlarged cardiac silhouette and mediastinal contours with atherosclerotic calcifications within a mildly tortuous thoracic aorta.  Interval removal of enteric tube. Otherwise, stable positioning of support apparatus.  Improved aeration of the lungs with persistent bibasilar opacities, left greater than right. Cephalization of flow without frank evidence of edema.  No definite pleural effusion or pneumothorax.  Unchanged bones.  IMPRESSION: 1.  Improved aeration of the lungs with persistent bibasilar opacity favored to represent atelectasis.  2.  Pulmonary venous congestion without frank evidence of edema.  Original Report Authenticated By: Tacey Ruiz, MD     Scheduled Meds:   . antiseptic oral rinse  15 mL Mouth Rinse q12n4p  . chlorhexidine  15 mL Mouth Rinse BID  . furosemide  40 mg Intravenous Daily  . heparin subcutaneous  5,000 Units Subcutaneous Q8H  . metoprolol  2.5 mg Intravenous Q6H  . pantoprazole (PROTONIX) IV  40 mg Intravenous QHS  . piperacillin-tazobactam (ZOSYN)  IV  3.375 g Intravenous Q8H   Continuous Infusions:   . amiodarone (NEXTERONE PREMIX) 360 mg/200 mL dextrose 0.5 mg/min (09/08/12 2324)  . dextrose 40 mL/hr at 09/07/12 1900     FELIZ Rosine Beat  [facility] Pager [phone]. If 8PM-8AM, please  contact night-coverage at [email or web address], password [facility] 09/09/2012, 12:08 PM  LOS: 6 days

## 2012-09-10 LAB — BASIC METABOLIC PANEL
CO2: 29 mEq/L (ref 19–32)
Calcium: 8.4 mg/dL (ref 8.4–10.5)
Creatinine, Ser: 1.15 mg/dL (ref 0.50–1.35)
GFR calc non Af Amer: 64 mL/min — ABNORMAL LOW (ref >90)
Glucose, Bld: 104 mg/dL — ABNORMAL HIGH (ref 70–99)
Sodium: 142 mEq/L (ref 135–145)

## 2012-09-10 MED ORDER — LISINOPRIL 2.5 MG PO TABS
2.5000 mg | ORAL_TABLET | Freq: Every day | ORAL | Status: DC
  Administered 2012-09-10 – 2012-09-11 (×2): 2.5 mg via ORAL
  Filled 2012-09-10 (×2): qty 1

## 2012-09-10 MED ORDER — METRONIDAZOLE 500 MG PO TABS
500.0000 mg | ORAL_TABLET | Freq: Three times a day (TID) | ORAL | Status: DC
  Administered 2012-09-10 – 2012-09-11 (×4): 500 mg via ORAL
  Filled 2012-09-10 (×8): qty 1

## 2012-09-10 MED ORDER — CARVEDILOL 3.125 MG PO TABS
3.1250 mg | ORAL_TABLET | Freq: Two times a day (BID) | ORAL | Status: DC
  Administered 2012-09-10 – 2012-09-11 (×3): 3.125 mg via ORAL
  Filled 2012-09-10 (×5): qty 1

## 2012-09-10 MED ORDER — FUROSEMIDE 40 MG PO TABS
40.0000 mg | ORAL_TABLET | Freq: Every day | ORAL | Status: DC
  Administered 2012-09-11: 40 mg via ORAL
  Filled 2012-09-10 (×2): qty 1

## 2012-09-10 MED ORDER — CIPROFLOXACIN HCL 500 MG PO TABS
500.0000 mg | ORAL_TABLET | Freq: Two times a day (BID) | ORAL | Status: DC
  Administered 2012-09-10 – 2012-09-11 (×3): 500 mg via ORAL
  Filled 2012-09-10 (×5): qty 1

## 2012-09-10 MED ORDER — AMIODARONE HCL 200 MG PO TABS
400.0000 mg | ORAL_TABLET | Freq: Once | ORAL | Status: AC
  Administered 2012-09-10: 400 mg via ORAL
  Filled 2012-09-10: qty 2

## 2012-09-10 MED ORDER — AMIODARONE HCL 200 MG PO TABS
400.0000 mg | ORAL_TABLET | Freq: Two times a day (BID) | ORAL | Status: DC
  Filled 2012-09-10 (×2): qty 2

## 2012-09-10 NOTE — Progress Notes (Signed)
[facility] PROGRESS NOTE Assessment/Plan: Acute Diverticulitis with microperforation: - Abdominal pain improving.  - Tolerating clears. zosyn 12.18.2013 - change antibiotics to Cipro and flagyl 12.24.2013 to complete 2 weeks for home. - c. Dif negative 12.242.2013  Acute respiratory failure due to pulmonary edema (09/03/2012)/Cardiomyopathy (09/03/2012) - Will likely need cardiac evaluation when clinical status improves given degree of LV dysfunction. - Presumed 2/2 pulmonary edema in the setting of reduced EF 20 moderately dilated, with global hypokinesia, restrict fluids. - Extubated 12.19.2013 - good diuresis increase lasix to oral. CXR shows continued improvement in edema. KCL daily.  A Fib  - Likely exacerbated by acute illness.  - On IV amiodarone, agree with cards. ? Change to PO.  Hypernatremia (09/06/2012): - resolved. With IV lasix.  Hypokalemia (09/08/2012) - replete, will need schedule KCL for home. - check a mag. level.  Code Status: full Family Communication: spouse Disposition Plan: TBD   Consultants:  Cardiology  surgery  Procedures:  Echo 12.19.2013: EF 20 % global hypokanesia  Antibiotics:  zosyn (indicate start date, and stop date if known)  HPI/Subjective: Feels better tolerating diet.  Objective: Filed Vitals:   09/09/12 1025 09/09/12 1400 09/09/12 2202 09/10/12 0534  BP:  129/82 124/82 138/80  Pulse: 106 81 76 86  Temp:  97.8 F (36.6 C) 97.9 F (36.6 C) 97.8 F (36.6 C)  TempSrc:   Oral Oral  Resp:  17 16 16   Height:      Weight:      SpO2: 91% 94% 95% 94%    Intake/Output Summary (Last 24 hours) at 09/10/12 1225 Last data filed at 09/10/12 0900  Gross per 24 hour  Intake    927 ml  Output    500 ml  Net    427 ml   Filed Weights   09/04/12 0400 09/05/12 0404 09/06/12 0400  Weight: 71.1 kg (156 lb 12 oz) 69.6 kg (153 lb 7 oz) 66.1 kg (145 lb 11.6 oz)    Exam:  General: Alert, awake, oriented x3, in no acute  distress.  HEENT: No bruits, no goiter. +JVD Heart: Regular rate and rhythm, without murmurs, rubs, gallops.  Lungs: Good air movement, crackles at bases Abdomen: Soft, nontender, nondistended, positive bowel sounds.    Data Reviewed: Basic Metabolic Panel:  Lab 09/10/12 4098 09/09/12 0500 09/08/12 1150 09/08/12 0911 09/07/12 1000 09/07/12 0600 09/06/12 0420 09/03/12 2042  NA 142 140 -- 141 -- 145 151* --  K 3.4* 3.3* -- 2.7* -- 2.8* 3.6 --  CL 102 100 -- 98 -- 103 108 --  CO2 29 31 -- 31 -- 31 34* --  GLUCOSE 104* 107* -- 150* -- 108* 108* --  BUN 11 12 -- 12 -- 17 23 --  CREATININE 1.15 1.10 -- 1.04 -- 0.92 0.94 --  CALCIUM 8.4 8.3* -- 8.3* -- 8.2* 8.4 --  MG 1.9 -- 1.7 -- 1.8 -- 1.9 2.1  PHOS -- -- -- -- -- -- 2.5 --   Liver Function Tests:  Lab 09/06/12 0420 09/05/12 0400 09/04/12 0400 09/03/12 2042  AST 38* 29 21 19   ALT 18 13 13 13   ALKPHOS 55 50 59 55  BILITOT 0.4 0.3 0.3 0.3  PROT 6.0 5.7* 6.0 5.7*  ALBUMIN 2.2* 2.0* 2.1* 2.0*   No results found for this basename: LIPASE:5,AMYLASE:5 in the last 168 hours No results found for this basename: AMMONIA:5 in the last 168 hours CBC:  Lab 09/09/12 0500 09/08/12 0911 09/07/12 0600 09/06/12 0420 09/05/12 0400  WBC 14.3* 12.0* 12.9* 10.1 8.8  NEUTROABS -- 9.9* 10.7* 8.0* 7.5  HGB 14.6 14.8 15.0 14.5 14.2  HCT 42.8 43.4 44.8 43.8 42.9  MCV 94.9 94.8 95.7 97.6 97.7  PLT 298 325 337 338 322   Cardiac Enzymes:  Lab 09/04/12 0400 09/03/12 2118 09/03/12 1558  CKTOTAL -- -- --  CKMB -- -- --  CKMBINDEX -- -- --  TROPONINI <0.30 <0.30 <0.30   BNP (last 3 results)  Basename 09/03/12 1558  PROBNP 7946.0*   CBG:  Lab 09/08/12 1540 09/08/12 1233 09/08/12 0735 09/08/12 0427 09/08/12 0010  GLUCAP 142* 124* 107* 115* 120*    Recent Results (from the past 240 hour(s))  MRSA PCR SCREENING     Status: Normal   Collection Time   09/03/12  3:24 PM      Component Value Range Status Comment   MRSA by PCR NEGATIVE  NEGATIVE  Final   URINE CULTURE     Status: Normal   Collection Time   09/03/12  9:15 PM      Component Value Range Status Comment   Specimen Description URINE, CATHETERIZED   Final    Special Requests NONE   Final    Culture  Setup Time 09/03/2012 22:03   Final    Colony Count NO GROWTH   Final    Culture NO GROWTH   Final    Report Status 09/05/2012 FINAL   Final   CULTURE, RESPIRATORY     Status: Normal   Collection Time   09/04/12 12:51 PM      Component Value Range Status Comment   Specimen Description ENDOTRACHEAL   Final    Special Requests Normal   Final    Gram Stain     Final    Value: FEW WBC PRESENT, PREDOMINANTLY MONONUCLEAR     NO SQUAMOUS EPITHELIAL CELLS SEEN     NO ORGANISMS SEEN   Culture FEW CANDIDA ALBICANS   Final    Report Status 09/06/2012 FINAL   Final   CULTURE, BLOOD (ROUTINE X 2)     Status: Normal (Preliminary result)   Collection Time   09/04/12  3:50 PM      Component Value Range Status Comment   Specimen Description BLOOD LEFT ARM   Final    Special Requests BOTTLES DRAWN AEROBIC AND ANAEROBIC 10CC   Final    Culture  Setup Time 09/05/2012 00:12   Final    Culture     Final    Value:        BLOOD CULTURE RECEIVED NO GROWTH TO DATE CULTURE WILL BE HELD FOR 5 DAYS BEFORE ISSUING A FINAL NEGATIVE REPORT   Report Status PENDING   Incomplete   CULTURE, BLOOD (ROUTINE X 2)     Status: Normal   Collection Time   09/04/12  4:00 PM      Component Value Range Status Comment   Specimen Description BLOOD LEFT ARM   Final    Special Requests BOTTLES DRAWN AEROBIC AND ANAEROBIC 10CC   Final    Culture  Setup Time 09/05/2012 00:10   Final    Culture     Final    Value: STAPHYLOCOCCUS SPECIES (COAGULASE NEGATIVE)     Note: THE SIGNIFICANCE OF ISOLATING THIS ORGANISM FROM A SINGLE SET OF BLOOD CULTURES WHEN MULTIPLE SETS ARE DRAWN IS UNCERTAIN. PLEASE NOTIFY THE MICROBIOLOGY DEPARTMENT WITHIN ONE WEEK IF SPECIATION AND SENSITIVITIES ARE REQUIRED.     Note: Gram Stain  Report  Called to,Read Back By and Verified With: ALEX [facility] ON 09/05/12 AT 11:06P BY WILEJ   Report Status 09/07/2012 FINAL   Final   CLOSTRIDIUM DIFFICILE BY PCR     Status: Normal   Collection Time   09/08/12 10:35 PM      Component Value Range Status Comment   C difficile by pcr NEGATIVE  NEGATIVE Final      Studies: No results found.  Scheduled Meds:    . antiseptic oral rinse  15 mL Mouth Rinse q12n4p  . chlorhexidine  15 mL Mouth Rinse BID  . furosemide  40 mg Intravenous Daily  . heparin subcutaneous  5,000 Units Subcutaneous Q8H  . magnesium oxide  400 mg Oral BID  . metoprolol  2.5 mg Intravenous Q6H  . pantoprazole (PROTONIX) IV  40 mg Intravenous QHS  . piperacillin-tazobactam (ZOSYN)  IV  3.375 g Intravenous Q8H   Continuous Infusions:    . amiodarone (NEXTERONE PREMIX) 360 mg/200 mL dextrose 0.5 mg/min (09/10/12 0027)  . dextrose 40 mL/hr at 09/10/12 0647     Marinda Elk  [facility] Pager [phone]. If 8PM-8AM, please contact night-coverage at [email or web address], password [facility] 09/10/2012, 12:25 PM  LOS: 7 days

## 2012-09-10 NOTE — Progress Notes (Signed)
Patient Name: Ronnie OROURKE Date of Encounter: 09/10/2012     SUBJECTIVE:  No chest pain or SOB, eating better, having BMs, tolerating food and took pills including potassium pills without difficulty.  OBJECTIVE Filed Vitals:   09/09/12 1025 09/09/12 1400 09/09/12 2202 09/10/12 0534  BP:  129/82 124/82 138/80  Pulse: 106 81 76 86  Temp:  97.8 F (36.6 C) 97.9 F (36.6 C) 97.8 F (36.6 C)  TempSrc:   Oral Oral  Resp:  17 16 16   Height:      Weight:      SpO2: 91% 94% 95% 94%    Intake/Output Summary (Last 24 hours) at 09/10/12 1330 Last data filed at 09/10/12 0900  Gross per 24 hour  Intake    927 ml  Output    400 ml  Net    527 ml   Filed Weights   09/04/12 0400 09/05/12 0404 09/06/12 0400  Weight: 156 lb 12 oz (71.1 kg) 153 lb 7 oz (69.6 kg) 145 lb 11.6 oz (66.1 kg)   PHYSICAL EXAM General: Well developed, well nourished, male in no acute distress. Head: Normocephalic, atraumatic.  Neck: Supple without bruits, JVD not elevated. Lungs:  Resp regular and unlabored, basilar rales. Heart: RRR, S1, S2, no S3, S4, or murmur. Abdomen: Soft, residual tenderness, distended, BS present  Extremities: No clubbing, cyanosis, no edema.  Neuro: Alert and oriented X 3. Moves all extremities spontaneously. Psych: Normal affect.  LABS: CBC: Basename 09/09/12 0500 09/08/12 0911  WBC 14.3* 12.0*  NEUTROABS -- 9.9*  HGB 14.6 14.8  HCT 42.8 43.4  MCV 94.9 94.8  PLT 298 325   INR:No results found for this basename: INR in the last 72 hours Basic Metabolic Panel: Basename 09/10/12 0545 09/09/12 0500 09/08/12 1150  NA 142 140 --  K 3.4* 3.3* --  CL 102 100 --  CO2 29 31 --  GLUCOSE 104* 107* --  BUN 11 12 --  CREATININE 1.15 1.10 --  CALCIUM 8.4 8.3* --  MG 1.9 -- 1.7  PHOS -- -- --   BNP: Pro B Natriuretic peptide (BNP)  Date/Time Value Range Status  09/03/2012  3:58 PM 7946.0* 0 - 125 pg/mL Final    TELE:   I have reviewed telemetry today September 10, 2012. There is sinus rhythm.  Radiology/Studies:Dg Chest Port 1 View 09/08/2012  **ADDENDUM** CREATED: 09/08/2012 08:59:42  The tip of the indwelling right subclavian central venous catheter projects over the right innominate vein.  **END ADDENDUM** SIGNED BY: Arnell Sieving, M.D.   09/08/2012  *RADIOLOGY REPORT*  Clinical Data: Evaluate pulmonary edema  PORTABLE CHEST - 1 VIEW  Comparison: 09/06/2012; 09/05/2012; 09/04/2012  Findings:  Grossly unchanged enlarged cardiac silhouette and mediastinal contours with atherosclerotic calcifications within a mildly tortuous thoracic aorta.  Interval removal of enteric tube. Otherwise, stable positioning of support apparatus.  Improved aeration of the lungs with persistent bibasilar opacities, left greater than right. Cephalization of flow without frank evidence of edema.  No definite pleural effusion or pneumothorax.  Unchanged bones.  IMPRESSION: 1.  Improved aeration of the lungs with persistent bibasilar opacity favored to represent atelectasis.  2.  Pulmonary venous congestion without frank evidence of edema.  Original Report Authenticated By: Tacey Ruiz, MD    Current Medications:     . antiseptic oral rinse  15 mL Mouth Rinse q12n4p  . chlorhexidine  15 mL Mouth Rinse BID  . ciprofloxacin  500 mg Oral BID  .  furosemide  40 mg Oral Daily  . heparin subcutaneous  5,000 Units Subcutaneous Q8H  . magnesium oxide  400 mg Oral BID  . metoprolol  2.5 mg Intravenous Q6H  . metroNIDAZOLE  500 mg Oral Q8H  . pantoprazole (PROTONIX) IV  40 mg Intravenous QHS      . amiodarone (NEXTERONE PREMIX) 360 mg/200 mL dextrose 0.5 mg/min (09/10/12 0027)  . dextrose 40 mL/hr at 09/10/12 4540    ASSESSMENT AND PLAN:  Paroxysmal AF - pt maintaining SR on amio. PO intake is improving. Will change to oral amio at 400mg  PO daily, continue to follow.     Cardiomyopathy/ Acute systolic CHF (congestive heart failure) - on Lasix, need to continue to track weights  and will recheck BMET in am. Add daily Kdur 10 meq.    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 He was getting very small doses of IV beta blocker. I will start low-dose carvedilol.  In addition low-dose lisinopril will be started.  Bowel microperforation   Hypokalemia  Signed, Theodore Demark , PA-C 1:30 PM 09/10/2012 Patient seen and examined. I agree with the assessment and plan as detailed above. See also my additional thoughts below.   The patient is seen and examined. In addition I have reviewed all the information. I've discussed the findings with Theodore Demark PA-C. The IV amiodarone can be stopped. I decided that the oral dose will be 400 mg for another 2 weeks. This can then be changed to 200 mg daily.  Willa Rough, MD, [facility] 09/10/2012 2:25 PM

## 2012-09-11 DIAGNOSIS — J9600 Acute respiratory failure, unspecified whether with hypoxia or hypercapnia: Secondary | ICD-10-CM

## 2012-09-11 DIAGNOSIS — K5732 Diverticulitis of large intestine without perforation or abscess without bleeding: Secondary | ICD-10-CM

## 2012-09-11 LAB — BASIC METABOLIC PANEL
BUN: 15 mg/dL (ref 6–23)
CO2: 26 mEq/L (ref 19–32)
Calcium: 8.5 mg/dL (ref 8.4–10.5)
Creatinine, Ser: 1.17 mg/dL (ref 0.50–1.35)
GFR calc non Af Amer: 63 mL/min — ABNORMAL LOW (ref >90)
Glucose, Bld: 95 mg/dL (ref 70–99)
Sodium: 143 mEq/L (ref 135–145)

## 2012-09-11 LAB — CULTURE, BLOOD (ROUTINE X 2): Culture: NO GROWTH

## 2012-09-11 MED ORDER — PANTOPRAZOLE SODIUM 40 MG PO TBEC: 40.0000 mg | DELAYED_RELEASE_TABLET | Freq: Every day | ORAL | Status: DC

## 2012-09-11 MED ORDER — AMIODARONE HCL 200 MG PO TABS: 400.0000 mg | ORAL_TABLET | Freq: Two times a day (BID) | ORAL | Status: DC

## 2012-09-11 MED ORDER — CARVEDILOL 3.125 MG PO TABS: 3.1250 mg | ORAL_TABLET | Freq: Two times a day (BID) | ORAL | Status: DC

## 2012-09-11 MED ORDER — METRONIDAZOLE 500 MG PO TABS: 500.0000 mg | ORAL_TABLET | Freq: Three times a day (TID) | ORAL | Status: DC

## 2012-09-11 MED ORDER — AMIODARONE HCL 200 MG PO TABS: 400.0000 mg | ORAL_TABLET | Freq: Every day | ORAL | Status: DC

## 2012-09-11 MED ORDER — CIPROFLOXACIN HCL 500 MG PO TABS: 500.0000 mg | ORAL_TABLET | Freq: Two times a day (BID) | ORAL | Status: DC

## 2012-09-11 MED ORDER — LISINOPRIL 2.5 MG PO TABS: 5.0000 mg | ORAL_TABLET | Freq: Every day | ORAL | Status: DC

## 2012-09-11 MED ORDER — AMIODARONE HCL 200 MG PO TABS
400.0000 mg | ORAL_TABLET | Freq: Once | ORAL | Status: AC
  Administered 2012-09-11: 400 mg via ORAL
  Filled 2012-09-11: qty 2

## 2012-09-11 MED ORDER — FUROSEMIDE 40 MG PO TABS: 40.0000 mg | ORAL_TABLET | Freq: Every day | ORAL | Status: DC

## 2012-09-11 MED ORDER — POTASSIUM CHLORIDE ER 10 MEQ PO TBCR: 20.0000 meq | EXTENDED_RELEASE_TABLET | Freq: Two times a day (BID) | ORAL | Status: DC

## 2012-09-11 NOTE — Discharge Summary (Addendum)
Physician Discharge Summary  Ronnie Harvey:[id number] DOB: 31-Jan-1946 DOA: 09/03/2012  PCP: No primary provider on file.  Admit date: 09/03/2012 Discharge date: 09/11/2012  Time spent: 50 minutes  Recommendations for Outpatient Follow-up:  1. Follow up with cardiolofy as an outpatient for further work up (include homehealth, outpatient follow-up instructions, specific recommendations for PCP to follow-up on, etc.)  Discharge Diagnoses:  Principal Problem:  *Diverticulitis Active Problems:  Acute respiratory failure due to pulmonary edema  Paroxysmal AF  Cardiomyopathy  Smoker  Bowel microperforation  Positive blood culture  Hypernatremia  Acute systolic CHF (congestive heart failure)  Hypokalemia   Discharge Condition: stable  Diet recommendation: heart healthy  Filed Weights   09/04/12 0400 09/05/12 0404 09/06/12 0400  Weight: 71.1 kg (156 lb 12 oz) 69.6 kg (153 lb 7 oz) 66.1 kg (145 lb 11.6 oz)    History of present illness:  53 M with no PMH adm to [facility] on 12/14 with abd pain. CT abd > diverticulitis with microperforation. Course complicated by AFRVR, decompensation of respiratory status requiring intubation 12/17. Tx to ICU at [facility] for further care.   Hospital Course:  Acute Diverticulitis with microperforation: - Surgery consulted recommended medical management. Started empirically on . zosyn 12.18.2013  - change antibiotics to Cipro and flagyl 12.24.2013 to complete 2 weeks for home.  - started on diet patient tolerate this well. - develop some mild diarrhea c. Dif negative [id number].  Acute respiratory failure due to pulmonary edema (09/03/2012)/Cardiomyopathy (09/03/2012)  - Will likely need cardiac evaluation when clinical status improves given degree of LV dysfunction.  - Presumed 2/2 pulmonary edema in the setting of reduced EF 20 moderately dilated, with global hypokinesia,  - intubated on 12.17.2013, Extubated 12.19.2013  - good  diuresis with IV  lasix . CXR shows continued improvement in edema. KCL daily.   Paroxysmal A Fib  - Likely exacerbated by acute illness.  - On IV amiodarone, once able to tolerate PO change to amiodarone. D/c on amio to follow up with cards.  Hypernatremia (09/06/2012): - resolved. With IV lasix.  - most likely 2/2 to fluid overload.  Hypokalemia (09/08/2012) - replete, will need schedule KCL for home.  -  Procedures: Echo 12.19.2013: EF 20 % global hypokanesia   (i.e. Studies not automatically included, echos, thoracentesis, etc; not x-rays)  Consultations:  Cards   General surgery  Discharge Exam: Filed Vitals:   09/11/12 0327 09/11/12 0500 09/11/12 0740 09/11/12 0952  BP: 126/78 126/78 142/90 126/79  Pulse: 76 76 84 75  Temp: 97 F (36.1 C) 97 F (36.1 C)    TempSrc:      Resp: 20 20    Height:      Weight:      SpO2: 93% 93%      General: A&O x3  Cardiovascular: RRR Respiratory: good air movement CTA B/L  Discharge Instructions      Discharge Orders    Future Orders Please Complete By Expires   Diet - low sodium heart healthy      Increase activity slowly          Medication List     As of 09/11/2012 10:41 AM    TAKE these medications         amiodarone 200 MG tablet   Commonly known as: PACERONE   Take 2 tablets (400 mg total) by mouth daily.      carvedilol 3.125 MG tablet   Commonly known as: COREG   Take  1 tablet (3.125 mg total) by mouth 2 (two) times daily with a meal.      ciprofloxacin 500 MG tablet   Commonly known as: CIPRO   Take 1 tablet (500 mg total) by mouth 2 (two) times daily.      FISH OIL PO   Take 2 capsules by mouth daily.      furosemide 40 MG tablet   Commonly known as: LASIX   Take 1 tablet (40 mg total) by mouth daily.      lisinopril 2.5 MG tablet   Commonly known as: PRINIVIL,ZESTRIL   Take 2 tablets (5 mg total) by mouth daily.      metroNIDAZOLE 500 MG tablet   Commonly known as: FLAGYL    Take 1 tablet (500 mg total) by mouth every 8 (eight) hours.      multivitamin with minerals Tabs   Take 1 tablet by mouth daily.      potassium chloride 10 MEQ tablet   Commonly known as: K-DUR   Take 2 tablets (20 mEq total) by mouth 2 (two) times daily.            The results of significant diagnostics from this hospitalization (including imaging, microbiology, ancillary and laboratory) are listed below for reference.    Significant Diagnostic Studies: Ct Abdomen Pelvis W Contrast  09/06/2012  *RADIOLOGY REPORT*  Clinical Data: Abdominal distension with nausea.  Diverticulitis with micro perforation.  Fever respiratory distress.  Small bowel obstruction.  Alcohol and tobacco abuse.  CT ABDOMEN AND PELVIS WITH CONTRAST  Technique:  Multidetector CT imaging of the abdomen and pelvis was performed following the standard protocol during bolus administration of intravenous contrast.  Contrast: OMNIPAQUE IOHEXOL 300 MG/ML  SOLN  Comparison: 08/31/2012.  Findings: Small pleural effusions and basilar atelectasis.  Nasogastric tube in place tip in region of the gastric antrum.  Previously noted free intraperitoneal air has cleared.  Thickened abnormal appearing sigmoid colon may represent changes of diverticulosis/diverticulitis.  Underlying mass not excluded.  Increased amount of fluid throughout the abdomen pelvis.  Although some of this may be caused by ascites related to cirrhosis, result of peritonitis not excluded given the findings of prior bowel perforation.  Fluid collections within the lower abdomen and pelvis demonstrate mild peripheral enhancement.  Although no well defined abscess currently, the patient may be at risk for developing such.  Small bowel loops with slightly thickened folds and with dilated segment superior to the transverse colon may reflect changes of inflammation/reactive changes  No bowel containing hernia identified.  Fat containing right inguinal hernia.  Slightly  lobulated appearance of liver may indicate cirrhosis with surrounding varices.  Hypodensity of the liver adjacent to the falciform ligament spanning over 2.8 cm.  This is new or more conspicuous when compared to the prior examination.  Given the surrounding findings, early abscess not excluded and close attention to this recommended.  Ascites surrounding the spleen with indentation and superior margin.  Right adrenal gland 1.4 cm nodule cannot be confirmed as an adenoma and is without change.  Hyperplasia remainder of the adrenal glands.  Left renal 6.2 cm cyst.  Other renal low density lesions more notable on the right may be cysts although some too small to characterize but without change.  Gallbladder is full without calcified gallstone.  No focal pancreatic lesion.  Duodenal diverticulum at pancreatic head level is noted.  Coronary artery calcifications.  Prominent irregular plaque throughout the abdominal aorta with ectasia measuring up to 2.8  cm. Plaque iliac arteries with narrowing.  Plaque superior mesenteric artery with narrowing.  Poor delineation inferior mesenteric artery.  Third spacing of fluid.  This is noted in the subcutaneous region as well as involving the fascial planes bilaterally.  Scoliosis and degenerative changes most notable degenerative changes lower lumbar region.  Bilateral hip joint degenerative changes.  IMPRESSION: Increase in amount of abdominal and pelvic fluid which demonstrates peripheral enhancement suggesting peritonitis although some of fluid may be related to patient's liver disease.  Presently no well- defined drainable abscess although the patient may be at risk for development of such.  Change in appearance of the anterior aspect of the medial segment left lobe liver where hypodensity spanning over 2.8 cm is noted or more conspicuous than on the prior exam.  Given the surrounding findings, small liver abscess not entirely excluded.  Close attention to this on follow-up.   Thickened appearance of the sigmoid colon may indicate changes of diverticulosis/diverticulitis.  Underlying mass not excluded.  Regions of small bowel have slightly thickened folds and other areas appears slightly dilated which may indicate inflammatory/reactive changes.  Prominent atherosclerotic type changes as detailed above.  1.4 cm right adrenal lesion unchanged.  This cannot be confirmed as an adenoma on the present exam.  Renal cystic structures.  Some of the low density structures cannot be confirmed as simple cysts on the present examination but without change.  This has been made a PRA call report utilizing dashboard call feature.   Original Report Authenticated By: Lacy Duverney, M.D.    Dg Chest Port 1 View  09/08/2012  **ADDENDUM** CREATED: 09/08/2012 08:59:42  The tip of the indwelling right subclavian central venous catheter projects over the right innominate vein.  **END ADDENDUM** SIGNED BY: Arnell Sieving, M.D.   09/08/2012  *RADIOLOGY REPORT*  Clinical Data: Evaluate pulmonary edema  PORTABLE CHEST - 1 VIEW  Comparison: 09/06/2012; 09/05/2012; 09/04/2012  Findings:  Grossly unchanged enlarged cardiac silhouette and mediastinal contours with atherosclerotic calcifications within a mildly tortuous thoracic aorta.  Interval removal of enteric tube. Otherwise, stable positioning of support apparatus.  Improved aeration of the lungs with persistent bibasilar opacities, left greater than right. Cephalization of flow without frank evidence of edema.  No definite pleural effusion or pneumothorax.  Unchanged bones.  IMPRESSION: 1.  Improved aeration of the lungs with persistent bibasilar opacity favored to represent atelectasis.  2.  Pulmonary venous congestion without frank evidence of edema.  Original Report Authenticated By: Tacey Ruiz, MD    Dg Chest Port 1 View  09/06/2012  *RADIOLOGY REPORT*  Clinical Data: Edema  PORTABLE CHEST - 1 VIEW  Comparison: Yesterday  Findings: Endotracheal  tube removed.  NG tube stable.  Right subclavian venous catheters stable.  Upper normal heart size. Bibasilar airspace disease improved.  Vascular congestion improved. No pneumothorax.  IMPRESSION: Extubated.  Improved bilateral airspace disease and vascular congestion.   Original Report Authenticated By: Jolaine Click, M.D.    Dg Chest Port 1 View  09/05/2012  *RADIOLOGY REPORT*  Clinical Data: Shortness of breath.  PORTABLE CHEST - 1 VIEW  Comparison: 09/04/2012.  Findings: The endotracheal tube is 2.3 cm above the carina.  The NG tube and right subclavian catheters are stable.  The heart is enlarged but unchanged.  Persistent interstitial and airspace process in the lungs along with bilateral pleural effusions.  IMPRESSION:  1.  Stable support apparatus. 2.  Persistent interstitial and airspace process in the lungs and the small bilateral effusions.  Original Report Authenticated By: Rudie Meyer, M.D.    Portable Chest Xray In Am  09/04/2012  *RADIOLOGY REPORT*  Clinical Data: Evaluate pulmonary edema, endotracheal tube  PORTABLE CHEST - 1 VIEW  Comparison: 09/03/2012 (multiple examinations); 09/02/2012; 08/30/2012  Findings: Grossly unchanged enlarged cardiac silhouette and mediastinal contours with tortuosity within the thoracic aorta. Atherosclerotic calcifications in the aortic arch.  The pulmonary vasculature remains indistinct.  Grossly unchanged perihilar heterogeneous air space opacities, right greater than left.  No definite pleural effusion or pneumothorax.  Unchanged bones.  IMPRESSION: 1.  Stable positioning of support apparatus.  No pneumothorax. 2.  Grossly unchanged perihilar air space opacities which given rapidity of onset is favored to represent pulmonary edema, though note, underlying infection and/or aspiration is not excluded.   Original Report Authenticated By: Tacey Ruiz, MD    Portable Chest Xray To Verify Ett/ Central Line Placement  09/03/2012  *RADIOLOGY REPORT*  Clinical  Data: ET tube placement.  PORTABLE CHEST - 1 VIEW  Comparison: Chest 09/03/2012 at.  Findings: Endotracheal tube is in place with tip just below the clavicular heads in good position.  NG tube courses into the stomach.  Bilateral airspace disease has improved.  No pneumothorax identified.  There appear be small bilateral pleural effusions.  IMPRESSION:  1.  ET tube in good position. 2.  Improved bilateral airspace disease.   Original Report Authenticated By: Holley Dexter, M.D.     Microbiology: Recent Results (from the past 240 hour(s))  MRSA PCR SCREENING     Status: Normal   Collection Time   09/03/12  3:24 PM      Component Value Range Status Comment   MRSA by PCR NEGATIVE  NEGATIVE Final   URINE CULTURE     Status: Normal   Collection Time   09/03/12  9:15 PM      Component Value Range Status Comment   Specimen Description URINE, CATHETERIZED   Final    Special Requests NONE   Final    Culture  Setup Time 09/03/2012 22:03   Final    Colony Count NO GROWTH   Final    Culture NO GROWTH   Final    Report Status 09/05/2012 FINAL   Final   CULTURE, RESPIRATORY     Status: Normal   Collection Time   09/04/12 12:51 PM      Component Value Range Status Comment   Specimen Description ENDOTRACHEAL   Final    Special Requests Normal   Final    Gram Stain     Final    Value: FEW WBC PRESENT, PREDOMINANTLY MONONUCLEAR     NO SQUAMOUS EPITHELIAL CELLS SEEN     NO ORGANISMS SEEN   Culture FEW CANDIDA ALBICANS   Final    Report Status 09/06/2012 FINAL   Final   CULTURE, BLOOD (ROUTINE X 2)     Status: Normal   Collection Time   09/04/12  3:50 PM      Component Value Range Status Comment   Specimen Description BLOOD LEFT ARM   Final    Special Requests BOTTLES DRAWN AEROBIC AND ANAEROBIC 10CC   Final    Culture  Setup Time 09/05/2012 00:12   Final    Culture NO GROWTH 5 DAYS   Final    Report Status 09/11/2012 FINAL   Final   CULTURE, BLOOD (ROUTINE X 2)     Status: Normal    Collection Time   09/04/12  4:00 PM  Component Value Range Status Comment   Specimen Description BLOOD LEFT ARM   Final    Special Requests BOTTLES DRAWN AEROBIC AND ANAEROBIC 10CC   Final    Culture  Setup Time 09/05/2012 00:10   Final    Culture     Final    Value: STAPHYLOCOCCUS SPECIES (COAGULASE NEGATIVE)     Note: THE SIGNIFICANCE OF ISOLATING THIS ORGANISM FROM A SINGLE SET OF BLOOD CULTURES WHEN MULTIPLE SETS ARE DRAWN IS UNCERTAIN. PLEASE NOTIFY THE MICROBIOLOGY DEPARTMENT WITHIN ONE WEEK IF SPECIATION AND SENSITIVITIES ARE REQUIRED.     Note: Gram Stain Report Called to,Read Back By and Verified With: ALEX [facility] ON 09/05/12 AT 11:06P BY WILEJ   Report Status 09/07/2012 FINAL   Final   CLOSTRIDIUM DIFFICILE BY PCR     Status: Normal   Collection Time   09/08/12 10:35 PM      Component Value Range Status Comment   C difficile by pcr NEGATIVE  NEGATIVE Final      Labs: Basic Metabolic Panel:  Lab 09/11/12 1610 09/10/12 0545 09/09/12 0500 09/08/12 1150 09/08/12 0911 09/07/12 1000 09/07/12 0600 09/06/12 0420  NA 143 142 140 -- 141 -- 145 --  K 3.9 3.4* 3.3* -- 2.7* -- 2.8* --  CL 106 102 100 -- 98 -- 103 --  CO2 26 29 31  -- 31 -- 31 --  GLUCOSE 95 104* 107* -- 150* -- 108* --  BUN 15 11 12  -- 12 -- 17 --  CREATININE 1.17 1.15 1.10 -- 1.04 -- 0.92 --  CALCIUM 8.5 8.4 8.3* -- 8.3* -- 8.2* --  MG -- 1.9 -- 1.7 -- 1.8 -- 1.9  PHOS -- -- -- -- -- -- -- 2.5   Liver Function Tests:  Lab 09/06/12 0420 09/05/12 0400  AST 38* 29  ALT 18 13  ALKPHOS 55 50  BILITOT 0.4 0.3  PROT 6.0 5.7*  ALBUMIN 2.2* 2.0*   No results found for this basename: LIPASE:5,AMYLASE:5 in the last 168 hours No results found for this basename: AMMONIA:5 in the last 168 hours CBC:  Lab 09/09/12 0500 09/08/12 0911 09/07/12 0600 09/06/12 0420 09/05/12 0400  WBC 14.3* 12.0* 12.9* 10.1 8.8  NEUTROABS -- 9.9* 10.7* 8.0* 7.5  HGB 14.6 14.8 15.0 14.5 14.2  HCT 42.8 43.4 44.8 43.8 42.9  MCV 94.9  94.8 95.7 97.6 97.7  PLT 298 325 337 338 322   Cardiac Enzymes: No results found for this basename: CKTOTAL:5,CKMB:5,CKMBINDEX:5,TROPONINI:5 in the last 168 hours BNP: BNP (last 3 results)  Basename 09/03/12 1558  PROBNP 7946.0*   CBG:  Lab 09/08/12 1540 09/08/12 1233 09/08/12 0735 09/08/12 0427 09/08/12 0010  GLUCAP 142* 124* 107* 115* 120*       Signed:  FELIZ ORTIZ, Chessica Audia  [facility] 09/11/2012, 10:41 AM

## 2012-09-11 NOTE — Progress Notes (Signed)
Pt was getting out of bed and noticed that he was bleeding from where his central line was removed. Pressure was applied and a new pressure dressing was applied. VS stable. Pt denied any dizziness. Np on call made aware. IV team made aware. Will cont to monitor pt.

## 2012-09-11 NOTE — Progress Notes (Signed)
DC orders received.  Patient stable with no S/S of distress.  Medication and discharge information reviewed with patient and patient's family.  Patient encouraged to obtain a PCP for hospital follow-up. Patient DC home with family. Nolon Nations

## 2012-09-11 NOTE — Progress Notes (Signed)
Patient ID: Ronnie Harvey, male   DOB: May 30, 1946, 66 y.o.   MRN: [id number]   SUBJECTIVE:  Patient looks quite good today. His volume status is stable. He did have for wide complex beats at a rate of 140 on his monitor. Otherwise he is maintaining sinus rhythm.   Filed Vitals:   09/10/12 2322 09/11/12 0327 09/11/12 0500 09/11/12 0740  BP: 123/79 126/78 126/78 142/90  Pulse: 82 76 76 84  Temp: 97.7 F (36.5 C) 97 F (36.1 C) 97 F (36.1 C)   TempSrc:      Resp: 20 20 20    Height:      Weight:      SpO2: 93% 93% 93%     Intake/Output Summary (Last 24 hours) at 09/11/12 0757 Last data filed at 09/11/12 0300  Gross per 24 hour  Intake    760 ml  Output    950 ml  Net   -190 ml    LABS: Basic Metabolic Panel:  Basename 09/11/12 0500 09/10/12 0545 09/08/12 1150  NA 143 142 --  K 3.9 3.4* --  CL 106 102 --  CO2 26 29 --  GLUCOSE 95 104* --  BUN 15 11 --  CREATININE 1.17 1.15 --  CALCIUM 8.5 8.4 --  MG -- 1.9 1.7  PHOS -- -- --   Liver Function Tests: No results found for this basename: AST:2,ALT:2,ALKPHOS:2,BILITOT:2,PROT:2,ALBUMIN:2 in the last 72 hours No results found for this basename: LIPASE:2,AMYLASE:2 in the last 72 hours CBC:  Basename 09/09/12 0500 09/08/12 0911  WBC 14.3* 12.0*  NEUTROABS -- 9.9*  HGB 14.6 14.8  HCT 42.8 43.4  MCV 94.9 94.8  PLT 298 325   Cardiac Enzymes: No results found for this basename: CKTOTAL:3,CKMB:3,CKMBINDEX:3,TROPONINI:3 in the last 72 hours BNP: No components found with this basename: POCBNP:3 D-Dimer: No results found for this basename: DDIMER:2 in the last 72 hours Hemoglobin A1C: No results found for this basename: HGBA1C in the last 72 hours Fasting Lipid Panel: No results found for this basename: CHOL,HDL,LDLCALC,TRIG,CHOLHDL,LDLDIRECT in the last 72 hours Thyroid Function Tests: No results found for this basename: TSH,T4TOTAL,FREET3,T3FREE,THYROIDAB in the last 72 hours  RADIOLOGY: Ct Abdomen Pelvis W  Contrast  09/06/2012  *RADIOLOGY REPORT*  Clinical Data: Abdominal distension with nausea.  Diverticulitis with micro perforation.  Fever respiratory distress.  Small bowel obstruction.  Alcohol and tobacco abuse.  CT ABDOMEN AND PELVIS WITH CONTRAST  Technique:  Multidetector CT imaging of the abdomen and pelvis was performed following the standard protocol during bolus administration of intravenous contrast.  Contrast: OMNIPAQUE IOHEXOL 300 MG/ML  SOLN  Comparison: 08/31/2012.  Findings: Small pleural effusions and basilar atelectasis.  Nasogastric tube in place tip in region of the gastric antrum.  Previously noted free intraperitoneal air has cleared.  Thickened abnormal appearing sigmoid colon may represent changes of diverticulosis/diverticulitis.  Underlying mass not excluded.  Increased amount of fluid throughout the abdomen pelvis.  Although some of this may be caused by ascites related to cirrhosis, result of peritonitis not excluded given the findings of prior bowel perforation.  Fluid collections within the lower abdomen and pelvis demonstrate mild peripheral enhancement.  Although no well defined abscess currently, the patient may be at risk for developing such.  Small bowel loops with slightly thickened folds and with dilated segment superior to the transverse colon may reflect changes of inflammation/reactive changes  No bowel containing hernia identified.  Fat containing right inguinal hernia.  Slightly lobulated appearance of liver may indicate  cirrhosis with surrounding varices.  Hypodensity of the liver adjacent to the falciform ligament spanning over 2.8 cm.  This is new or more conspicuous when compared to the prior examination.  Given the surrounding findings, early abscess not excluded and close attention to this recommended.  Ascites surrounding the spleen with indentation and superior margin.  Right adrenal gland 1.4 cm nodule cannot be confirmed as an adenoma and is without change.   Hyperplasia remainder of the adrenal glands.  Left renal 6.2 cm cyst.  Other renal low density lesions more notable on the right may be cysts although some too small to characterize but without change.  Gallbladder is full without calcified gallstone.  No focal pancreatic lesion.  Duodenal diverticulum at pancreatic head level is noted.  Coronary artery calcifications.  Prominent irregular plaque throughout the abdominal aorta with ectasia measuring up to 2.8 cm. Plaque iliac arteries with narrowing.  Plaque superior mesenteric artery with narrowing.  Poor delineation inferior mesenteric artery.  Third spacing of fluid.  This is noted in the subcutaneous region as well as involving the fascial planes bilaterally.  Scoliosis and degenerative changes most notable degenerative changes lower lumbar region.  Bilateral hip joint degenerative changes.  IMPRESSION: Increase in amount of abdominal and pelvic fluid which demonstrates peripheral enhancement suggesting peritonitis although some of fluid may be related to patient's liver disease.  Presently no well- defined drainable abscess although the patient may be at risk for development of such.  Change in appearance of the anterior aspect of the medial segment left lobe liver where hypodensity spanning over 2.8 cm is noted or more conspicuous than on the prior exam.  Given the surrounding findings, small liver abscess not entirely excluded.  Close attention to this on follow-up.  Thickened appearance of the sigmoid colon may indicate changes of diverticulosis/diverticulitis.  Underlying mass not excluded.  Regions of small bowel have slightly thickened folds and other areas appears slightly dilated which may indicate inflammatory/reactive changes.  Prominent atherosclerotic type changes as detailed above.  1.4 cm right adrenal lesion unchanged.  This cannot be confirmed as an adenoma on the present exam.  Renal cystic structures.  Some of the low density structures cannot  be confirmed as simple cysts on the present examination but without change.  This has been made a PRA call report utilizing dashboard call feature.   Original Report Authenticated By: Lacy Duverney, M.D.    Dg Chest Port 1 View  09/08/2012  **ADDENDUM** CREATED: 09/08/2012 08:59:42  The tip of the indwelling right subclavian central venous catheter projects over the right innominate vein.  **END ADDENDUM** SIGNED BY: Arnell Sieving, M.D.   09/08/2012  *RADIOLOGY REPORT*  Clinical Data: Evaluate pulmonary edema  PORTABLE CHEST - 1 VIEW  Comparison: 09/06/2012; 09/05/2012; 09/04/2012  Findings:  Grossly unchanged enlarged cardiac silhouette and mediastinal contours with atherosclerotic calcifications within a mildly tortuous thoracic aorta.  Interval removal of enteric tube. Otherwise, stable positioning of support apparatus.  Improved aeration of the lungs with persistent bibasilar opacities, left greater than right. Cephalization of flow without frank evidence of edema.  No definite pleural effusion or pneumothorax.  Unchanged bones.  IMPRESSION: 1.  Improved aeration of the lungs with persistent bibasilar opacity favored to represent atelectasis.  2.  Pulmonary venous congestion without frank evidence of edema.  Original Report Authenticated By: Tacey Ruiz, MD    Dg Chest Port 1 View  09/06/2012  *RADIOLOGY REPORT*  Clinical Data: Edema  PORTABLE CHEST - 1 VIEW  Comparison: Yesterday  Findings: Endotracheal tube removed.  NG tube stable.  Right subclavian venous catheters stable.  Upper normal heart size. Bibasilar airspace disease improved.  Vascular congestion improved. No pneumothorax.  IMPRESSION: Extubated.  Improved bilateral airspace disease and vascular congestion.   Original Report Authenticated By: Jolaine Click, M.D.    Dg Chest Port 1 View  09/05/2012  *RADIOLOGY REPORT*  Clinical Data: Shortness of breath.  PORTABLE CHEST - 1 VIEW  Comparison: 09/04/2012.  Findings: The endotracheal  tube is 2.3 cm above the carina.  The NG tube and right subclavian catheters are stable.  The heart is enlarged but unchanged.  Persistent interstitial and airspace process in the lungs along with bilateral pleural effusions.  IMPRESSION:  1.  Stable support apparatus. 2.  Persistent interstitial and airspace process in the lungs and the small bilateral effusions.   Original Report Authenticated By: Rudie Meyer, M.D.    Portable Chest Xray In Am  09/04/2012  *RADIOLOGY REPORT*  Clinical Data: Evaluate pulmonary edema, endotracheal tube  PORTABLE CHEST - 1 VIEW  Comparison: 09/03/2012 (multiple examinations); 09/02/2012; 08/30/2012  Findings: Grossly unchanged enlarged cardiac silhouette and mediastinal contours with tortuosity within the thoracic aorta. Atherosclerotic calcifications in the aortic arch.  The pulmonary vasculature remains indistinct.  Grossly unchanged perihilar heterogeneous air space opacities, right greater than left.  No definite pleural effusion or pneumothorax.  Unchanged bones.  IMPRESSION: 1.  Stable positioning of support apparatus.  No pneumothorax. 2.  Grossly unchanged perihilar air space opacities which given rapidity of onset is favored to represent pulmonary edema, though note, underlying infection and/or aspiration is not excluded.   Original Report Authenticated By: Tacey Ruiz, MD    Portable Chest Xray To Verify Ett/ Central Line Placement  09/03/2012  *RADIOLOGY REPORT*  Clinical Data: ET tube placement.  PORTABLE CHEST - 1 VIEW  Comparison: Chest 09/03/2012 at.  Findings: Endotracheal tube is in place with tip just below the clavicular heads in good position.  NG tube courses into the stomach.  Bilateral airspace disease has improved.  No pneumothorax identified.  There appear be small bilateral pleural effusions.  IMPRESSION:  1.  ET tube in good position. 2.  Improved bilateral airspace disease.   Original Report Authenticated By: Holley Dexter, M.D.      PHYSICAL EXAM  Patient is oriented to person time and place. Affect is normal. Lungs are clear. Respiratory effort is nonlabored. Cardiac exam reveals S1 and S2. There no clicks or significant murmurs. The abdomen is soft. There is no peripheral edema.   TELEMETRY:  I have reviewed telemetry today September 11, 2012. There sinus rhythm. There is one episode of for wide complex beats with a rate of 140.   ASSESSMENT AND PLAN:   Paroxysmal AF    He is now holding sinus. He was switched to oral amiodarone yesterday. He can go home on 400 mg daily. This dose can be continued until he is seen in the cardiology office for decisions about his dosing.He is not yet anticoagulated. His atrial fib occurred in the setting of his acute GI abnormality. Careful consideration can be given to possible anticoagulation when he is seen in followup.   Cardiomyopathy     The patient's left ventricular dysfunction was first noted when he was admitted with microperforation of his bowel. He will leave further cardiac workup. This can be done as an outpatient. This workup will probably need to include cardiac catheterization. This can be decided when he seen as  an outpatient.   Bowel microperforation    Patient is improving greatly and will be ready to go home soon.     Acute systolic CHF (congestive heart failure)    His volume status is stabilized. He has been started on low-dose medications for his LV dysfunction. These can be titrated as an outpatient.   Hypokalemia   Potassium has now normalized.  The patient can be discharged home from the cardiac viewpoint when medicine and surgery feel he is stable. I will arrange his post hospital cardiac followup. It has not yet been determined which office he will be seen in. His primary physician is in [place].   Willa Rough 09/11/2012 7:57 AM

## 2012-10-09 ENCOUNTER — Encounter: Payer: Self-pay | Admitting: Cardiology

## 2012-10-09 ENCOUNTER — Ambulatory Visit (INDEPENDENT_AMBULATORY_CARE_PROVIDER_SITE_OTHER): Payer: BC Managed Care – PPO | Admitting: Cardiology

## 2012-10-09 VITALS — BP 120/70 | HR 58 | Ht 64.0 in | Wt 138.0 lb

## 2012-10-09 DIAGNOSIS — I429 Cardiomyopathy, unspecified: Secondary | ICD-10-CM

## 2012-10-09 DIAGNOSIS — I5021 Acute systolic (congestive) heart failure: Secondary | ICD-10-CM

## 2012-10-09 DIAGNOSIS — I428 Other cardiomyopathies: Secondary | ICD-10-CM

## 2012-10-09 DIAGNOSIS — I509 Heart failure, unspecified: Secondary | ICD-10-CM

## 2012-10-09 DIAGNOSIS — I2510 Atherosclerotic heart disease of native coronary artery without angina pectoris: Secondary | ICD-10-CM

## 2012-10-09 DIAGNOSIS — F17200 Nicotine dependence, unspecified, uncomplicated: Secondary | ICD-10-CM

## 2012-10-09 DIAGNOSIS — I4891 Unspecified atrial fibrillation: Secondary | ICD-10-CM

## 2012-10-09 MED ORDER — FUROSEMIDE 20 MG PO TABS: 20.0000 mg | ORAL_TABLET | Freq: Every day | ORAL | Status: DC

## 2012-10-09 MED ORDER — CARVEDILOL 6.25 MG PO TABS: 6.2500 mg | ORAL_TABLET | Freq: Two times a day (BID) | ORAL | Status: DC

## 2012-10-09 MED ORDER — AMIODARONE HCL 200 MG PO TABS: 200.0000 mg | ORAL_TABLET | Freq: Every day | ORAL | Status: DC

## 2012-10-09 NOTE — Progress Notes (Signed)
HPI The patient presents for followup after a hospitalization in December. He was seen in consultation by [place] area he apparently had diverticulitis and bowel microperforation. He had paroxysmally in as part of this hospitalization. He had respiratory failure and required intubation. His ejection fraction was 20%.  He was not anticoagulated at bedtime because of his ongoing acute issues. He also did not have a workup of his cardiomyopathy.  The patient presents for followup. He says he's had no prior cardiac history. He stopped drinking many years ago. He actually stopped smoking with his last hospitalization. He said he occasionally felt some palpitations but no presyncope or syncope. He doesn't recall any recently. He doesn't recall any shortness of breath and doesn't have PND or orthopnea. He does some work around his house and climbs 5 stairs into his house routinely.  He doesn't recall any problems with this. He does not report chest pressure, neck or arm discomfort. He's had no weight gain or edema.  .No Known Allergies  Current Outpatient Prescriptions  Medication Sig Dispense Refill  . amiodarone (PACERONE) 200 MG tablet Take 2 tablets (400 mg total) by mouth daily.  30 tablet  0  . carvedilol (COREG) 3.125 MG tablet Take 1 tablet (3.125 mg total) by mouth 2 (two) times daily with a meal.  60 tablet  0  . furosemide (LASIX) 40 MG tablet Take 1 tablet (40 mg total) by mouth daily.  30 tablet  0  . lisinopril (PRINIVIL,ZESTRIL) 2.5 MG tablet Take 2 tablets (5 mg total) by mouth daily.  30 tablet  0  . Multiple Vitamin (MULTIVITAMIN WITH MINERALS) TABS Take 1 tablet by mouth daily.      . Omega-3 Fatty Acids (FISH OIL PO) Take 2 capsules by mouth daily.      . potassium chloride (K-DUR) 10 MEQ tablet Take 2 tablets (20 mEq total) by mouth 2 (two) times daily.  30 tablet  0    Past Medical History  Diagnosis Date  . ETOH abuse     Quit 1998  . Tobacco abuse     Quit Dec 2012  .  Cardiomyopathy, dilated   . Atrial fibrillation     Past Surgical History  Procedure Date  . Neck cyst resection   . Vasectomy     ROS:  As stated in the HPI and negative for all other systems.  PHYSICAL EXAM BP 120/70  Pulse 58  Ht 5\' 4"  (1.626 m)  Wt 138 lb (62.596 kg)  BMI 23.69 kg/m2 GENERAL:  Well appearing HEENT:  Pupils equal round and reactive, fundi not visualized, oral mucosa unremarkable NECK:  No jugular venous distention, waveform within normal limits, carotid upstroke brisk and symmetric, no bruits, no thyromegaly LYMPHATICS:  No cervical, inguinal adenopathy LUNGS:  Clear to auscultation bilaterally BACK:  No CVA tenderness CHEST:  Unremarkable HEART:  PMI not displaced or sustained,S1 and S2 within normal limits, no S3, no S4, no clicks, no rubs, no murmurs ABD:  Flat, positive bowel sounds normal in frequency in pitch, no bruits, no rebound, no guarding, no midline pulsatile mass, no hepatomegaly, no splenomegaly EXT:  2 plus pulses upper, diminished bilateral lower, no edema, no cyanosis no clubbing SKIN:  No rashes no nodules NEURO:  Cranial nerves II through XII grossly intact, motor grossly intact throughout PSYCH:  Cognitively intact, oriented to person place and time   ASSESSMENT AND PLAN  Cardiomyopathy I'm going to repeat an echocardiogram first to make sure this was not  a cardiomyopathy related to his acute illness. It is still low he will need cardiac catheterization to exclude obstructive coronary disease. The patient understands that risks included but are not limited to stroke (1 in 1000), death (1 in 1000), kidney failure [usually temporary] (1 in 500), bleeding (1 in 200), allergic reaction [possibly serious] (1 in 200).  The patient understands and agrees to proceed if necessary. I will titrate his carvedilol to 6.25 mg twice a day. We discussed salt and fluid restriction. I will also reduce his Lasix to 20 mg daily. Gave him literature on heart  failure/cardiomyopathy.  Atrial fibrillation I likely will stop his amiodarone in the future but for now will reduce it to 200 mg daily. In the absence of any other evidence of atrial fibrillation I will likely avoid committing him to anticoagulation.

## 2012-10-09 NOTE — Patient Instructions (Addendum)
Please decrease Amiodarone to 200 mg a day. Decrease Furosemide to 20 mg a day. Increase Carvedilol to 6.25 mg twice a day. Continue all other medications as listed  Your physician has requested that you have an echocardiogram. Echocardiography is a painless test that uses sound waves to create images of your heart. It provides your doctor with information about the size and shape of your heart and how well your heart's chambers and valves are working. This procedure takes approximately one hour. There are no restrictions for this procedure.  Depending on the results of your echocardiogram you may need a cardiac cath as discussed with Dr Antoine Poche.    Heart Failure Heart failure (HF) is a condition in which the heart has trouble pumping blood. This means your heart does not pump blood efficiently for your body to work well. In some cases of HF, fluid may back up into your lungs or you may have swelling (edema) in your lower legs. HF is a long-term (chronic) condition. It is important for you to take good care of yourself and follow your caregiver's treatment plan. CAUSES   Health conditions:  High blood pressure (hypertension) causes the heart muscle to work harder than normal. When pressure in the blood vessels is high, the heart needs to pump (contract) with more force in order to circulate blood throughout the body. High blood pressure eventually causes the heart to become stiff and weak.  Coronary artery disease (CAD) is the buildup of cholesterol and fat (plaques) in the arteries of the heart. The blockage in the arteries deprives the heart muscle of oxygen and blood. This can cause chest pain and may lead to a heart attack. High blood pressure can also contribute to CAD.  Heart attack (myocardial infarction) occurs when 1 or more arteries in the heart become blocked. The loss of oxygen damages the muscle tissue of the heart. When this happens, part of the heart muscle dies. The injured tissue  does not contract as well and weakens the heart's ability to pump blood.  Abnormal heart valves can cause HF when the heart valves do not open and close properly. This makes the heart muscle pump harder to keep the blood flowing.  Heart muscle disease (cardiomyopathy or myocarditis) is damage to the heart muscle from a variety of causes. These can include drug or alcohol abuse, infections, or unknown reasons. These can increase the risk of HF.  Lung disease makes the heart work harder because the lungs do not work properly. This can cause a strain on the heart leading it to fail.  Diabetes increases the risk of HF. High blood sugar contributes to high fat (lipid) levels in the blood. Diabetes can also cause slow damage to tiny blood vessels that carry important nutrients to the heart muscle. When the heart does not get enough oxygen and food, it can cause the heart to become weak and stiff. This leads to a heart that does not contract efficiently.  Other diseases can contribute to HF. These include abnormal heart rhythms, thyroid problems, and low blood counts (anemia).  Unhealthy lifestyle habits:  Obesity.  Smoking.  Eating foods high in fat and cholesterol.  Eating or drinking beverages high in salt.  Drug or alcohol abuse.  Lack of exercise. SYMPTOMS  HF symptoms may vary and can be hard to detect. Symptoms may include:  Shortness of breath with activity, such as climbing stairs.  Persistent cough.  Swelling of the feet, ankles, legs, or abdomen.  Unexplained  weight gain.  Difficulty breathing when lying flat.  Waking from sleep because of the need to sit up and get more air.  Rapid heartbeat.  Fatigue and loss of energy.  Feeling lightheaded or close to fainting. DIAGNOSIS  A diagnosis of HF is based on your history, symptoms, physical examination, and diagnostic tests. Diagnostic tests for HF may include:  EKG.  Chest X-ray.  Blood tests.  Exercise stress  test.  Blood oxygen test (arterial blood gas).  Evaluation by a heart doctor (cardiologist).  Ultrasound evaluation of the heart (echocardiogram).  Heart artery test to look for blockages (angiogram).  Radioactive imaging to look at the heart (radionuclide test). TREATMENT  Treatment is aimed at managing the symptoms of HF. Medicines, lifestyle changes, or surgical intervention may be necessary to treat HF.  Medicines to help treat HF may include:  Angiotensin-converting enzyme (ACE) inhibitors. These block the effects of a blood protein called angiotensin-converting enzyme. ACE inhibitors relax (dilate) the blood vessels and help lower blood pressure. This decreases the workload of the heart, slows the progression of HF, and improves symptoms.  Angiotensin receptor blockers (ARBs). These medications work similar to ACE inhibitors. ARBs may be an alternative for people who cannot tolerate an ACE inhibitor.  Aldosterone antagonists. This medication helps get rid of extra fluid from your body. This lowers the volume of blood the heart has to pump.  Water pills (diuretics). Diuretics cause the kidneys to remove salt and water from the blood. The extra fluid is removed by urination. By removing extra fluid from the body, diuretics help lower the workload of the heart and help prevent fluid buildup in the lungs so breathing is easier.  Beta blockers. These prevent the heart from beating too fast and improve heart muscle strength. Beta blockers help maintain a normal heart rate, control blood pressure, and improve HF symptoms.  Digitalis. This increases the force of the heartbeat and may be helpful to people with HF or heart rhythm problems.  Healthy lifestyle changes include:  Stopping smoking.  Eating a healthy diet. Avoid foods high in fat. Avoid foods fried in oil or made with fat. A dietician can help with healthy food choices.  Limiting how much salt you eat.  Limiting alcohol  intake to no more than 1 drink per day for women and 2 drinks per day for men. Drinking more than that is harmful to your heart. If your heart has already been damaged by alcohol or you have severe HF, drinking alcohol should be stopped completely.  Exercising as directed by your caregiver.  Surgical treatment for HF may include:  Procedures to open blocked arteries, repair damaged heart valves, or remove damaged heart muscle tissue.  A pacemaker to help heart muscle function and to control certain abnormal heart rhythms.  A defibrillator to possibly prevent sudden cardiac death. HOME CARE INSTRUCTIONS   Activity level. Your caregiver can help you determine what type of exercise program may be helpful. It is important to maintain your strength. Pace your physical activity to avoid shortness of breath or chest pain. Rest for 1 hour before and after meals. A cardiac rehabilitation program may be helpful to some people with HF.  Diet. Eat a heart healthy diet. Food choices should be low in saturated fat and cholesterol. Talk to a dietician to learn about heart healthy foods.  Salt intake. When you have HF, you need to limit the amount of salt you eat. Eat less than 1500 milligrams (mg) of salt  per day or as recommended by your caregiver.  Weight monitoring. Weigh yourself every day. You should weigh yourself in the morning after you urinate and before you eat breakfast. Wear the same amount of clothing each time you weigh yourself. Record your weight daily. Bring your recorded weights to your clinic visits. Tell your caregiver right away if you have gained 3 lb/1.4 kg in 1 day, or 5 lb/2.3 kg in a week or whatever amount you were told to report.  Blood pressure monitoring. This should be done as directed by your caregiver. A home blood pressure cuff can be purchased at a drugstore. Record your blood pressure numbers and bring them to your clinic visits. Tell your caregiver if you become dizzy or  lightheaded upon standing up.  Smoking. If you are currently a smoker, it is time to quit. Nicotine makes your heart work harder by causing your blood vessels to constrict. Do not use nicotine gum or patches before talking to your caregiver.  Follow up. Be sure to schedule a follow-up visit with your caregiver. Keep all your appointments. SEEK MEDICAL CARE IF:   Your weight increases by 3 lb/1.4 kg in 1 day or 5 lb/2.3 kg in a week.  You notice increasing shortness of breath that is unusual for you. This may happen during rest, sleep, or with activity.  You cough more than normal, especially with physical activity.  You notice more swelling in your hands, feet, ankles, or belly (abdomen).  You are unable to sleep because it is hard to breathe.  You cough up bloody mucus (sputum).  You begin to feel "jumping" or "fluttering" sensations (palpitations) in your chest. SEEK IMMEDIATE MEDICAL CARE IF:   You have severe chest pain or pressure which may include symptoms such as:  Pain or pressure in the arms, neck, jaw, or back.  Feeling sweaty.  Feeling sick to your stomach (nauseous).  Feeling short of breath while at rest.  Having a fast or irregular heartbeat.  You experience stroke symptoms. These symptoms include:  Facial weakness or numbness.  Weakness or numbness in an arm, leg, or on one side of your body.  Blurred vision.  Difficulty talking or thinking.  Dizziness or fainting.  Severe headache. MAKE SURE YOU:   Understand these instructions.  Will watch your condition.  Will get help right away if you are not doing well or get worse. Document Released: 09/04/2005 Document Revised: 03/05/2012 Document Reviewed: 12/17/2009 [facility] Patient Information 2013 [place], [place].

## 2012-10-10 ENCOUNTER — Other Ambulatory Visit: Payer: Self-pay

## 2012-10-10 ENCOUNTER — Other Ambulatory Visit (INDEPENDENT_AMBULATORY_CARE_PROVIDER_SITE_OTHER): Payer: BC Managed Care – PPO

## 2012-10-10 DIAGNOSIS — I429 Cardiomyopathy, unspecified: Secondary | ICD-10-CM

## 2012-10-10 DIAGNOSIS — I509 Heart failure, unspecified: Secondary | ICD-10-CM

## 2012-10-10 DIAGNOSIS — I2510 Atherosclerotic heart disease of native coronary artery without angina pectoris: Secondary | ICD-10-CM

## 2012-10-10 DIAGNOSIS — I428 Other cardiomyopathies: Secondary | ICD-10-CM

## 2012-10-10 DIAGNOSIS — I4891 Unspecified atrial fibrillation: Secondary | ICD-10-CM

## 2012-10-17 ENCOUNTER — Telehealth: Payer: Self-pay | Admitting: *Deleted

## 2012-10-17 DIAGNOSIS — Z01810 Encounter for preprocedural cardiovascular examination: Secondary | ICD-10-CM

## 2012-10-17 DIAGNOSIS — I420 Dilated cardiomyopathy: Secondary | ICD-10-CM

## 2012-10-17 NOTE — Telephone Encounter (Signed)
Called patient and set up JV cath for 2/3 arrive at 830am for a 930 case with Dr.Hochrein. Patient aware NPO after midnight and to come in for pre work up labs on 1/31.

## 2012-10-18 ENCOUNTER — Other Ambulatory Visit (INDEPENDENT_AMBULATORY_CARE_PROVIDER_SITE_OTHER): Payer: MEDICARE

## 2012-10-18 ENCOUNTER — Encounter: Payer: Self-pay | Admitting: *Deleted

## 2012-10-18 DIAGNOSIS — Z01810 Encounter for preprocedural cardiovascular examination: Secondary | ICD-10-CM

## 2012-10-18 DIAGNOSIS — I420 Dilated cardiomyopathy: Secondary | ICD-10-CM

## 2012-10-18 DIAGNOSIS — I428 Other cardiomyopathies: Secondary | ICD-10-CM

## 2012-10-18 LAB — CBC WITH DIFFERENTIAL/PLATELET
Basophils Relative: 0.4 % (ref 0.0–3.0)
Eosinophils Relative: 0.1 % (ref 0.0–5.0)
Lymphocytes Relative: 15.5 % (ref 12.0–46.0)
MCV: 94.8 fl (ref 78.0–100.0)
Monocytes Relative: 6.3 % (ref 3.0–12.0)
Neutrophils Relative %: 77.7 % — ABNORMAL HIGH (ref 43.0–77.0)
RBC: 4.5 Mil/uL (ref 4.22–5.81)
WBC: 16.5 10*3/uL — ABNORMAL HIGH (ref 4.5–10.5)

## 2012-10-18 LAB — BASIC METABOLIC PANEL
Calcium: 9 mg/dL (ref 8.4–10.5)
Chloride: 103 mEq/L (ref 96–112)
Creatinine, Ser: 1.1 mg/dL (ref 0.4–1.5)
GFR: 73.88 mL/min (ref >60.00)

## 2012-10-18 LAB — PROTIME-INR
INR: 1.2 ratio — ABNORMAL HIGH (ref 0.8–1.0)
Prothrombin Time: 12.3 s (ref 10.2–12.4)

## 2012-10-21 ENCOUNTER — Encounter (HOSPITAL_BASED_OUTPATIENT_CLINIC_OR_DEPARTMENT_OTHER)
Admission: RE | Disposition: A | Discharge status: Home / Self Care | Payer: Self-pay | Source: Ambulatory Visit | Attending: Cardiology

## 2012-10-21 ENCOUNTER — Inpatient Hospital Stay (HOSPITAL_BASED_OUTPATIENT_CLINIC_OR_DEPARTMENT_OTHER)
Admission: RE | Admit: 2012-10-21 | Discharge: 2012-10-21 | Disposition: A | Discharge status: Home / Self Care | Payer: BC Managed Care – HMO | Source: Ambulatory Visit | Attending: Cardiology | Admitting: Cardiology

## 2012-10-21 DIAGNOSIS — I2510 Atherosclerotic heart disease of native coronary artery without angina pectoris: Secondary | ICD-10-CM

## 2012-10-21 DIAGNOSIS — I4891 Unspecified atrial fibrillation: Secondary | ICD-10-CM | POA: Insufficient documentation

## 2012-10-21 DIAGNOSIS — I428 Other cardiomyopathies: Secondary | ICD-10-CM | POA: Insufficient documentation

## 2012-10-21 DIAGNOSIS — Z87891 Personal history of nicotine dependence: Secondary | ICD-10-CM | POA: Insufficient documentation

## 2012-10-21 LAB — POCT I-STAT 3, ART BLOOD GAS (G3+)
Acid-Base Excess: 1 mmol/L (ref 0.0–2.0)
Bicarbonate: 24.8 mEq/L — ABNORMAL HIGH (ref 20.0–24.0)
Bicarbonate: 25.8 mEq/L — ABNORMAL HIGH (ref 20.0–24.0)
O2 Saturation: 62 %
TCO2: 26 mmol/L (ref 0–100)
TCO2: 27 mmol/L (ref 0–100)
pCO2 arterial: 42.3 mmHg (ref 35.0–45.0)
pO2, Arterial: 33 mmHg — CL (ref 80.0–100.0)

## 2012-10-21 SURGERY — JV LEFT AND RIGHT HEART CATHETERIZATION WITH CORONARY ANGIOGRAM

## 2012-10-21 MED ORDER — ACETAMINOPHEN 325 MG PO TABS: 650.0000 mg | ORAL_TABLET | ORAL | Status: DC | PRN

## 2012-10-21 MED ORDER — SODIUM CHLORIDE 0.9 % IV SOLN: INTRAVENOUS | Status: AC

## 2012-10-21 MED ORDER — ONDANSETRON HCL 4 MG/2ML IJ SOLN: 4.0000 mg | Freq: Four times a day (QID) | INTRAMUSCULAR | Status: DC | PRN

## 2012-10-21 NOTE — H&P (View-Only) (Signed)
 HPI The patient presents for followup after a hospitalization in December. He was seen in consultation by us area he apparently had diverticulitis and bowel microperforation. He had paroxysmally in as part of this hospitalization. He had respiratory failure and required intubation. His ejection fraction was 20%.  He was not anticoagulated at bedtime because of his ongoing acute issues. He also did not have a workup of his cardiomyopathy.  The patient presents for followup. He says he's had no prior cardiac history. He stopped drinking many years ago. He actually stopped smoking with his last hospitalization. He said he occasionally felt some palpitations but no presyncope or syncope. He doesn't recall any recently. He doesn't recall any shortness of breath and doesn't have PND or orthopnea. He does some work around his house and climbs 5 stairs into his house routinely.  He doesn't recall any problems with this. He does not report chest pressure, neck or arm discomfort. He's had no weight gain or edema.  .No Known Allergies  Current Outpatient Prescriptions  Medication Sig Dispense Refill  . amiodarone (PACERONE) 200 MG tablet Take 2 tablets (400 mg total) by mouth daily.  30 tablet  0  . carvedilol (COREG) 3.125 MG tablet Take 1 tablet (3.125 mg total) by mouth 2 (two) times daily with a meal.  60 tablet  0  . furosemide (LASIX) 40 MG tablet Take 1 tablet (40 mg total) by mouth daily.  30 tablet  0  . lisinopril (PRINIVIL,ZESTRIL) 2.5 MG tablet Take 2 tablets (5 mg total) by mouth daily.  30 tablet  0  . Multiple Vitamin (MULTIVITAMIN WITH MINERALS) TABS Take 1 tablet by mouth daily.      . Omega-3 Fatty Acids (FISH OIL PO) Take 2 capsules by mouth daily.      . potassium chloride (K-DUR) 10 MEQ tablet Take 2 tablets (20 mEq total) by mouth 2 (two) times daily.  30 tablet  0    Past Medical History  Diagnosis Date  . ETOH abuse     Quit 1998  . Tobacco abuse     Quit Dec 2012  .  Cardiomyopathy, dilated   . Atrial fibrillation     Past Surgical History  Procedure Date  . Neck cyst resection   . Vasectomy     ROS:  As stated in the HPI and negative for all other systems.  PHYSICAL EXAM BP 120/70  Pulse 58  Ht 5' 4" (1.626 m)  Wt 138 lb (62.596 kg)  BMI 23.69 kg/m2 GENERAL:  Well appearing HEENT:  Pupils equal round and reactive, fundi not visualized, oral mucosa unremarkable NECK:  No jugular venous distention, waveform within normal limits, carotid upstroke brisk and symmetric, no bruits, no thyromegaly LYMPHATICS:  No cervical, inguinal adenopathy LUNGS:  Clear to auscultation bilaterally BACK:  No CVA tenderness CHEST:  Unremarkable HEART:  PMI not displaced or sustained,S1 and S2 within normal limits, no S3, no S4, no clicks, no rubs, no murmurs ABD:  Flat, positive bowel sounds normal in frequency in pitch, no bruits, no rebound, no guarding, no midline pulsatile mass, no hepatomegaly, no splenomegaly EXT:  2 plus pulses upper, diminished bilateral lower, no edema, no cyanosis no clubbing SKIN:  No rashes no nodules NEURO:  Cranial nerves II through XII grossly intact, motor grossly intact throughout PSYCH:  Cognitively intact, oriented to person place and time   ASSESSMENT AND PLAN  Cardiomyopathy I'm going to repeat an echocardiogram first to make sure this was not   a cardiomyopathy related to his acute illness. It is still low he will need cardiac catheterization to exclude obstructive coronary disease. The patient understands that risks included but are not limited to stroke (1 in 1000), death (1 in 1000), kidney failure [usually temporary] (1 in 500), bleeding (1 in 200), allergic reaction [possibly serious] (1 in 200).  The patient understands and agrees to proceed if necessary. I will titrate his carvedilol to 6.25 mg twice a day. We discussed salt and fluid restriction. I will also reduce his Lasix to 20 mg daily. Gave him literature on heart  failure/cardiomyopathy.  Atrial fibrillation I likely will stop his amiodarone in the future but for now will reduce it to 200 mg daily. In the absence of any other evidence of atrial fibrillation I will likely avoid committing him to anticoagulation.    

## 2012-10-21 NOTE — OR Nursing (Signed)
Dr Antoine Poche at bedside at 1100 to discuss results and treatment plan with pt and family

## 2012-10-21 NOTE — CV Procedure (Signed)
   Cardiac Catheterization Procedure Note  Name: Ronnie Harvey MRN: [id number] DOB: January 13, 1946  Procedure: Right Heart Cath, Left Heart Cath, Selective Coronary Angiography, LV angiography  Indication:  Cardiomyopathy  Procedural Details: The right groin was prepped, draped, and anesthetized with 1% lidocaine. Using the modified Seldinger technique a 4 French sheath was placed in the right femoral artery and a 7 French sheath was placed in the right femoral vein. A Swan-Ganz catheter was used for the right heart catheterization. Standard protocol was followed for recording of right heart pressures and sampling of oxygen saturations. Fick cardiac output was calculated. Standard Judkins catheters were used for selective coronary angiography and left ventriculography. There were no immediate procedural complications. The patient was transferred to the post catheterization recovery area for further monitoring.  Procedural Findings: Hemodynamics:               RA 3    RV 38/5    PA 45/24   27    PCWP 14    LV 147/33    AO 143/98   Oxygen saturations:    PA 93    AO 62   Cardiac Output (Fick) 3.4                               Cardiac Index (Fick) 2   Coronary angiography:  Coronary dominance: right  Left mainstem:   Long 50% stenosis with calcification.   Left anterior descending (LAD): Wraps the apex.  Long proximal 25% stenosis with moderate calcification.  Mid 40%.  Mid diagonal moderated sized with mid 25% stenosis  Left circumflex (LCx): AV groove proximal long calcified 30% stenosis.  Long distal 40% calcified stenosis.  OM1 moderated to small sized with diffuse long 60 - 70% stenosis.  OM2 tiny.  OM3 moderate sized with long 30% stenosis.  PL x 2 small with nonobstructive plaque.    Right coronary artery (RCA): Relatively large vessel.  Long proximal and mid segment with diffuse 40 - 50% stenosis.  Acute marginal moderate sized with proximal 70% stenosis.  PDA moderate sized  free of high grade stenosis.  Left ventriculography: Left ventricular systolic function is severely reduced with inferior akinesis and global hypokinesis, LVEF is estimated at20%, there is no significant mitral regurgitation   Final Conclusions:  Severe nonischemic cardiomyopathy.  Nonobstructive diffuse CAD.  Recommendations: Continue medical management with risk reduction.     [name] 10/21/2012, 10:33 AM

## 2012-10-21 NOTE — OR Nursing (Signed)
Meal served 

## 2012-10-21 NOTE — OR Nursing (Signed)
Tegaderm dressing applied, site level 0, bedrest began at 1050

## 2012-10-21 NOTE — Interval H&P Note (Signed)
History and Physical Interval Note:  10/21/2012 10:01 AM  Ronnie Harvey  has presented today for surgery, with the diagnosis of cp  The various methods of treatment have been discussed with the patient and family. After consideration of risks, benefits and other options for treatment, the patient has consented to  Procedure(s) (LRB) with comments: JV LEFT AND RIGHT HEART CATHETERIZATION WITH CORONARY ANGIOGRAM (N/A) as a surgical intervention .  The patient's history has been reviewed, patient examined, no change in status, stable for surgery.  I have reviewed the patient's chart and labs.  Questions were answered to the patient's satisfaction.     [name]

## 2012-10-21 NOTE — OR Nursing (Signed)
Discharge instructions reviewed and signed, pt stated understanding, ambulated in hall without difficulty, site level 0, transported to wife's car via wheelchair 

## 2012-11-06 ENCOUNTER — Encounter: Payer: Self-pay | Admitting: Cardiology

## 2012-11-06 ENCOUNTER — Ambulatory Visit (INDEPENDENT_AMBULATORY_CARE_PROVIDER_SITE_OTHER): Payer: BC Managed Care – PPO | Admitting: Cardiology

## 2012-11-06 VITALS — BP 140/70 | HR 67 | Ht 64.0 in | Wt 144.0 lb

## 2012-11-06 DIAGNOSIS — I429 Cardiomyopathy, unspecified: Secondary | ICD-10-CM

## 2012-11-06 MED ORDER — LOSARTAN POTASSIUM 25 MG PO TABS: 25.0000 mg | ORAL_TABLET | Freq: Two times a day (BID) | ORAL | Status: DC

## 2012-11-06 NOTE — Patient Instructions (Addendum)
Please stop you Lisinopril Start Cozaar (losartan) 25 mg twice a day Continue all other medications as listed.  Follow up with Dr Antoine Poche in 2 weeks in the [place] office.

## 2012-11-06 NOTE — Progress Notes (Signed)
HPI The patient presents for followup after a hospitalization in December. He was seen in consultation by [place] at the time of diverticulitis and bowel microperforation. He had atrial fibrillation paroxysmally in as part of this hospitalization. He had respiratory failure and required intubation. His ejection fraction was 20%.  He was not anticoagulated at that time because of his ongoing acute issues. I did repeat an echocardiogram in January confirming his cardiomyopathy. I subsequently did a cardiac catheterization which demonstrated left main 50% stenosis, LAD 40% stenosis, circumflex 30% followed by distal 40% stenosis, small obtuse marginal 60-70% stenosis and right coronary artery 40-50% stenosis. The EF was 20%.  Since I last saw him the patient has had no new acute problems. He does describe some runny nose and cough. He denies any acute shortness of breath, PND or orthopnea. He does not report any palpitations presyncope or syncope.  He has not had any chest pressure, neck or arm discomfort. He has had no weight gain or edema.  .No Known Allergies  Current Outpatient Prescriptions  Medication Sig Dispense Refill  . amiodarone (PACERONE) 200 MG tablet Take 100 mg by mouth daily.      . carvedilol (COREG) 6.25 MG tablet Take 1 tablet (6.25 mg total) by mouth 2 (two) times daily with a meal.  60 tablet  11  . furosemide (LASIX) 20 MG tablet Take 1 tablet (20 mg total) by mouth daily.  30 tablet  11  . lisinopril (PRINIVIL,ZESTRIL) 2.5 MG tablet Take 2 tablets (5 mg total) by mouth daily.  30 tablet  0  . Multiple Vitamin (MULTIVITAMIN WITH MINERALS) TABS Take 1 tablet by mouth daily.      . Omega-3 Fatty Acids (FISH OIL PO) Take 2 capsules by mouth daily.      . potassium chloride (K-DUR) 10 MEQ tablet Take 2 tablets (20 mEq total) by mouth 2 (two) times daily.  30 tablet  0   No current facility-administered medications for this visit.    Past Medical History  Diagnosis Date  . ETOH  abuse     Quit 1998  . Tobacco abuse     Quit Dec 2012  . Cardiomyopathy, dilated   . Atrial fibrillation     Past Surgical History  Procedure Laterality Date  . Neck cyst resection    . Vasectomy      ROS:  As stated in the HPI and negative for all other systems.  PHYSICAL EXAM BP 140/70  Pulse 67  Ht 5\' 4"  (1.626 m)  Wt 144 lb (65.318 kg)  BMI 24.71 kg/m2 GENERAL:  Well appearing HEENT:  Pupils equal round and reactive, fundi not visualized, oral mucosa unremarkable NECK:  No jugular venous distention, waveform within normal limits, carotid upstroke brisk and symmetric, no bruits, no thyromegaly LUNGS:  Clear to auscultation bilaterally BACK:  No CVA tenderness CHEST:  Unremarkable HEART:  PMI not displaced or sustained,S1 and S2 within normal limits, no S3, no S4, no clicks, no rubs, no murmurs ABD:  Flat, positive bowel sounds normal in frequency in pitch, no bruits, no rebound, no guarding, no midline pulsatile mass, no hepatomegaly, no splenomegaly EXT:  2 plus pulses upper, diminished bilateral lower, no edema, no cyanosis no clubbing SKIN:  No rashes no nodules   ASSESSMENT AND PLAN  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 I think his cough is probably related to ACE inhibitor and so I will switch him to Cozaar 25 mg twice daily. I will continue to titrate his  medications.  Atrial fibrillation I likely will stop his amiodarone in the future but for now will continue 200 mg daily. In the absence of any other evidence of atrial fibrillation I will likely avoid committing him to anticoagulation.  CAD He will continue to be managed with risk reduction.

## 2012-11-20 ENCOUNTER — Ambulatory Visit (INDEPENDENT_AMBULATORY_CARE_PROVIDER_SITE_OTHER): Payer: BC Managed Care – PPO | Admitting: Cardiology

## 2012-11-20 ENCOUNTER — Encounter: Payer: Self-pay | Admitting: Cardiology

## 2012-11-20 VITALS — BP 166/83 | HR 70 | Ht 64.0 in | Wt 146.0 lb

## 2012-11-20 DIAGNOSIS — I4891 Unspecified atrial fibrillation: Secondary | ICD-10-CM

## 2012-11-20 MED ORDER — FUROSEMIDE 20 MG PO TABS: 20.0000 mg | ORAL_TABLET | Freq: Every day | ORAL | Status: DC

## 2012-11-20 MED ORDER — LOSARTAN POTASSIUM 50 MG PO TABS: 50.0000 mg | ORAL_TABLET | Freq: Two times a day (BID) | ORAL | Status: DC

## 2012-11-20 MED ORDER — POTASSIUM CHLORIDE ER 10 MEQ PO TBCR: 20.0000 meq | EXTENDED_RELEASE_TABLET | Freq: Every day | ORAL | Status: DC

## 2012-11-20 NOTE — Progress Notes (Signed)
HPI The patient presents for followup after a hospitalization in December. He was seen in consultation by [place] at the time of diverticulitis and bowel microperforation. He had atrial fibrillation paroxysmally in as part of this hospitalization. He had respiratory failure and required intubation. His ejection fraction was 20%.  He was not anticoagulated at that time because of his ongoing acute issues. I did repeat an echocardiogram in January confirming his cardiomyopathy. I subsequently did a cardiac catheterization which demonstrated left main 50% stenosis, LAD 40% stenosis, circumflex 30% followed by distal 40% stenosis, small obtuse marginal 60-70% stenosis and right coronary artery 40-50% stenosis. The EF was 20%.  At the last visit because of call stop the ACE inhibitor or ARB. He thinks his cough is better he still has a bit of a runny nose. The patient denies any new symptoms such as chest discomfort, neck or arm discomfort. There has been no new shortness of breath, PND or orthopnea. There have been no reported palpitations, presyncope or syncope.  He is not exercising as much as I would like.    Marland KitchenNo Known Allergies  Current Outpatient Prescriptions  Medication Sig Dispense Refill  . amiodarone (PACERONE) 200 MG tablet Take 100 mg by mouth daily.      . carvedilol (COREG) 6.25 MG tablet Take 1 tablet (6.25 mg total) by mouth 2 (two) times daily with a meal.  60 tablet  11  . furosemide (LASIX) 20 MG tablet Take 1 tablet (20 mg total) by mouth daily.  30 tablet  11  . losartan (COZAAR) 25 MG tablet Take 1 tablet (25 mg total) by mouth 2 (two) times daily.  60 tablet  11  . Multiple Vitamin (MULTIVITAMIN WITH MINERALS) TABS Take 1 tablet by mouth daily.      . Omega-3 Fatty Acids (FISH OIL PO) Take 2 capsules by mouth daily.      . potassium chloride (K-DUR) 10 MEQ tablet Take 2 tablets (20 mEq total) by mouth 2 (two) times daily.  30 tablet  0   No current facility-administered medications  for this visit.    Past Medical History  Diagnosis Date  . ETOH abuse     Quit 1998  . Tobacco abuse     Quit Dec 2012  . Cardiomyopathy, dilated   . Atrial fibrillation     Past Surgical History  Procedure Laterality Date  . Neck cyst resection    . Vasectomy      ROS:  As stated in the HPI and negative for all other systems.  PHYSICAL EXAM BP 166/83  Pulse 70  Ht 5\' 4"  (1.626 m)  Wt 146 lb (66.225 kg)  BMI 25.05 kg/m2 GENERAL:  Well appearing HEENT:  Pupils equal round and reactive, fundi not visualized, oral mucosa unremarkable NECK:  No jugular venous distention, waveform within normal limits, carotid upstroke brisk and symmetric, no bruits, no thyromegaly LUNGS:  Clear to auscultation bilaterally BACK:  No CVA tenderness CHEST:  Unremarkable HEART:  PMI not displaced or sustained,S1 and S2 within normal limits, no S3, no S4, no clicks, no rubs, no murmurs ABD:  Flat, positive bowel sounds normal in frequency in pitch, no bruits, no rebound, no guarding, no midline pulsatile mass, no hepatomegaly, no splenomegaly EXT:  2 plus pulses upper, diminished bilateral lower, no edema, no cyanosis no clubbing SKIN:  No rashes no nodules   ASSESSMENT AND PLAN  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 Paulene Floor NP increase his Cozaar to 50 mg twice  a day this morning and I agree with this. I will continue to titrate his beta blocker over time.  Atrial fibrillation He will stop his amiodarone when he runs out of his current prescription.  CAD He will continue to be managed with risk reduction.  HTN This is being managed in the context of treating his CHF

## 2012-11-20 NOTE — Patient Instructions (Addendum)
Please stop Amiodarone once you are finished with the bottle you are currently taking.  Double Cozaar (losartan) to 50 mg twice a day  Continue all other medications as listed.  Follow up with Dr Antoine Poche in 1 month in [place].

## 2012-11-27 ENCOUNTER — Other Ambulatory Visit: Payer: Self-pay | Admitting: *Deleted

## 2012-11-27 MED ORDER — LOSARTAN POTASSIUM 50 MG PO TABS: 50.0000 mg | ORAL_TABLET | Freq: Two times a day (BID) | ORAL | Status: DC

## 2012-11-27 NOTE — Telephone Encounter (Signed)
Patient calling to have a 30day refill of Losartan sent to local pharmacy while he waits for [facility] shipment..   Losartan 50mg , #30, 1 refill, [facility] in [place], [place]   [place], [place]

## 2012-12-18 ENCOUNTER — Encounter: Payer: Self-pay | Admitting: Cardiology

## 2012-12-18 ENCOUNTER — Ambulatory Visit (INDEPENDENT_AMBULATORY_CARE_PROVIDER_SITE_OTHER): Payer: BC Managed Care – HMO | Admitting: Cardiology

## 2012-12-18 VITALS — BP 170/88 | HR 63 | Ht 64.0 in | Wt 150.0 lb

## 2012-12-18 DIAGNOSIS — I5021 Acute systolic (congestive) heart failure: Secondary | ICD-10-CM

## 2012-12-18 DIAGNOSIS — I4891 Unspecified atrial fibrillation: Secondary | ICD-10-CM

## 2012-12-18 DIAGNOSIS — I428 Other cardiomyopathies: Secondary | ICD-10-CM

## 2012-12-18 DIAGNOSIS — I429 Cardiomyopathy, unspecified: Secondary | ICD-10-CM

## 2012-12-18 DIAGNOSIS — I2510 Atherosclerotic heart disease of native coronary artery without angina pectoris: Secondary | ICD-10-CM

## 2012-12-18 DIAGNOSIS — I509 Heart failure, unspecified: Secondary | ICD-10-CM

## 2012-12-18 MED ORDER — CARVEDILOL 12.5 MG PO TABS: 12.5000 mg | ORAL_TABLET | Freq: Two times a day (BID) | ORAL | Status: DC

## 2012-12-18 NOTE — Progress Notes (Signed)
HPI The patient presents for followup after a hospitalization in December. He was seen in consultation by [place] at the time of diverticulitis and bowel microperforation. He had atrial fibrillation paroxysmally in as part of this hospitalization. He had respiratory failure and required intubation. His ejection fraction was 20%.  He was not anticoagulated at that time because of his ongoing acute issues. I did repeat an echocardiogram in January confirming his cardiomyopathy. I subsequently did a cardiac catheterization which demonstrated left main 50% stenosis, LAD 40% stenosis, circumflex 30% followed by distal 40% stenosis, small obtuse marginal 60-70% stenosis and right coronary artery 40-50% stenosis. The EF was 20%.  Since I last saw him he has done well.  The patient denies any new symptoms such as chest discomfort, neck or arm discomfort. There has been no new shortness of breath, PND or orthopnea. There have been no reported palpitations, presyncope or syncope.  He thinks that he is feeling better than he has in awhile.    Marland KitchenNo Known Allergies  Current Outpatient Prescriptions  Medication Sig Dispense Refill  . amiodarone (PACERONE) 200 MG tablet Take 100 mg by mouth daily.      . carvedilol (COREG) 6.25 MG tablet Take 1 tablet (6.25 mg total) by mouth 2 (two) times daily with a meal.  60 tablet  11  . furosemide (LASIX) 20 MG tablet Take 1 tablet (20 mg total) by mouth daily.  90 tablet  3  . losartan (COZAAR) 50 MG tablet Take 1 tablet (50 mg total) by mouth 2 (two) times daily.  60 tablet  1  . Multiple Vitamin (MULTIVITAMIN WITH MINERALS) TABS Take 1 tablet by mouth daily.      . Omega-3 Fatty Acids (FISH OIL PO) Take 2 capsules by mouth daily.      . potassium chloride (K-DUR) 10 MEQ tablet Take 2 tablets (20 mEq total) by mouth daily.  180 tablet  3   No current facility-administered medications for this visit.    Past Medical History  Diagnosis Date  . ETOH abuse     Quit 1998    . Tobacco abuse     Quit Dec 2012  . Cardiomyopathy, dilated   . Atrial fibrillation     Past Surgical History  Procedure Laterality Date  . Neck cyst resection    . Vasectomy      ROS:  As stated in the HPI and negative for all other systems.  PHYSICAL EXAM BP 170/88  Pulse 63  Ht 5\' 4"  (1.626 m)  Wt 150 lb (68.04 kg)  BMI 25.73 kg/m2 GENERAL:  Well appearing NECK:  No jugular venous distention, waveform within normal limits, carotid upstroke brisk and symmetric, no bruits, no thyromegaly LUNGS:  Clear to auscultation bilaterally HEART:  PMI not displaced or sustained,S1 and S2 within normal limits, no S3, no S4, no clicks, no rubs, no murmurs ABD:  Flat, positive bowel sounds normal in frequency in pitch, no bruits, no rebound, no guarding, no midline pulsatile mass, no hepatomegaly, no splenomegaly EXT:  2 plus pulses upper, diminished bilateral lower, no edema, no cyanosis no clubbing SKIN:  No rashes no nodules  EKG:  Sinus rhythm, rate 63, axis within normal limits, intervals within normal limits, nonspecific T-wave flattening, LVH with interventricular conduction delay. No change from previous. 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 Today I will increase the Coreg to 12.5 mg bid.    Atrial fibrillation He will stop his amiodarone.  CAD He will continue to be managed with risk reduction.  HTN This is being managed in the context of treating his CHF.  He will likely need spironolactone as his blood pressure is remaining elevated. However, I will consider this at the next appointment.

## 2012-12-18 NOTE — Patient Instructions (Addendum)
Please increase you Carvedilol to 12.5 mg one twice a day. Stop your Amiodarone. Continue all other medications as listed.  Follow up in 1 month with Dr Antoine Poche in [place].

## 2013-01-29 ENCOUNTER — Encounter: Payer: Self-pay | Admitting: Cardiology

## 2013-01-29 ENCOUNTER — Ambulatory Visit (INDEPENDENT_AMBULATORY_CARE_PROVIDER_SITE_OTHER): Payer: BC Managed Care – HMO | Admitting: Cardiology

## 2013-01-29 VITALS — BP 144/82 | HR 73 | Ht 64.0 in | Wt 157.0 lb

## 2013-01-29 DIAGNOSIS — I2510 Atherosclerotic heart disease of native coronary artery without angina pectoris: Secondary | ICD-10-CM

## 2013-01-29 DIAGNOSIS — J811 Chronic pulmonary edema: Secondary | ICD-10-CM

## 2013-01-29 DIAGNOSIS — I4891 Unspecified atrial fibrillation: Secondary | ICD-10-CM

## 2013-01-29 DIAGNOSIS — I428 Other cardiomyopathies: Secondary | ICD-10-CM

## 2013-01-29 DIAGNOSIS — I429 Cardiomyopathy, unspecified: Secondary | ICD-10-CM

## 2013-01-29 MED ORDER — CARVEDILOL 25 MG PO TABS: 25.0000 mg | ORAL_TABLET | Freq: Two times a day (BID) | ORAL | Status: DC

## 2013-01-29 NOTE — Progress Notes (Signed)
   HPI The patient presents for followup after a hospitalization in December. He was seen in consultation by [place] at the time of diverticulitis and bowel microperforation. He had atrial fibrillation paroxysmally in as part of this hospitalization. He had respiratory failure and required intubation. His ejection fraction was 20%.  He was not anticoagulated at that time because of his ongoing acute issues. I did repeat an echocardiogram in January confirming his cardiomyopathy. I subsequently did a cardiac catheterization which demonstrated left main 50% stenosis, LAD 40% stenosis, circumflex 30% followed by distal 40% stenosis, small obtuse marginal 60-70% stenosis and right coronary artery 40-50% stenosis. The EF was 20%.  Since I last saw him he has done well.  The patient denies any new symptoms such as chest discomfort, neck or arm discomfort. He did notice recently that he was dyspneic walking up an incline on his driveway. However, he's not having any resting shortness of breath, PND or orthopnea. At the last visit I increased his carvedilol and he tolerated this without lightheadedness, presyncope or syncope.  I did stop his amiodarone at the last visit. He's had no palpitations.  .No Known Allergies  Current Outpatient Prescriptions  Medication Sig Dispense Refill  . carvedilol (COREG) 12.5 MG tablet Take 1 tablet (12.5 mg total) by mouth 2 (two) times daily with a meal.  60 tablet  11  . furosemide (LASIX) 20 MG tablet Take 1 tablet (20 mg total) by mouth daily.  90 tablet  3  . losartan (COZAAR) 50 MG tablet Take 1 tablet (50 mg total) by mouth 2 (two) times daily.  60 tablet  1  . potassium chloride (K-DUR) 10 MEQ tablet Take 2 tablets (20 mEq total) by mouth daily.  180 tablet  3   No current facility-administered medications for this visit.    Past Medical History  Diagnosis Date  . ETOH abuse     Quit 1998  . Tobacco abuse     Quit Dec 2012  . Cardiomyopathy, dilated   . Atrial  fibrillation     Past Surgical History  Procedure Laterality Date  . Neck cyst resection    . Vasectomy      ROS:  As stated in the HPI and negative for all other systems.  PHYSICAL EXAM BP 144/82  Pulse 73  Ht 5\' 4"  (1.626 m)  Wt 157 lb (71.215 kg)  BMI 26.94 kg/m2 GENERAL:  Well appearing NECK:  No jugular venous distention, waveform within normal limits, carotid upstroke brisk and symmetric, no bruits, no thyromegaly LUNGS:  Clear to auscultation bilaterally HEART:  PMI not displaced or sustained,S1 and S2 within normal limits, no S3, no S4, no clicks, no rubs, no murmurs ABD:  Flat, positive bowel sounds normal in frequency in pitch, no bruits, no rebound, no guarding, no midline pulsatile mass, no hepatomegaly, no splenomegaly EXT:  2 plus pulses upper, diminished bilateral lower, no edema, no cyanosis no clubbing    ASSESSMENT AND PLAN  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 Today I will increase the Coreg to 25 mg bid.    Atrial fibrillation He has had no recurrent dysrhythmias since stopping his amiodarone.  CAD He will continue to be managed with risk reduction.  HTN This is being managed in the context of treating his CHF.  His blood pressure is better controlled than it was previously.

## 2013-01-29 NOTE — Patient Instructions (Addendum)
Please increase your Carvedilol to 25 mg twice a day Continue all other medications as listed.  Follow up in 2 months with Dr Antoine Poche in [place].

## 2013-03-13 ENCOUNTER — Ambulatory Visit (INDEPENDENT_AMBULATORY_CARE_PROVIDER_SITE_OTHER): Payer: Medicare Other

## 2013-03-13 ENCOUNTER — Ambulatory Visit (INDEPENDENT_AMBULATORY_CARE_PROVIDER_SITE_OTHER): Payer: Medicare Other | Admitting: Nurse Practitioner

## 2013-03-13 ENCOUNTER — Encounter: Payer: Self-pay | Admitting: Nurse Practitioner

## 2013-03-13 ENCOUNTER — Telehealth: Payer: Self-pay | Admitting: Nurse Practitioner

## 2013-03-13 VITALS — BP 134/78 | HR 87 | Temp 98.0°F | Ht 64.0 in | Wt 156.0 lb

## 2013-03-13 DIAGNOSIS — R197 Diarrhea, unspecified: Secondary | ICD-10-CM

## 2013-03-13 DIAGNOSIS — R0989 Other specified symptoms and signs involving the circulatory and respiratory systems: Secondary | ICD-10-CM

## 2013-03-13 LAB — COMPLETE METABOLIC PANEL WITH GFR
ALT: 11 U/L (ref 0–53)
AST: 13 U/L (ref 0–37)
Albumin: 4 g/dL (ref 3.5–5.2)
CO2: 22 mEq/L (ref 19–32)
Calcium: 9.3 mg/dL (ref 8.4–10.5)
Chloride: 104 mEq/L (ref 96–112)
Potassium: 4.5 mEq/L (ref 3.5–5.3)

## 2013-03-13 NOTE — Progress Notes (Signed)
  Subjective:    Patient ID: Ronnie Harvey, male    DOB: [birth year], 67 y.o.   MRN: [id number]  HPI  Patient in C/O diarrhea for 2-3 days- Has tried pepto-bismol and that hasn't helped at all. HAs eaten anything that he feels started that. Says that stools are watery and goes 5-6 X a day. Has slowed down some today- he has only been one time today so far.    Review of Systems  All other systems reviewed and are negative.       Objective:   Physical Exam  Constitutional: He appears well-developed and well-nourished.  Cardiovascular: Normal rate, normal heart sounds and intact distal pulses.   Bilateral inguinal bruits audible  Pulmonary/Chest: Effort normal and breath sounds normal.  Abdominal: Soft. Bowel sounds are normal. He exhibits no mass. There is no tenderness. There is no guarding.  Faint abd bruit   BP 134/78  Pulse 87  Temp(Src) 98 F (36.7 C) (Oral)  Ht 5\' 4"  (1.626 m)  Wt 156 lb (70.761 kg)  BMI 26.76 kg/m2  KUB- No acute findings-Preliminary reading by Paulene Floor, FNP  [facility]       Assessment & Plan:  1. Diarrhea immodium AD OTC Containers for stool specimen Force fluids - DG Abd 1 View; Future  2. Femoral bruit Schedule for abdominal US  Mary-Margaret Daphine Deutscher, FNP

## 2013-03-13 NOTE — Telephone Encounter (Signed)
Patient complains of diarrhea for several days despite Immodium use.  Appt scheduled for this afternoon.

## 2013-03-13 NOTE — Patient Instructions (Signed)
Diarrhea Diarrhea is frequent loose and watery bowel movements. It can cause you to feel weak and dehydrated. Dehydration can cause you to become tired and thirsty, have a dry mouth, and have decreased urination that often is dark yellow. Diarrhea is a sign of another problem, most often an infection that will not last long. In most cases, diarrhea typically lasts 2 3 days. However, it can last longer if it is a sign of something more serious. It is important to treat your diarrhea as directed by your caregive to lessen or prevent future episodes of diarrhea. CAUSES  Some common causes include:  Gastrointestinal infections caused by viruses, bacteria, or parasites.  Food poisoning or food allergies.  Certain medicines, such as antibiotics, chemotherapy, and laxatives.  Artificial sweeteners and fructose.  Digestive disorders. HOME CARE INSTRUCTIONS  Ensure adequate fluid intake (hydration): have 1 cup (8 oz) of fluid for each diarrhea episode. Avoid fluids that contain simple sugars or sports drinks, fruit juices, whole milk products, and sodas. Your urine should be clear or pale yellow if you are drinking enough fluids. Hydrate with an oral rehydration solution that you can purchase at pharmacies, retail stores, and online. You can prepare an oral rehydration solution at home by mixing the following ingredients together:    tsp table salt.   tsp baking soda.   tsp salt substitute containing potassium chloride.  1  tablespoons sugar.  1 L (34 oz) of water.  Certain foods and beverages may increase the speed at which food moves through the gastrointestinal (GI) tract. These foods and beverages should be avoided and include:  Caffeinated and alcoholic beverages.  High-fiber foods, such as raw fruits and vegetables, nuts, seeds, and whole grain breads and cereals.  Foods and beverages sweetened with sugar alcohols, such as xylitol, sorbitol, and mannitol.  Some foods may be well  tolerated and may help thicken stool including:  Starchy foods, such as rice, toast, pasta, low-sugar cereal, oatmeal, grits, baked potatoes, crackers, and bagels.  Bananas.  Applesauce.  Add probiotic-rich foods to help increase healthy bacteria in the GI tract, such as yogurt and fermented milk products.  Wash your hands well after each diarrhea episode.  Only take over-the-counter or prescription medicines as directed by your caregiver.  Take a warm bath to relieve any burning or pain from frequent diarrhea episodes. SEEK IMMEDIATE MEDICAL CARE IF:   You are unable to keep fluids down.  You have persistent vomiting.  You have blood in your stool, or your stools are black and tarry.  You do not urinate in 6 8 hours, or there is only a small amount of very dark urine.  You have abdominal pain that increases or localizes.  You have weakness, dizziness, confusion, or lightheadedness.  You have a severe headache.  Your diarrhea gets worse or does not get better.  You have a fever or persistent symptoms for more than 2 3 days.  You have a fever and your symptoms suddenly get worse. MAKE SURE YOU:   Understand these instructions.  Will watch your condition.  Will get help right away if you are not doing well or get worse. Document Released: 08/25/2002 Document Revised: 08/21/2012 Document Reviewed: 05/12/2012 [employer] Patient Information 2014 [employer].  

## 2013-03-14 ENCOUNTER — Other Ambulatory Visit (INDEPENDENT_AMBULATORY_CARE_PROVIDER_SITE_OTHER): Payer: Medicare Other

## 2013-03-14 DIAGNOSIS — R197 Diarrhea, unspecified: Secondary | ICD-10-CM

## 2013-03-17 ENCOUNTER — Other Ambulatory Visit: Payer: Self-pay | Admitting: Nurse Practitioner

## 2013-03-17 ENCOUNTER — Ambulatory Visit (HOSPITAL_COMMUNITY)
Admission: RE | Admit: 2013-03-17 | Discharge: 2013-03-17 | Disposition: A | Discharge status: Home / Self Care | Payer: MEDICARE | Source: Ambulatory Visit | Attending: Nurse Practitioner | Admitting: Nurse Practitioner

## 2013-03-17 DIAGNOSIS — I77811 Abdominal aortic ectasia: Secondary | ICD-10-CM | POA: Insufficient documentation

## 2013-03-17 DIAGNOSIS — R0989 Other specified symptoms and signs involving the circulatory and respiratory systems: Secondary | ICD-10-CM

## 2013-03-17 DIAGNOSIS — I499 Cardiac arrhythmia, unspecified: Secondary | ICD-10-CM | POA: Insufficient documentation

## 2013-04-09 ENCOUNTER — Other Ambulatory Visit: Payer: Self-pay | Admitting: *Deleted

## 2013-04-09 ENCOUNTER — Encounter: Payer: Self-pay | Admitting: Cardiology

## 2013-04-09 ENCOUNTER — Ambulatory Visit (INDEPENDENT_AMBULATORY_CARE_PROVIDER_SITE_OTHER): Payer: BC Managed Care – HMO | Admitting: Cardiology

## 2013-04-09 VITALS — BP 128/68 | HR 75 | Ht 64.0 in | Wt 156.8 lb

## 2013-04-09 DIAGNOSIS — M79609 Pain in unspecified limb: Secondary | ICD-10-CM

## 2013-04-09 DIAGNOSIS — I5021 Acute systolic (congestive) heart failure: Secondary | ICD-10-CM

## 2013-04-09 DIAGNOSIS — I429 Cardiomyopathy, unspecified: Secondary | ICD-10-CM

## 2013-04-09 DIAGNOSIS — I509 Heart failure, unspecified: Secondary | ICD-10-CM

## 2013-04-09 DIAGNOSIS — I4891 Unspecified atrial fibrillation: Secondary | ICD-10-CM

## 2013-04-09 DIAGNOSIS — R0989 Other specified symptoms and signs involving the circulatory and respiratory systems: Secondary | ICD-10-CM

## 2013-04-09 MED ORDER — CARVEDILOL 25 MG PO TABS: 25.0000 mg | ORAL_TABLET | Freq: Two times a day (BID) | ORAL | Status: DC

## 2013-04-09 MED ORDER — SPIRONOLACTONE 25 MG PO TABS: 25.0000 mg | ORAL_TABLET | Freq: Every day | ORAL | Status: DC

## 2013-04-09 NOTE — Progress Notes (Signed)
HPI The patient presents for followup after a hospitalization in December. He was seen in consultation by [place] at the time of diverticulitis and bowel microperforation. He had atrial fibrillation paroxysmally in as part of this hospitalization. He had respiratory failure and required intubation. His ejection fraction was 20%.  He was not anticoagulated at that time because of his ongoing acute issues. I did repeat an echocardiogram in January confirming his cardiomyopathy. I subsequently did a cardiac catheterization which demonstrated left main 50% stenosis, LAD 40% stenosis, circumflex 30% followed by distal 40% stenosis, small obtuse marginal 60-70% stenosis and right coronary artery 40-50% stenosis. The EF was 20%.  Since I last saw him he has done well.  The patient denies any new symptoms such as chest discomfort, neck or arm discomfort. He did notice recently that he was dyspneic walking up an incline on his driveway. However, he's not having any resting shortness of breath, PND or orthopnea. At the last visit I increased his carvedilol and he tolerated this without lightheadedness, presyncope or syncope.  I did stop his amiodarone at the last visit. He's had no palpitations.  .No Known Allergies  Current Outpatient Prescriptions  Medication Sig Dispense Refill  . carvedilol (COREG) 25 MG tablet Take 1 tablet (25 mg total) by mouth 2 (two) times daily with a meal.  60 tablet  11  . furosemide (LASIX) 20 MG tablet Take 1 tablet (20 mg total) by mouth daily.  90 tablet  3  . losartan (COZAAR) 50 MG tablet Take 1 tablet (50 mg total) by mouth 2 (two) times daily.  60 tablet  1  . potassium chloride (K-DUR) 10 MEQ tablet Take 2 tablets (20 mEq total) by mouth daily.  180 tablet  3   No current facility-administered medications for this visit.    Past Medical History  Diagnosis Date  . ETOH abuse     Quit 1998  . Tobacco abuse     Quit Dec 2012  . Cardiomyopathy, dilated   . Atrial  fibrillation     Past Surgical History  Procedure Laterality Date  . Neck cyst resection    . Vasectomy      ROS:  Leg pain, recent abdominal pain.  otherwise as stated in the HPI and negative for all other systems.  PHYSICAL EXAM BP 128/68  Pulse 75  Ht 5\' 4"  (1.626 m)  Wt 156 lb 12.8 oz (71.124 kg)  BMI 26.9 kg/m2 GENERAL:  Well appearing HEENT: PERRL NECK:  No jugular venous distention, waveform within normal limits, carotid upstroke brisk and symmetric, subtle carotid bilateral bruits, no thyromegaly LUNGS:  Clear to auscultation bilaterally HEART:  PMI not displaced or sustained,S1 and S2 within normal limits, no S3, no S4, no clicks, no rubs, no murmurs ABD:  Flat, positive bowel sounds normal in frequency in pitch, positive midline soft bruits, no rebound, no guarding, no midline pulsatile mass, no hepatomegaly, no splenomegaly EXT:  2 plus pulses upper, diminished bilateral lower, left femoral bruit no edema, no cyanosis no clubbing NEURO:  Nonfocal    ASSESSMENT AND PLAN  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 Today I will add spironolactone 25 mg daily. His potassium supplementation and get basic metabolic profiles per protocol.  Atrial fibrillation He has had no recurrent dysrhythmias since stopping his amiodarone.  CAD This is nonobstructive.  He will continue to be managed with risk reduction.  HTN This is being managed in the context of treating his CHF.  His blood pressure is  better controlled than it was previously.  CAROTID BRUIT I will order carotid Dopplers.  PVD He has symptoms consistent with possible claudication reduced pulses in bruit. I will get ABIs.

## 2013-04-09 NOTE — Patient Instructions (Addendum)
Please start Spironolactone 25 mg a day, Stop Potassium Chloride, Continue all other medications as listed.  Please have blood work on Monday April 14, 2013 and the following Monday at [facility].  Your physician has requested that you have a carotid duplex. This test is an ultrasound of the carotid arteries in your neck. It looks at blood flow through these arteries that supply the brain with blood. Allow one hour for this exam. There are no restrictions or special instructions.  Your physician has requested that you have a lower extremity arterial exercise duplex. During this test, exercise and ultrasound are used to evaluate arterial blood flow in the legs. Allow one hour for this exam. There are no restrictions or special instructions.   Follow up with Dr Antoine Poche in [place] in 1 month.

## 2013-04-14 ENCOUNTER — Other Ambulatory Visit (INDEPENDENT_AMBULATORY_CARE_PROVIDER_SITE_OTHER): Payer: Medicare Other

## 2013-04-14 DIAGNOSIS — I5021 Acute systolic (congestive) heart failure: Secondary | ICD-10-CM

## 2013-04-14 DIAGNOSIS — I509 Heart failure, unspecified: Secondary | ICD-10-CM

## 2013-04-14 NOTE — Progress Notes (Signed)
Patient came in with orders from [facility] hochrein

## 2013-04-15 LAB — BMP8+EGFR
BUN/Creatinine Ratio: 19 (ref 10–22)
BUN: 21 mg/dL (ref 8–27)
Creatinine, Ser: 1.1 mg/dL (ref 0.76–1.27)
GFR calc Af Amer: 79 mL/min/{1.73_m2} (ref >59)
GFR calc non Af Amer: 69 mL/min/{1.73_m2} (ref >59)
Glucose: 93 mg/dL (ref 65–99)

## 2013-04-16 ENCOUNTER — Encounter (INDEPENDENT_AMBULATORY_CARE_PROVIDER_SITE_OTHER): Payer: BC Managed Care – HMO

## 2013-04-16 DIAGNOSIS — I6529 Occlusion and stenosis of unspecified carotid artery: Secondary | ICD-10-CM

## 2013-04-16 DIAGNOSIS — R0989 Other specified symptoms and signs involving the circulatory and respiratory systems: Secondary | ICD-10-CM

## 2013-04-16 DIAGNOSIS — I739 Peripheral vascular disease, unspecified: Secondary | ICD-10-CM

## 2013-04-16 DIAGNOSIS — I70219 Atherosclerosis of native arteries of extremities with intermittent claudication, unspecified extremity: Secondary | ICD-10-CM

## 2013-04-17 ENCOUNTER — Encounter (INDEPENDENT_AMBULATORY_CARE_PROVIDER_SITE_OTHER): Payer: BC Managed Care – HMO

## 2013-04-17 DIAGNOSIS — I70219 Atherosclerosis of native arteries of extremities with intermittent claudication, unspecified extremity: Secondary | ICD-10-CM

## 2013-04-17 DIAGNOSIS — I700 Atherosclerosis of aorta: Secondary | ICD-10-CM

## 2013-04-21 ENCOUNTER — Other Ambulatory Visit (INDEPENDENT_AMBULATORY_CARE_PROVIDER_SITE_OTHER): Payer: Medicare Other

## 2013-04-21 DIAGNOSIS — I509 Heart failure, unspecified: Secondary | ICD-10-CM

## 2013-04-21 DIAGNOSIS — I5021 Acute systolic (congestive) heart failure: Secondary | ICD-10-CM

## 2013-04-21 NOTE — Progress Notes (Signed)
Patient came in for labs only.

## 2013-04-22 LAB — BMP8+EGFR
BUN/Creatinine Ratio: 23 — ABNORMAL HIGH (ref 10–22)
Chloride: 102 mmol/L (ref 97–108)
GFR calc Af Amer: 92 mL/min/{1.73_m2} (ref >59)
Potassium: 4.5 mmol/L (ref 3.5–5.2)
Sodium: 141 mmol/L (ref 134–144)

## 2013-05-14 ENCOUNTER — Ambulatory Visit (INDEPENDENT_AMBULATORY_CARE_PROVIDER_SITE_OTHER): Payer: BC Managed Care – HMO | Admitting: Cardiology

## 2013-05-14 ENCOUNTER — Encounter: Payer: Self-pay | Admitting: Cardiology

## 2013-05-14 VITALS — BP 136/74 | HR 71 | Ht 64.0 in | Wt 155.0 lb

## 2013-05-14 DIAGNOSIS — E7800 Pure hypercholesterolemia, unspecified: Secondary | ICD-10-CM

## 2013-05-14 DIAGNOSIS — I5021 Acute systolic (congestive) heart failure: Secondary | ICD-10-CM

## 2013-05-14 DIAGNOSIS — I4891 Unspecified atrial fibrillation: Secondary | ICD-10-CM

## 2013-05-14 DIAGNOSIS — I739 Peripheral vascular disease, unspecified: Secondary | ICD-10-CM

## 2013-05-14 DIAGNOSIS — I2510 Atherosclerotic heart disease of native coronary artery without angina pectoris: Secondary | ICD-10-CM

## 2013-05-14 DIAGNOSIS — I509 Heart failure, unspecified: Secondary | ICD-10-CM

## 2013-05-14 MED ORDER — ATORVASTATIN CALCIUM 40 MG PO TABS: 40.0000 mg | ORAL_TABLET | Freq: Every day | ORAL | Status: DC

## 2013-05-14 NOTE — Progress Notes (Signed)
HPI The patient presents for followup after a hospitalization in December. He was seen in consultation by [place] at the time of diverticulitis and bowel microperforation. He had atrial fibrillation paroxysmally in as part of this hospitalization. He had respiratory failure and required intubation. His ejection fraction was 20%.  He was not anticoagulated at that time because of his ongoing acute issues. I did repeat an echocardiogram in January confirming his cardiomyopathy. I subsequently did a cardiac catheterization which demonstrated left main 50% stenosis, LAD 40% stenosis, circumflex 30% followed by distal 40% stenosis, small obtuse marginal 60-70% stenosis and right coronary artery 40-50% stenosis. The EF was 20%.    Since I last saw him he has done well.  At the last visit I did an spironolactone. Follow up labs were OK.  The patient denies any new symptoms such as chest discomfort, neck or arm discomfort.  However, he's not having any resting shortness of breath, PND or orthopnea.  He does continue to have some dyspnea walking up an incline and also describes leg tiredness when he is doing this. He's not having any palpitations, presyncope or syncope.  .No Known Allergies  Current Outpatient Prescriptions  Medication Sig Dispense Refill  . carvedilol (COREG) 25 MG tablet Take 1 tablet (25 mg total) by mouth 2 (two) times daily with a meal.  180 tablet  3  . furosemide (LASIX) 20 MG tablet Take 1 tablet (20 mg total) by mouth daily.  90 tablet  3  . losartan (COZAAR) 50 MG tablet Take 1 tablet (50 mg total) by mouth 2 (two) times daily.  60 tablet  1  . spironolactone (ALDACTONE) 25 MG tablet Take 1 tablet (25 mg total) by mouth daily.  90 tablet  3   No current facility-administered medications for this visit.    Past Medical History  Diagnosis Date  . ETOH abuse     Quit 1998  . Tobacco abuse     Quit Dec 2012  . Cardiomyopathy, dilated   . Atrial fibrillation     Past Surgical  History  Procedure Laterality Date  . Neck cyst resection    . Vasectomy      ROS:  Leg pain, recent abdominal pain.  otherwise as stated in the HPI and negative for all other systems.  PHYSICAL EXAM BP 136/74  Pulse 71  Ht 5\' 4"  (1.626 m)  Wt 155 lb (70.308 kg)  BMI 26.59 kg/m2 GENERAL:  Well appearing HEENT: PERRL NECK:  No jugular venous distention, waveform within normal limits, carotid upstroke brisk and symmetric, subtle carotid bilateral bruits, no thyromegaly LUNGS:  Clear to auscultation bilaterally HEART:  PMI not displaced or sustained,S1 and S2 within normal limits, no S3, no S4, no clicks, no rubs, no murmurs ABD:  Flat, positive bowel sounds normal in frequency in pitch, positive midline soft bruits, no rebound, no guarding, no midline pulsatile mass, no hepatomegaly, no splenomegaly EXT:  2 plus pulses upper, diminished bilateral lower, left femoral bruit no edema, no cyanosis no clubbing NEURO:  Nonfocal  EKG:  Sinus rhythm, rate 61, axis within normal limits, minimal will criteria for left ventricular hypertrophy with lateral T-wave inversions unchanged from previous. 05/14/2013  ASSESSMENT AND PLAN  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 He has really class I symptoms. No further medication is indicated. I will followup future echocardiograms.  Atrial fibrillation He has had no recurrent dysrhythmias since stopping his amiodarone.  CAD This is nonobstructive.  He will continue to be managed with  risk reduction.  HTN This is being managed in the context of treating his CHF.  His blood pressure is better controlled than it was previously.  CAROTID BRUIT He has 40-59% right stenosis and 60-79% left stenosis and will have followup around January.  PVD He does have some severe bilateral lower extremity stenosis. He had symptoms. I will schedule for him to see Dr. Kirke Corin.  Given his atherosclerotic disease I would like to start him on statin and I will begin Lipitor  40 mg daily. He will get a lipid profile fasting.

## 2013-05-14 NOTE — Patient Instructions (Addendum)
Please start Lipitor 40 (Atrovastatin)mg a day. Continue all other medications as listed.  Please have a fasting lipid profile at your primary care doctor's office.  You have been referred to Dr Kirke Corin for the evaluation of leg pain.  Follow up in 6 months with Dr Antoine Poche.  You will receive a letter in the mail 2 months before you are due.  Please call us when you receive this letter to schedule your follow up appointment.

## 2013-05-15 ENCOUNTER — Other Ambulatory Visit (INDEPENDENT_AMBULATORY_CARE_PROVIDER_SITE_OTHER): Payer: Medicare Other

## 2013-05-15 DIAGNOSIS — E7800 Pure hypercholesterolemia, unspecified: Secondary | ICD-10-CM

## 2013-05-16 LAB — LIPID PANEL
Chol/HDL Ratio: 6.5 ratio units — ABNORMAL HIGH (ref 0.0–5.0)
LDL Calculated: 108 mg/dL — ABNORMAL HIGH (ref 0–99)
Triglycerides: 308 mg/dL — ABNORMAL HIGH (ref 0–149)
VLDL Cholesterol Cal: 62 mg/dL — ABNORMAL HIGH (ref 5–40)

## 2013-05-20 ENCOUNTER — Ambulatory Visit (INDEPENDENT_AMBULATORY_CARE_PROVIDER_SITE_OTHER): Payer: BC Managed Care – HMO | Admitting: Cardiovascular Disease

## 2013-05-20 ENCOUNTER — Other Ambulatory Visit: Payer: Self-pay | Admitting: Cardiovascular Disease

## 2013-05-20 ENCOUNTER — Encounter: Payer: Self-pay | Admitting: Cardiovascular Disease

## 2013-05-20 VITALS — BP 132/80 | HR 78 | Ht 64.0 in | Wt 157.0 lb

## 2013-05-20 DIAGNOSIS — I739 Peripheral vascular disease, unspecified: Secondary | ICD-10-CM

## 2013-05-20 DIAGNOSIS — I2510 Atherosclerotic heart disease of native coronary artery without angina pectoris: Secondary | ICD-10-CM

## 2013-05-20 DIAGNOSIS — I429 Cardiomyopathy, unspecified: Secondary | ICD-10-CM

## 2013-05-20 DIAGNOSIS — I428 Other cardiomyopathies: Secondary | ICD-10-CM

## 2013-05-20 LAB — CBC WITH DIFFERENTIAL/PLATELET
Basophils Absolute: 0 10*3/uL (ref 0.0–0.1)
Basophils Relative: 0.5 % (ref 0.0–3.0)
Eosinophils Absolute: 0 10*3/uL (ref 0.0–0.7)
Eosinophils Relative: 0 % (ref 0.0–5.0)
Lymphocytes Relative: 22.3 % (ref 12.0–46.0)
Lymphs Abs: 2.1 10*3/uL (ref 0.7–4.0)
MCV: 93.6 fl (ref 78.0–100.0)
Monocytes Absolute: 0.7 10*3/uL (ref 0.1–1.0)
Monocytes Relative: 7.7 % (ref 3.0–12.0)
Neutrophils Relative %: 69.5 % (ref 43.0–77.0)
RBC: 4.28 Mil/uL (ref 4.22–5.81)
WBC: 9.3 10*3/uL (ref 4.5–10.5)

## 2013-05-20 LAB — BASIC METABOLIC PANEL
Calcium: 8.9 mg/dL (ref 8.4–10.5)
Chloride: 106 mEq/L (ref 96–112)
Creatinine, Ser: 1.2 mg/dL (ref 0.4–1.5)
GFR: 64.53 mL/min (ref >60.00)

## 2013-05-20 MED ORDER — ASPIRIN EC 81 MG PO TBEC: 81.0000 mg | DELAYED_RELEASE_TABLET | Freq: Every day | ORAL | Status: AC

## 2013-05-20 NOTE — Assessment & Plan Note (Signed)
No symptoms of angina. Continue medical therapy. 

## 2013-05-20 NOTE — Assessment & Plan Note (Signed)
He is on optimal medical therapy and appears to be euvolemic.

## 2013-05-20 NOTE — Patient Instructions (Addendum)
Your physician has recommended you make the following change in your medication: START Aspirin 81mg  take one by mouth daily  Your physician has requested that you have a peripheral vascular angiogram. This exam is performed at the hospital. During this exam IV contrast is used to look at arterial blood flow. Please review the information sheet given for details.

## 2013-05-20 NOTE — Progress Notes (Signed)
HPI The patient is a 67 year old male who was referred by Dr. Antoine Poche for evaluation and management of PAD. He was hospitalized in December for diverticulitis and bowel microperforation. He had paroxysmal atrial fibrillation during his hospitalization . He had respiratory failure and required intubation. His ejection fraction was 20%. Repeat echocardiogram in January confirmed his cardiomyopathy. Cardiac catheterization showed left main 50% stenosis, LAD 40% stenosis, circumflex 30% followed by distal 40% stenosis, small obtuse marginal 60-70% stenosis and right coronary artery 40-50% stenosis. The EF was 20%.   He has been treated medically for CAD and cardiomyopathy. He was asked to increase his physical activities and exercise to improve his cardiomyopathy and heart failure. However, he has been limited by severe claudication affecting his lower back, buttock and  Thigh. It is significantly worse on the right side. The discomfort starts after walking about 100 yards but it can happen even shorter distance if he walks fast. He quit smoking since his hospitalization. He is not diabetic.  Marland KitchenNo Known Allergies  Current Outpatient Prescriptions  Medication Sig Dispense Refill  . atorvastatin (LIPITOR) 40 MG tablet Take 1 tablet (40 mg total) by mouth daily.  90 tablet  3  . carvedilol (COREG) 25 MG tablet Take 1 tablet (25 mg total) by mouth 2 (two) times daily with a meal.  180 tablet  3  . furosemide (LASIX) 20 MG tablet Take 1 tablet (20 mg total) by mouth daily.  90 tablet  3  . losartan (COZAAR) 50 MG tablet Take 1 tablet (50 mg total) by mouth 2 (two) times daily.  60 tablet  1  . spironolactone (ALDACTONE) 25 MG tablet Take 1 tablet (25 mg total) by mouth daily.  90 tablet  3  . aspirin EC 81 MG tablet Take 1 tablet (81 mg total) by mouth daily.       No current facility-administered medications for this visit.    Past Medical History  Diagnosis Date  . ETOH abuse     Quit 1998  .  Tobacco abuse     Quit Dec 2012  . Cardiomyopathy, dilated   . Atrial fibrillation     Past Surgical History  Procedure Laterality Date  . Neck cyst resection    . Vasectomy      ROS:  Significant claudication worse on the right side. Stable dyspnea.otherwise as stated in the HPI and negative for all other systems.  PHYSICAL EXAM BP 132/80  Pulse 78  Ht 5\' 4"  (1.626 m)  Wt 157 lb (71.215 kg)  BMI 26.94 kg/m2  SpO2 94% Constitutional: He is oriented to person, place, and time. He appears well-developed and well-nourished. No distress.  HENT: No nasal discharge.  Head: Normocephalic and atraumatic.  Eyes: Pupils are equal and round. Right eye exhibits no discharge. Left eye exhibits no discharge.  Neck: Normal range of motion. Neck supple. No JVD present. No thyromegaly present. Faint carotid bruits Cardiovascular: Normal rate, regular rhythm, normal heart sounds and. Exam reveals no gallop and no friction rub. No murmur heard.  Pulmonary/Chest: Effort normal and breath sounds normal. No stridor. No respiratory distress. He has no wheezes. He has no rales. He exhibits no tenderness.  Abdominal: Soft. Bowel sounds are normal. He exhibits no distension. There is no tenderness. There is no rebound and no guarding.  Musculoskeletal: Normal range of motion. He exhibits no edema and no tenderness.  Neurological: He is alert and oriented to person, place, and time. Coordination normal.  Skin:  Skin is warm and dry. No rash noted. He is not diaphoretic. No erythema. No pallor.  Psychiatric: He has a normal mood and affect. His behavior is normal. Judgment and thought content normal.  Vascular: Radial pulses are normal bilaterally. Femoral pulses: Very diminished on both sides with bilateral bruits. Distal pulses are palpable on the left side but not on the right side      ASSESSMENT AND PLAN

## 2013-05-20 NOTE — Assessment & Plan Note (Signed)
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 ABI was 0.69 on the right and 0.83 on the left. His symptoms are clearly worse on the right side than the left side. He has evidence of severe focal right external iliac artery stenosis as well as significant left common iliac artery stenosis. Due to severity of his symptoms which are affecting his ability to engage in a regular exercise, I recommend proceeding with abdominal aortogram, lower extremity runoff and possible angioplasty. Risks, benefits and alternatives were discussed with him.  I do think that improving is ability to exercise should have a positive impact on his cardiac status. Start aspirin 81 mg once daily.

## 2013-05-21 ENCOUNTER — Encounter (HOSPITAL_COMMUNITY): Payer: Self-pay | Admitting: Pharmacy Technician

## 2013-05-22 ENCOUNTER — Telehealth: Payer: Self-pay | Admitting: Cardiology

## 2013-05-22 DIAGNOSIS — E7800 Pure hypercholesterolemia, unspecified: Secondary | ICD-10-CM

## 2013-05-22 NOTE — Telephone Encounter (Signed)
Follow up    Patient calling stating someone called him yesterday returning call back

## 2013-05-22 NOTE — Telephone Encounter (Signed)
I spoke with the pt and he was returning a nurse's call about the results of his cholesterol.  I made the pt aware of these results and he is taking Atorvastatin as directed.  The pt will be due to have a lipid and liver profile the 2nd week of November. The pt would like to have these labs drawn at [facility].  I will place the orders in EPIC.

## 2013-05-28 ENCOUNTER — Other Ambulatory Visit: Payer: Self-pay | Admitting: Cardiovascular Disease

## 2013-05-28 ENCOUNTER — Observation Stay (HOSPITAL_COMMUNITY)
Admission: RE | Admit: 2013-05-28 | Discharge: 2013-05-29 | Disposition: A | Discharge status: Home / Self Care | Payer: MEDICARE | Source: Ambulatory Visit | Attending: Cardiovascular Disease | Admitting: Cardiovascular Disease

## 2013-05-28 ENCOUNTER — Encounter (HOSPITAL_COMMUNITY)
Admission: RE | Disposition: A | Discharge status: Home / Self Care | Payer: Self-pay | Source: Ambulatory Visit | Attending: Cardiovascular Disease

## 2013-05-28 DIAGNOSIS — J449 Chronic obstructive pulmonary disease, unspecified: Secondary | ICD-10-CM | POA: Diagnosis present

## 2013-05-28 DIAGNOSIS — J4489 Other specified chronic obstructive pulmonary disease: Secondary | ICD-10-CM | POA: Insufficient documentation

## 2013-05-28 DIAGNOSIS — IMO0002 Reserved for concepts with insufficient information to code with codable children: Secondary | ICD-10-CM | POA: Insufficient documentation

## 2013-05-28 DIAGNOSIS — Y849 Medical procedure, unspecified as the cause of abnormal reaction of the patient, or of later complication, without mention of misadventure at the time of the procedure: Secondary | ICD-10-CM | POA: Insufficient documentation

## 2013-05-28 DIAGNOSIS — I2510 Atherosclerotic heart disease of native coronary artery without angina pectoris: Secondary | ICD-10-CM | POA: Diagnosis present

## 2013-05-28 DIAGNOSIS — T148XXA Other injury of unspecified body region, initial encounter: Secondary | ICD-10-CM

## 2013-05-28 DIAGNOSIS — I428 Other cardiomyopathies: Secondary | ICD-10-CM

## 2013-05-28 DIAGNOSIS — I708 Atherosclerosis of other arteries: Principal | ICD-10-CM | POA: Insufficient documentation

## 2013-05-28 DIAGNOSIS — I739 Peripheral vascular disease, unspecified: Secondary | ICD-10-CM

## 2013-05-28 DIAGNOSIS — I4891 Unspecified atrial fibrillation: Secondary | ICD-10-CM | POA: Diagnosis present

## 2013-05-28 DIAGNOSIS — I70219 Atherosclerosis of native arteries of extremities with intermittent claudication, unspecified extremity: Secondary | ICD-10-CM | POA: Insufficient documentation

## 2013-05-28 DIAGNOSIS — Z79899 Other long term (current) drug therapy: Secondary | ICD-10-CM | POA: Insufficient documentation

## 2013-05-28 HISTORY — DX: Paroxysmal atrial fibrillation: I48.0

## 2013-05-28 HISTORY — DX: Atherosclerotic heart disease of native coronary artery without angina pectoris: I25.10

## 2013-05-28 HISTORY — DX: Peripheral vascular disease, unspecified: I73.9

## 2013-05-28 HISTORY — PX: PERCUTANEOUS STENT INTERVENTION: SHX5500

## 2013-05-28 HISTORY — PX: ABDOMINAL AORTAGRAM: SHX5454

## 2013-05-28 LAB — PROTIME-INR
INR: 1.05 (ref 0.00–1.49)
Prothrombin Time: 13.5 seconds (ref 11.6–15.2)

## 2013-05-28 LAB — CBC
HCT: 38.3 % — ABNORMAL LOW (ref 39.0–52.0)
Hemoglobin: 13.8 g/dL (ref 13.0–17.0)
MCH: 33.2 pg (ref 26.0–34.0)
MCHC: 36 g/dL (ref 30.0–36.0)
MCV: 92.7 fL (ref 78.0–100.0)
MCV: 92.1 fL (ref 78.0–100.0)
Platelets: 236 10*3/uL (ref 150–400)
RDW: 12.2 % (ref 11.5–15.5)
WBC: 14.5 10*3/uL — ABNORMAL HIGH (ref 4.0–10.5)

## 2013-05-28 LAB — CBC WITH DIFFERENTIAL/PLATELET
Basophils Relative: 0 % (ref 0–1)
Eosinophils Absolute: 0 10*3/uL (ref 0.0–0.7)
Hemoglobin: 12.9 g/dL — ABNORMAL LOW (ref 13.0–17.0)
Lymphs Abs: 1.9 10*3/uL (ref 0.7–4.0)
Monocytes Relative: 10 % (ref 3–12)
Neutro Abs: 7.7 10*3/uL (ref 1.7–7.7)
Neutrophils Relative %: 72 % (ref 43–77)
Platelets: 189 10*3/uL (ref 150–400)
RBC: 4.01 MIL/uL — ABNORMAL LOW (ref 4.22–5.81)

## 2013-05-28 LAB — CREATININE, SERUM: Creatinine, Ser: 0.95 mg/dL (ref 0.50–1.35)

## 2013-05-28 LAB — POCT ACTIVATED CLOTTING TIME: Activated Clotting Time: 324 seconds

## 2013-05-28 SURGERY — ABDOMINAL AORTAGRAM
Anesthesia: LOCAL | Laterality: Right

## 2013-05-28 MED ORDER — NITROGLYCERIN 0.4 MG SL SUBL: 0.4000 mg | SUBLINGUAL_TABLET | SUBLINGUAL | Status: DC | PRN

## 2013-05-28 MED ORDER — HYDRALAZINE HCL 20 MG/ML IJ SOLN
10.0000 mg | Freq: Once | INTRAMUSCULAR | Status: AC
  Administered 2013-05-28: 10 mg via INTRAVENOUS

## 2013-05-28 MED ORDER — LABETALOL HCL 5 MG/ML IV SOLN
INTRAVENOUS | Status: AC
  Filled 2013-05-28: qty 4

## 2013-05-28 MED ORDER — SODIUM CHLORIDE 0.9 % IV SOLN: INTRAVENOUS | Status: AC

## 2013-05-28 MED ORDER — CLOPIDOGREL BISULFATE 75 MG PO TABS: 75.0000 mg | ORAL_TABLET | Freq: Every day | ORAL | Status: DC

## 2013-05-28 MED ORDER — SODIUM CHLORIDE 0.9 % IV SOLN: 250.0000 mL | INTRAVENOUS | Status: DC | PRN

## 2013-05-28 MED ORDER — HYDRALAZINE HCL 20 MG/ML IJ SOLN
INTRAMUSCULAR | Status: AC
  Filled 2013-05-28: qty 1

## 2013-05-28 MED ORDER — ONDANSETRON HCL 4 MG/2ML IJ SOLN: 4.0000 mg | Freq: Four times a day (QID) | INTRAMUSCULAR | Status: DC | PRN

## 2013-05-28 MED ORDER — LOSARTAN POTASSIUM 50 MG PO TABS
50.0000 mg | ORAL_TABLET | Freq: Two times a day (BID) | ORAL | Status: DC
  Administered 2013-05-28 – 2013-05-29 (×2): 50 mg via ORAL
  Filled 2013-05-28 (×4): qty 1

## 2013-05-28 MED ORDER — DIAZEPAM 5 MG PO TABS
ORAL_TABLET | ORAL | Status: AC
  Filled 2013-05-28: qty 1

## 2013-05-28 MED ORDER — SPIRONOLACTONE 25 MG PO TABS
25.0000 mg | ORAL_TABLET | Freq: Every day | ORAL | Status: DC
  Administered 2013-05-29: 25 mg via ORAL
  Filled 2013-05-28 (×2): qty 1

## 2013-05-28 MED ORDER — FENTANYL CITRATE 0.05 MG/ML IJ SOLN
INTRAMUSCULAR | Status: AC
  Filled 2013-05-28: qty 2

## 2013-05-28 MED ORDER — DIAZEPAM 5 MG PO TABS
5.0000 mg | ORAL_TABLET | ORAL | Status: AC
  Administered 2013-05-28: 5 mg via ORAL

## 2013-05-28 MED ORDER — HEPARIN (PORCINE) IN NACL 2-0.9 UNIT/ML-% IJ SOLN
INTRAMUSCULAR | Status: AC
  Filled 2013-05-28: qty 500

## 2013-05-28 MED ORDER — ASPIRIN EC 81 MG PO TBEC
81.0000 mg | DELAYED_RELEASE_TABLET | Freq: Every day | ORAL | Status: DC
  Administered 2013-05-29: 81 mg via ORAL
  Filled 2013-05-28 (×2): qty 1

## 2013-05-28 MED ORDER — SODIUM CHLORIDE 0.9 % IV SOLN
INTRAVENOUS | Status: DC
  Administered 2013-05-28: 07:00:00 via INTRAVENOUS

## 2013-05-28 MED ORDER — HEPARIN SODIUM (PORCINE) 5000 UNIT/ML IJ SOLN
5000.0000 [IU] | Freq: Three times a day (TID) | INTRAMUSCULAR | Status: DC
  Filled 2013-05-28 (×5): qty 1

## 2013-05-28 MED ORDER — CARVEDILOL 25 MG PO TABS
25.0000 mg | ORAL_TABLET | Freq: Two times a day (BID) | ORAL | Status: DC
  Administered 2013-05-29: 25 mg via ORAL
  Filled 2013-05-28 (×3): qty 1

## 2013-05-28 MED ORDER — CLOPIDOGREL BISULFATE 75 MG PO TABS
75.0000 mg | ORAL_TABLET | Freq: Every day | ORAL | Status: DC
Start: 2013-05-28 — End: 2013-05-29
  Administered 2013-05-29: 75 mg via ORAL
  Filled 2013-05-28 (×2): qty 1

## 2013-05-28 MED ORDER — FUROSEMIDE 20 MG PO TABS
20.0000 mg | ORAL_TABLET | Freq: Every day | ORAL | Status: DC
  Administered 2013-05-29: 20 mg via ORAL
  Filled 2013-05-28 (×2): qty 1

## 2013-05-28 MED ORDER — ATORVASTATIN CALCIUM 40 MG PO TABS
40.0000 mg | ORAL_TABLET | Freq: Every day | ORAL | Status: DC
  Administered 2013-05-29: 40 mg via ORAL
  Filled 2013-05-28 (×2): qty 1

## 2013-05-28 MED ORDER — ASPIRIN 81 MG PO CHEW
324.0000 mg | CHEWABLE_TABLET | ORAL | Status: AC
  Administered 2013-05-28: 324 mg via ORAL

## 2013-05-28 MED ORDER — CLOPIDOGREL BISULFATE 300 MG PO TABS
ORAL_TABLET | ORAL | Status: AC
  Filled 2013-05-28: qty 2

## 2013-05-28 MED ORDER — ASPIRIN 81 MG PO CHEW
CHEWABLE_TABLET | ORAL | Status: AC
  Filled 2013-05-28: qty 4

## 2013-05-28 MED ORDER — LIDOCAINE HCL (PF) 1 % IJ SOLN
INTRAMUSCULAR | Status: AC
  Filled 2013-05-28: qty 30

## 2013-05-28 MED ORDER — MIDAZOLAM HCL 2 MG/2ML IJ SOLN
INTRAMUSCULAR | Status: AC
  Filled 2013-05-28: qty 2

## 2013-05-28 MED ORDER — SODIUM CHLORIDE 0.9 % IJ SOLN: 3.0000 mL | INTRAMUSCULAR | Status: DC | PRN

## 2013-05-28 MED ORDER — ACETAMINOPHEN 325 MG PO TABS: 650.0000 mg | ORAL_TABLET | ORAL | Status: DC | PRN

## 2013-05-28 MED ORDER — SODIUM CHLORIDE 0.9 % IJ SOLN: 3.0000 mL | Freq: Two times a day (BID) | INTRAMUSCULAR | Status: DC

## 2013-05-28 NOTE — Progress Notes (Signed)
Pts hematoma resolved. Solon Palm assessed pt.  Report called to Allison,R.N. On 3w.  Pt transported to 3W32.

## 2013-05-28 NOTE — Progress Notes (Signed)
   S: Called to see pt 2/2 development of left groin hematoma after ambulating this evening.  He is s/p peripheral anio and REIA and LCIA stenting today.  He is currently w/o complaints.  He is upset about having to stay the night. O:   Filed Vitals:   05/28/13 1852  BP: 172/65  Pulse: 88  Temp:   Resp:   Pleasant, nad, aaox 3.  Vss.  Lungs cta, cor reg, l groin w/o bleeding.  Hematoma is soft.  DP 1+.  A/P: 1. Left Groin Hematoma:  S/p peripheral angio and stenting earlier today.  Given the late hour and need for bedrest, we will observe pt tonight with plan for d/c in the AM.  F/U CBC.  Groin currently stable.  Nicolasa Ducking, NP

## 2013-05-28 NOTE — H&P (View-Only) (Signed)
 HPI The patient is a 67 year old male who was referred by Dr. Hochrein for evaluation and management of PAD. He was hospitalized in December for diverticulitis and bowel microperforation. He had paroxysmal atrial fibrillation during his hospitalization . He had respiratory failure and required intubation. His ejection fraction was 20%. Repeat echocardiogram in January confirmed his cardiomyopathy. Cardiac catheterization showed left main 50% stenosis, LAD 40% stenosis, circumflex 30% followed by distal 40% stenosis, small obtuse marginal 60-70% stenosis and right coronary artery 40-50% stenosis. The EF was 20%.   He has been treated medically for CAD and cardiomyopathy. He was asked to increase his physical activities and exercise to improve his cardiomyopathy and heart failure. However, he has been limited by severe claudication affecting his lower back, buttock and  Thigh. It is significantly worse on the right side. The discomfort starts after walking about 100 yards but it can happen even shorter distance if he walks fast. He quit smoking since his hospitalization. He is not diabetic.  .No Known Allergies  Current Outpatient Prescriptions  Medication Sig Dispense Refill  . atorvastatin (LIPITOR) 40 MG tablet Take 1 tablet (40 mg total) by mouth daily.  90 tablet  3  . carvedilol (COREG) 25 MG tablet Take 1 tablet (25 mg total) by mouth 2 (two) times daily with a meal.  180 tablet  3  . furosemide (LASIX) 20 MG tablet Take 1 tablet (20 mg total) by mouth daily.  90 tablet  3  . losartan (COZAAR) 50 MG tablet Take 1 tablet (50 mg total) by mouth 2 (two) times daily.  60 tablet  1  . spironolactone (ALDACTONE) 25 MG tablet Take 1 tablet (25 mg total) by mouth daily.  90 tablet  3  . aspirin EC 81 MG tablet Take 1 tablet (81 mg total) by mouth daily.       No current facility-administered medications for this visit.    Past Medical History  Diagnosis Date  . ETOH abuse     Quit 1998  .  Tobacco abuse     Quit Dec 2012  . Cardiomyopathy, dilated   . Atrial fibrillation     Past Surgical History  Procedure Laterality Date  . Neck cyst resection    . Vasectomy      ROS:  Significant claudication worse on the right side. Stable dyspnea.otherwise as stated in the HPI and negative for all other systems.  PHYSICAL EXAM BP 132/80  Pulse 78  Ht 5' 4" (1.626 m)  Wt 157 lb (71.215 kg)  BMI 26.94 kg/m2  SpO2 94% Constitutional: He is oriented to person, place, and time. He appears well-developed and well-nourished. No distress.  HENT: No nasal discharge.  Head: Normocephalic and atraumatic.  Eyes: Pupils are equal and round. Right eye exhibits no discharge. Left eye exhibits no discharge.  Neck: Normal range of motion. Neck supple. No JVD present. No thyromegaly present. Faint carotid bruits Cardiovascular: Normal rate, regular rhythm, normal heart sounds and. Exam reveals no gallop and no friction rub. No murmur heard.  Pulmonary/Chest: Effort normal and breath sounds normal. No stridor. No respiratory distress. He has no wheezes. He has no rales. He exhibits no tenderness.  Abdominal: Soft. Bowel sounds are normal. He exhibits no distension. There is no tenderness. There is no rebound and no guarding.  Musculoskeletal: Normal range of motion. He exhibits no edema and no tenderness.  Neurological: He is alert and oriented to person, place, and time. Coordination normal.  Skin:   Skin is warm and dry. No rash noted. He is not diaphoretic. No erythema. No pallor.  Psychiatric: He has a normal mood and affect. His behavior is normal. Judgment and thought content normal.  Vascular: Radial pulses are normal bilaterally. Femoral pulses: Very diminished on both sides with bilateral bruits. Distal pulses are palpable on the left side but not on the right side      ASSESSMENT AND PLAN  

## 2013-05-28 NOTE — CV Procedure (Signed)
PERIPHERAL VASCULAR PROCEDURE  NAME:  Ronnie Harvey   MRN: [id number] DOB:  1946-02-21   ADMIT DATE: 05/28/2013  Performing Cardiologist: Lorine Bears Primary Physician: Rudi Heap, MD Primary Cardiologist:  Dr. Rainbow Lions   Procedures Performed:  Abdominal Aortic Angiogram with Bi-Iliofemoral Runoff  Bilateral iliac angiography.  Bilateral Lower Extremity Angiography   Right external iliac artery self-expanding stent placement  Left common iliac artery self-expanding stent placement   Indication(s):   Severe lifestyle limiting Claudication   Consent: The procedure with Risks/Benefits/Alternatives and Indications was reviewed with the patient .  All questions were answered.  Medications:  Sedation:  1 mg IV Versed, 25 mcg IV Fentanyl  Contrast:  201 mL  Visipaque   Procedural details: The left groin was prepped, draped, and anesthetized with 1% lidocaine. Using modified Seldinger technique, a 4 French sheath was introduced into the left common femoral artery after using a micropuncture needle. This was exchanged into a 5 [place] sheath. A 5 Fr Short Pigtail Catheter was advanced of over a  Versicore wire into the descending Aorta to a level just above the renal arteries. A power injection of 34ml/sec contrast over 1 sec was performed for Abdominal Aortic Angiography.  The catheter was then pulled back to a level just above the Aortic bifurcation, and a second power injection was performed to evaluate the iliac arteries in the RAO and LAO positions  Bilateral lower extremity arterial run off was then performed via power injection of 7 ml / sec contrast for a total of 77 ml.    Interventional Procedure:  The patient was enrolled in the Endomax trial and was randomized to either heparin or bivalirudin. The pigtail catheter was changed over the Versicore wire for A crossover catheter which was then pulled back the aortic bifurcation and the wire was advanced down the  contralateral common iliac artery.  The wire was then advanced to the contralateral common femoral artery, the catheter and sheath were removed. A 7 French 45 cm destination sheath was advanced and placed in the proximal right external iliac artery. The study drug was started. After therapeutic ACT, the lesion in the right external iliac artery was stented with an 8 x 40 mm Smart self-expanding stent. This was post dilated with an 8 x 40 mm balloon to 4 atmospheres with excellent results. The sheath was then pulled back over the dilator and the wire with the tip placed in the left external iliac artery. Attention was made to the stenosis in the left common iliac artery. This was stented with 10 x 40 mm Smart self-expanding stent which was post dilated with a 9 mm balloon. This extended beyond the ostium of the internal iliac artery. Initially, there was a lesion noted in the left external iliac artery. However, this had no systolic gradient and improved with selective angiography. Thus, this was not treated.  The sheath was exchanged into a 7 [place] short sheath. However, there was oozing around the sheath with a small hematoma. The plan was to try closure with a Mynx device. However, there was significant tortuosity in the iliac artery. The sheath was exchanged into an 8 [place] sheath. It will be removed manually.  Hemodynamics:  Central Aortic Pressure / Mean Aortic Pressure: 150/59  Findings:  Abdominal aorta: Normal in size with mild ectasia above the renal arteries with no aneurysm. There is mild diffuse atherosclerosis.  Left renal artery: Patent with minor irregularities.  Right renal artery: 2 renal arteries on  the right side with minor irregularities.  Celiac artery: Appears to be patent.  Superior mesenteric artery: Patent  Right common iliac artery: Normal in size with diffuse 20-30% disease throughout its course.  Right internal iliac artery: Diffuse 90% disease proximally in the  midsegment.  Right external iliac artery: There is 90% stenosis in the midsegment.  Right common femoral artery: 20% calcified disease.  Right profunda femoral artery: Patent with minor irregularities.  Right superficial femoral artery: Diffuse 20-30% disease throughout its course.  Right popliteal artery: Minor irregularities.  Three-vessel runoff below the knee with poor visualization of the anterior tibial artery which might be occluded in the midsegment.  Left common iliac artery:  There is 70% tortuous stenosis.  Left internal iliac artery: Diffuse 70-80% proximal disease.  Left external iliac artery: There is 40-50% calcified stenosis.  Left common femoral artery: Diffuse 20% disease.  Left profunda femoral artery: Minor irregularities.  Left superficial femoral artery:  Diffuse 20-30% disease throughout its course.  Left popliteal artery: Normal  Three-vessel runoff below the knee.  Conclusions: 1. Significant left common iliac artery stenosis and right external iliac artery stenosis. Moderate stenosis in the left external iliac artery. 2. No significant infrainguinal disease. There is mild diffuse disease in the SFA bilaterally. 3. Successful self-expanding stent placement to the right external iliac artery and left common iliac artery.  Recommendations:  Aggressive medical therapy. Dual antiplatelet therapy for at least one month.   Lorine Bears, MD, [facility] 05/28/2013 10:37 AM

## 2013-05-28 NOTE — Interval H&P Note (Signed)
History and Physical Interval Note:  05/28/2013 8:44 AM  [name]  has presented today for surgery, with the diagnosis of pvd  The various methods of treatment have been discussed with the patient and family. After consideration of risks, benefits and other options for treatment, the patient has consented to  Procedure(s): ABDOMINAL AORTAGRAM (N/A) as a surgical intervention .  The patient's history has been reviewed, patient examined, no change in status, stable for surgery.  I have reviewed the patient's chart and labs.  Questions were answered to the patient's satisfaction.     Lorine Bears

## 2013-05-28 NOTE — Progress Notes (Signed)
REPORT TO [facility]

## 2013-05-28 NOTE — Progress Notes (Signed)
UP AND WALKED TO BATHROOM AND BACK TO BED AND 10CM HEMATOMA NOTED AND PRESSURE HELD LEFT GROIN FOR 15 MIN AND GROIN MUCH SOFTER DR ARIDA NOTIFIED AND PA CHRIS BERGE TO SEE CLIENT

## 2013-05-29 ENCOUNTER — Encounter (HOSPITAL_COMMUNITY): Payer: Self-pay | Admitting: Nurse Practitioner

## 2013-05-29 DIAGNOSIS — I428 Other cardiomyopathies: Secondary | ICD-10-CM

## 2013-05-29 DIAGNOSIS — I739 Peripheral vascular disease, unspecified: Secondary | ICD-10-CM

## 2013-05-29 LAB — CBC
HCT: 37.9 % — ABNORMAL LOW (ref 39.0–52.0)
Hemoglobin: 13.3 g/dL (ref 13.0–17.0)
MCHC: 35.1 g/dL (ref 30.0–36.0)

## 2013-05-29 LAB — BASIC METABOLIC PANEL
BUN: 15 mg/dL (ref 6–23)
GFR calc non Af Amer: 82 mL/min — ABNORMAL LOW (ref >90)
Glucose, Bld: 90 mg/dL (ref 70–99)
Potassium: 3.5 mEq/L (ref 3.5–5.1)

## 2013-05-29 NOTE — Progress Notes (Signed)
Patient Name: Ronnie Harvey Date of Encounter: 05/29/2013   Principal Problem:   Hematoma Active Problems:   Peripheral arterial disease   Nonischemic cardiomyopathy   Paroxysmal AF   COPD (chronic obstructive pulmonary disease)   CAD (coronary artery disease)  SUBJECTIVE  Left groin is tender but otw stable.  He hasn't walked yet.  He is eager to go home.  CURRENT MEDS . aspirin EC  81 mg Oral Daily  . atorvastatin  40 mg Oral Daily  . carvedilol  25 mg Oral BID WC  . clopidogrel  75 mg Oral Daily  . furosemide  20 mg Oral Daily  . heparin  5,000 Units Subcutaneous Q8H  . losartan  50 mg Oral BID  . sodium chloride  3 mL Intravenous Q12H  . spironolactone  25 mg Oral Daily    OBJECTIVE  Filed Vitals:   05/28/13 1852 05/28/13 2045 05/29/13 0616 05/29/13 0746  BP: 172/65 175/69 144/69 154/68  Pulse: 88 75 80 78  Temp:  98.1 F (36.7 C) 98.2 F (36.8 C)   TempSrc:  Oral Oral   Resp:      Height:  5\' 4"  (1.626 m)    Weight:  153 lb (69.4 kg) 153 lb 3.2 oz (69.491 kg)   SpO2: 93% 96% 94%     Intake/Output Summary (Last 24 hours) at 05/29/13 0750 Last data filed at 05/29/13 0748  Gross per 24 hour  Intake      0 ml  Output    500 ml  Net   -500 ml   Filed Weights   05/28/13 0628 05/28/13 2045 05/29/13 0616  Weight: 155 lb (70.308 kg) 153 lb (69.4 kg) 153 lb 3.2 oz (69.491 kg)   PHYSICAL EXAM  General: Pleasant, NAD. Neuro: Alert and oriented X 3. Moves all extremities spontaneously. Psych: Normal affect. HEENT:  Normal  Neck: Supple without JVD.  Bruit on right. Lungs:  Resp regular and unlabored, basilar crackles.  Diminished breath sounds bilat, right more so than left. Heart: RRR no s3, s4, or murmurs. Abdomen: Soft, non-tender, non-distended, BS + x 4.  Extremities: No clubbing, cyanosis or edema. DP/PT/Radials 1+ and equal bilaterally.  Left groin mildly ecchymotic, soft, no bleeding.  bilat fem bruits.    Accessory Clinical  Findings  CBC  Recent Labs  05/28/13 0723  05/28/13 2256 05/29/13 0452  WBC 10.6*  < > 12.5* 11.9*  NEUTROABS 7.7  --   --   --   HGB 12.9*  < > 13.8 13.3  HCT 37.7*  < > 38.3* 37.9*  MCV 94.0  < > 92.1 92.7  PLT 189  < > 209 204  < > = values in this interval not displayed. Basic Metabolic Panel  Recent Labs  05/28/13 2256 05/29/13 0452  NA  --  139  K  --  3.5  CL  --  105  CO2  --  23  GLUCOSE  --  90  BUN  --  15  CREATININE 0.95 0.99  CALCIUM  --  8.7   TELE  rsr  Radiology/Studies  No results found.  ASSESSMENT AND PLAN  1.  PVD/Left Groin Hematoma:  S/p PTA/stenting yesterday.  Hematoma resolved.  CBC stable.  Bilat fem bruits, likely 2/2 PVD and noted prior to PTA.  Plan d/c today after ambulation.  Cont asa, plavix.  2.  HTN:  BP elevated overnight.  F/u after am meds and consider adding amlodipine if still elevated.  3.  PAF: in sinus.  4.  HL:  Last LDL 108.  On lipitor.  5.  Tob Abuse:  Says he quit 6 mos ago.  Continued cessation encouraged.  6.  NICM:  Euvolemic.  Cont bb/arb/lasix/spiro.  Signed, Nicolasa Ducking NP  Patient seen, examined. Available data reviewed. Agree with findings, assessment, and plan as outlined by Ward Givens, NP. Agree with plans for d/c home this am. Left groin exam reveals soft left femoral hematoma without marked tenderness to palpation. There is no pulsatile mass. Pt understands instructions for no heavy activity x 1 week. I discussed with him at length.  Tonny Bollman, M.D. 05/29/2013 10:50 AM

## 2013-05-29 NOTE — Progress Notes (Signed)
Patient ambulated in room & hallway.  Femoral site level 1 (soft with small bruising).  Will continue to monitor. [place], Mitzi Hansen

## 2013-05-29 NOTE — Progress Notes (Signed)
DC orders received.  Patient stable with no S/S of distress.  Mediation and discharge information reviewed with patient and patient's wife.  Patient DC home. [place], Mitzi Hansen

## 2013-05-29 NOTE — Discharge Summary (Addendum)
Patient ID: Ronnie Harvey,  MRN: [id number], DOB/AGE: 68-Dec-1947 68 y.o.  Admit date: 05/28/2013 Discharge date: 05/29/2013  Primary Care Provider: Rudi Heap Primary Cardiologist: Shela Commons. Antoine Poche, MD / Judie Petit. Kirke Corin, MD (PV)  Discharge Diagnoses Principal Problem:   L Groin Hematoma  **observed after PTA.  Active Problems:   Peripheral arterial disease  **s/p successful PTA and stenting of the right external iliac artery and left common iliac artery.   Nonischemic cardiomyopathy   Paroxysmal AF   COPD   Nonobstructive CAD  Allergies No Known Allergies  Procedures  Peripheral Angiography and Percutaneous Transluminal Angioplasty and Self-Expanding Stent Placement 9.10.2014  Hemodynamics:             Central Aortic Pressure / Mean Aortic Pressure: 150/59  Findings:  Abdominal aorta: Normal in size with mild ectasia above the renal arteries with no aneurysm. There is mild diffuse atherosclerosis.  Left renal artery: Patent with minor irregularities.  Right renal artery: 2 renal arteries on the right side with minor irregularities.  Celiac artery: Appears to be patent.  Superior mesenteric artery: Patent  Right common iliac artery: Normal in size with diffuse 20-30% disease throughout its course.  Right internal iliac artery: Diffuse 90% disease proximally in the midsegment.  Right external iliac artery: There is 90% stenosis in the midsegment.  **Successfully stented with a  8 x 40 mm Smart self-expanding stent.**   Right common femoral artery: 20% calcified disease.  Right profunda femoral artery: Patent with minor irregularities.  Right superficial femoral artery: Diffuse 20-30% disease throughout its course.  Right popliteal artery: Minor irregularities.  Three-vessel runoff below the knee with poor visualization of the anterior tibial artery which might be occluded in the midsegment.  Left common iliac artery:  There is 70% tortuous stenosis.  **Successfully  stented with a 10 x 40 mm Smart self-expanding stent.**   Left internal iliac artery: Diffuse 70-80% proximal disease.  Left external iliac artery: There is 40-50% calcified stenosis.  Left common femoral artery: Diffuse 20% disease.  Left profunda femoral artery: Minor irregularities.  Left superficial femoral artery:  Diffuse 20-30% disease throughout its course.  Left popliteal artery: Normal  Three-vessel runoff below the knee.  Conclusions: 1. Significant left common iliac artery stenosis and right external iliac artery stenosis. Moderate stenosis in the left external iliac artery. 2. No significant infrainguinal disease. There is mild diffuse disease in the SFA bilaterally. 3. Successful self-expanding stent placement to the right external iliac artery and left common iliac artery.  Recommendations:   Aggressive medical therapy. Dual antiplatelet therapy for at least one month. _____________   History of Present Illness  67 y/o male with a h/o nonobstructive CAD and non-ischemic cardiomyopathy who was recently evaluated in peripheral vascular clinic secondary to severe activity limiting lower back, buttock, and thigh pain/claudincation.  ABI's were performed and found to be abnormal @ 0.69 on the right and 0.83 on teh left.  Decision was made to perform peripheral angiography.  Hospital Course  Pt presented to the peripheral vascular cath lab @ [facility] on 05/28/2013.  He underwent peripheral angiography revealing severe left common iliac and right external iliac disease.  Both areas were successfully treated with self-expanding stents.  Post-procedure, he was monitored in the short stay area with a plan to discharge him to home, however after ambulating, he developed a left groin hematoma at the site of his procedure.  Following manual compression, the hematoma was stabilized and decision was made to observe him overnight.  He has had mild tenderness at the hematoma site but  otherwise has been without complaint and ambulating without difficulty.  He will be discharged home today in good condition.  Discharge Vitals Blood pressure 154/68, pulse 78, temperature 98.2 F (36.8 C), temperature source Oral, resp. rate 16, height 5\' 4"  (1.626 m), weight 153 lb 3.2 oz (69.491 kg), SpO2 94.00%.  Filed Weights   05/28/13 0628 05/28/13 2045 05/29/13 0616  Weight: 155 lb (70.308 kg) 153 lb (69.4 kg) 153 lb 3.2 oz (69.491 kg)   Labs  CBC  Recent Labs  05/28/13 0723  05/28/13 2256 05/29/13 0452  WBC 10.6*  < > 12.5* 11.9*  NEUTROABS 7.7  --   --   --   HGB 12.9*  < > 13.8 13.3  HCT 37.7*  < > 38.3* 37.9*  MCV 94.0  < > 92.1 92.7  PLT 189  < > 209 204  < > = values in this interval not displayed. Basic Metabolic Panel  Recent Labs  05/28/13 2256 05/29/13 0452  NA  --  139  K  --  3.5  CL  --  105  CO2  --  23  GLUCOSE  --  90  BUN  --  15  CREATININE 0.95 0.99  CALCIUM  --  8.7   Disposition  Pt is being discharged home today in good condition.  Follow-up Plans & Appointments      Follow-up Information   Follow up with Dr. Kirke Corin On 06/17/2013. (8:15)    Contact information:   [address] [place]  [phone]      Follow up with [facility] On 06/12/2013. (8:30 AM - for ABI's (leg pressure measurements).)    Contact information:   [address] [place]  [phone]      Discharge Medications    Medication List         aspirin EC 81 MG tablet  Take 1 tablet (81 mg total) by mouth daily.     atorvastatin 40 MG tablet  Commonly known as:  LIPITOR  Take 1 tablet (40 mg total) by mouth daily.     carvedilol 25 MG tablet  Commonly known as:  COREG  Take 1 tablet (25 mg total) by mouth 2 (two) times daily with a meal.     clopidogrel 75 MG tablet  Commonly known as:  PLAVIX  Take 1 tablet (75 mg total) by mouth daily.     furosemide 20 MG tablet  Commonly known as:  LASIX  Take 1 tablet (20 mg total)  by mouth daily.     losartan 50 MG tablet  Commonly known as:  COZAAR  Take 1 tablet (50 mg total) by mouth 2 (two) times daily.     spironolactone 25 MG tablet  Commonly known as:  ALDACTONE  Take 1 tablet (25 mg total) by mouth daily.       Outstanding Labs/Studies  None  Duration of Discharge Encounter   Greater than 30 minutes including physician time.  Signed, Nicolasa Ducking NP 05/29/2013, 8:05 AM   Agree as above. See progress note.   Tonny Bollman 05/30/2013 9:02 AM

## 2013-05-29 NOTE — Progress Notes (Signed)
Utilization review completed.  

## 2013-06-12 ENCOUNTER — Encounter (INDEPENDENT_AMBULATORY_CARE_PROVIDER_SITE_OTHER): Payer: MEDICARE | Admitting: Cardiology

## 2013-06-12 DIAGNOSIS — I739 Peripheral vascular disease, unspecified: Secondary | ICD-10-CM

## 2013-06-17 ENCOUNTER — Encounter: Payer: Self-pay | Admitting: Cardiovascular Disease

## 2013-06-17 ENCOUNTER — Ambulatory Visit (INDEPENDENT_AMBULATORY_CARE_PROVIDER_SITE_OTHER): Payer: MEDICARE | Admitting: Cardiovascular Disease

## 2013-06-17 DIAGNOSIS — I739 Peripheral vascular disease, unspecified: Secondary | ICD-10-CM

## 2013-06-17 MED ORDER — CLOPIDOGREL BISULFATE 75 MG PO TABS: 75.0000 mg | ORAL_TABLET | Freq: Every day | ORAL | Status: DC

## 2013-06-17 NOTE — Patient Instructions (Addendum)
Your physician has requested that you have an aortic iliac arterial duplex in 3 months . This test is an ultrasound of the arteries in the legs . It looks at arterial blood flow in the legs and arms. Allow one hour for Lower and Upper Arterial scans. There are no restrictions or special instructions  Your physician recommends that you schedule a follow-up appointment in: 1 week post duplex with Dr Kirke Corin

## 2013-06-17 NOTE — Assessment & Plan Note (Signed)
He is doing well after recent stenting to the right external iliac artery and left common iliac artery with normalization of ABI bilaterally. There is a moderate left external iliac artery stenosis which was left to be treated medically. Continue dual antiplatelet therapy for a minimum of one month but I will treat him for at least 3 months with this. Obtain aortoiliac duplex in 3 months from now. I advised him to start a walking program.

## 2013-06-17 NOTE — Progress Notes (Signed)
Primary Cardiologist: Dr. Antoine Poche PCP: Dr. Christell Constant  HPI The patient is a 67 year old male who is here for a follow up visit regarding  PAD. He was hospitalized in December, 2013 for diverticulitis and bowel microperforation. He had paroxysmal atrial fibrillation during his hospitalization . He had respiratory failure and required intubation. His ejection fraction was 20%. Repeat echocardiogram in January confirmed his cardiomyopathy. Cardiac catheterization showed left main 50% stenosis, LAD 40% stenosis, circumflex 30% followed by distal 40% stenosis, small obtuse marginal 60-70% stenosis and right coronary artery 40-50% stenosis. The EF was 20%.   He has been treated medically for CAD and cardiomyopathy. He was asked to increase his physical activities and exercise to improve his cardiomyopathy and heart failure. However, he was limited by severe claudication affecting his lower back, buttock and  Thigh with significant decrease in ABI (R>L) with evidence of inflow disease.  I proceeded with angiography which showed severe right external iliac artery stenosis and left common iliac artery stenosis with moderate left external iliac artery stenosis. I performed successful self-expanding stent placement to both right external iliac artery and left common iliac artery. He was enrolled in the Endomax trial. He had a small right groin hematoma after bedrest was over. He was observed overnight with no complications. Followup post procedure ABI is normal bilaterally. He feels overall better with resolution of right leg claudication. He continues to have mild back discomfort. He has not started exercising yet.   Marland KitchenNo Known Allergies  Current Outpatient Prescriptions  Medication Sig Dispense Refill  . aspirin EC 81 MG tablet Take 1 tablet (81 mg total) by mouth daily.      [name] atorvastatin (LIPITOR) 40 MG tablet Take 1 tablet (40 mg total) by mouth daily.  90 tablet  3  . carvedilol (COREG) 25 MG tablet Take 1  tablet (25 mg total) by mouth 2 (two) times daily with a meal.  180 tablet  3  . clopidogrel (PLAVIX) 75 MG tablet Take 1 tablet (75 mg total) by mouth daily.  30 tablet  3  . furosemide (LASIX) 20 MG tablet Take 1 tablet (20 mg total) by mouth daily.  90 tablet  3  . losartan (COZAAR) 50 MG tablet Take 1 tablet (50 mg total) by mouth 2 (two) times daily.  60 tablet  1  . spironolactone (ALDACTONE) 25 MG tablet Take 1 tablet (25 mg total) by mouth daily.  90 tablet  3   No current facility-administered medications for this visit.    Past Medical History  Diagnosis Date  . ETOH abuse     Quit 1998  . Tobacco abuse     Quit Dec 2012  . Cardiomyopathy, dilated     a. 09/2012 Echo: EF 15-20%, diff HK, gr2 DD, Triv AI, Mod MR, mild TR/PR, mod-sev dil LA.  [name] PAF (paroxysmal atrial fibrillation)   . PVD (peripheral vascular disease)     a. 05/2013 s/p PTA/self-expanding stenting of the REIA and LCIA.  [name] CAD (coronary artery disease) nonobstructive    a. 10/2012 Cath: LM 50, LAD 25p, 63m, Diag 42m, LCX 30p, 40d, OM1 60-70, OM3 30, RCA 40-69m, AM 70p, PDA nl, EF 20%.    Past Surgical History  Procedure Laterality Date  . Neck cyst resection    . Vasectomy      ROS:  Significant claudication worse on the right side. Stable dyspnea.otherwise as stated in the HPI and negative for all other systems.  PHYSICAL EXAM There were  no vitals taken for this visit. Constitutional: He is oriented to person, place, and time. He appears well-developed and well-nourished. No distress.  HENT: No nasal discharge.  Head: Normocephalic and atraumatic.  Eyes: Pupils are equal and round. Right eye exhibits no discharge. Left eye exhibits no discharge.  Neck: Normal range of motion. Neck supple. No JVD present. No thyromegaly present. Faint carotid bruits Cardiovascular: Normal rate, regular rhythm, normal heart sounds and. Exam reveals no gallop and no friction rub. No murmur heard.  Pulmonary/Chest: Effort  normal and breath sounds normal. No stridor. No respiratory distress. He has no wheezes. He has no rales. He exhibits no tenderness.  Abdominal: Soft. Bowel sounds are normal. He exhibits no distension. There is no tenderness. There is no rebound and no guarding.  Musculoskeletal: Normal range of motion. He exhibits no edema and no tenderness.  Neurological: He is alert and oriented to person, place, and time. Coordination normal.  Skin: Skin is warm and dry. No rash noted. He is not diaphoretic. No erythema. No pallor.  Psychiatric: He has a normal mood and affect. His behavior is normal. Judgment and thought content normal.  Vascular: Radial pulses are normal bilaterally. Femoral pulses: Normal on the right side and slightly diminished on the left side. Distal pulses are palpable bilaterally. No groin hematoma. There is superficial bruising extending to the lower thigh.    ASSESSMENT AND PLAN

## 2013-08-28 ENCOUNTER — Ambulatory Visit (HOSPITAL_COMMUNITY): Payer: MEDICARE | Attending: Cardiovascular Disease

## 2013-08-28 ENCOUNTER — Encounter (HOSPITAL_COMMUNITY): Payer: MEDICARE

## 2013-08-28 ENCOUNTER — Ambulatory Visit (HOSPITAL_BASED_OUTPATIENT_CLINIC_OR_DEPARTMENT_OTHER): Payer: MEDICARE | Admitting: Cardiology

## 2013-08-28 DIAGNOSIS — I739 Peripheral vascular disease, unspecified: Secondary | ICD-10-CM

## 2013-08-28 DIAGNOSIS — J4489 Other specified chronic obstructive pulmonary disease: Secondary | ICD-10-CM | POA: Insufficient documentation

## 2013-08-28 DIAGNOSIS — I6529 Occlusion and stenosis of unspecified carotid artery: Secondary | ICD-10-CM | POA: Insufficient documentation

## 2013-08-28 DIAGNOSIS — I700 Atherosclerosis of aorta: Secondary | ICD-10-CM

## 2013-08-28 DIAGNOSIS — Z87891 Personal history of nicotine dependence: Secondary | ICD-10-CM | POA: Insufficient documentation

## 2013-08-28 DIAGNOSIS — E785 Hyperlipidemia, unspecified: Secondary | ICD-10-CM | POA: Insufficient documentation

## 2013-08-28 DIAGNOSIS — J449 Chronic obstructive pulmonary disease, unspecified: Secondary | ICD-10-CM | POA: Insufficient documentation

## 2013-08-28 DIAGNOSIS — I2510 Atherosclerotic heart disease of native coronary artery without angina pectoris: Secondary | ICD-10-CM | POA: Insufficient documentation

## 2013-08-28 DIAGNOSIS — I658 Occlusion and stenosis of other precerebral arteries: Secondary | ICD-10-CM | POA: Insufficient documentation

## 2013-08-29 NOTE — Progress Notes (Signed)
Patient ID: Ronnie Harvey, male   DOB: [birth year], 67 y.o.   MRN: [id number] Entered in error This encounter was created in error - please disregard.

## 2013-09-02 ENCOUNTER — Encounter: Payer: Self-pay | Admitting: Cardiovascular Disease

## 2013-09-02 ENCOUNTER — Ambulatory Visit (INDEPENDENT_AMBULATORY_CARE_PROVIDER_SITE_OTHER): Payer: MEDICARE | Admitting: Cardiovascular Disease

## 2013-09-02 VITALS — BP 146/67 | HR 65 | Wt 160.0 lb

## 2013-09-02 DIAGNOSIS — Z0000 Encounter for general adult medical examination without abnormal findings: Secondary | ICD-10-CM

## 2013-09-02 DIAGNOSIS — I2510 Atherosclerotic heart disease of native coronary artery without angina pectoris: Secondary | ICD-10-CM

## 2013-09-02 DIAGNOSIS — E785 Hyperlipidemia, unspecified: Secondary | ICD-10-CM

## 2013-09-02 DIAGNOSIS — I739 Peripheral vascular disease, unspecified: Secondary | ICD-10-CM

## 2013-09-02 LAB — LIPID PANEL
HDL: 30 mg/dL — ABNORMAL LOW (ref >39.00)
Total CHOL/HDL Ratio: 4
Triglycerides: 206 mg/dL — ABNORMAL HIGH (ref 0.0–149.0)
VLDL: 41.2 mg/dL — ABNORMAL HIGH (ref 0.0–40.0)

## 2013-09-02 LAB — HEPATIC FUNCTION PANEL: Total Bilirubin: 0.9 mg/dL (ref 0.3–1.2)

## 2013-09-02 NOTE — Progress Notes (Signed)
Primary Cardiologist: Dr. Antoine Poche PCP: Dr. Christell Constant  HPI The patient is a 67 year old male who is here for a follow up visit regarding  PAD. He was hospitalized in December, 2013 for diverticulitis and bowel microperforation. He had paroxysmal atrial fibrillation during his hospitalization . He had respiratory failure and required intubation. His ejection fraction was 20%. Repeat echocardiogram in January confirmed his cardiomyopathy. Cardiac catheterization showed left main 50% stenosis, LAD 40% stenosis, circumflex 30% followed by distal 40% stenosis, small obtuse marginal 60-70% stenosis and right coronary artery 40-50% stenosis. The EF was 20%.   He has been treated medically for CAD and cardiomyopathy.  He was seen for severe claudication affecting his lower back, buttock and  Thigh with significant decrease in ABI (R>L) with evidence of inflow disease.  I proceeded with angiography in September of 2014 which showed severe right external iliac artery stenosis and left common iliac artery stenosis with moderate left external iliac artery stenosis. I performed successful self-expanding stent placement to both right external iliac artery and left common iliac artery.  He feels overall better with resolution of  claudication. He continues to have mild back discomfort.   .No Known Allergies  Current Outpatient Prescriptions  Medication Sig Dispense Refill  . aspirin EC 81 MG tablet Take 1 tablet (81 mg total) by mouth daily.      [name] atorvastatin (LIPITOR) 40 MG tablet Take 1 tablet (40 mg total) by mouth daily.  90 tablet  3  . carvedilol (COREG) 25 MG tablet Take 1 tablet (25 mg total) by mouth 2 (two) times daily with a meal.  180 tablet  3  . furosemide (LASIX) 20 MG tablet Take 1 tablet (20 mg total) by mouth daily.  90 tablet  3  . losartan (COZAAR) 50 MG tablet Take 1 tablet (50 mg total) by mouth 2 (two) times daily.  60 tablet  1  . spironolactone (ALDACTONE) 25 MG tablet Take 1 tablet (25  mg total) by mouth daily.  90 tablet  3   No current facility-administered medications for this visit.    Past Medical History  Diagnosis Date  . ETOH abuse     Quit 1998  . Tobacco abuse     Quit Dec 2012  . Cardiomyopathy, dilated     a. 09/2012 Echo: EF 15-20%, diff HK, gr2 DD, Triv AI, Mod MR, mild TR/PR, mod-sev dil LA.  [name] PAF (paroxysmal atrial fibrillation)   . PVD (peripheral vascular disease)     a. 05/2013 s/p PTA/self-expanding stenting of the REIA and LCIA.  [name] CAD (coronary artery disease) nonobstructive    a. 10/2012 Cath: LM 50, LAD 25p, 25m, Diag 21m, LCX 30p, 40d, OM1 60-70, OM3 30, RCA 40-74m, AM 70p, PDA nl, EF 20%.    Past Surgical History  Procedure Laterality Date  . Neck cyst resection    . Vasectomy        PHYSICAL EXAM BP 146/67  Pulse 65  Wt 160 lb (72.576 kg) Constitutional: He is oriented to person, place, and time. He appears well-developed and well-nourished. No distress.  HENT: No nasal discharge.  Head: Normocephalic and atraumatic.  Eyes: Pupils are equal and round. Right eye exhibits no discharge. Left eye exhibits no discharge.  Neck: Normal range of motion. Neck supple. No JVD present. No thyromegaly present. Faint carotid bruits Cardiovascular: Normal rate, regular rhythm, normal heart sounds and. Exam reveals no gallop and no friction rub. No murmur heard.  Pulmonary/Chest: Effort  normal and breath sounds normal. No stridor. No respiratory distress. He has no wheezes. He has no rales. He exhibits no tenderness.  Abdominal: Soft. Bowel sounds are normal. He exhibits no distension. There is no tenderness. There is no rebound and no guarding.  Musculoskeletal: Normal range of motion. He exhibits no edema and no tenderness.  Neurological: He is alert and oriented to person, place, and time. Coordination normal.  Skin: Skin is warm and dry. No rash noted. He is not diaphoretic. No erythema. No pallor.  Psychiatric: He has a normal mood and  affect. His behavior is normal. Judgment and thought content normal.  Vascular: Radial pulses are normal bilaterally. Femoral pulses: Normal bilaterally. Distal pulses are palpable bilaterally.   ASSESSMENT AND PLAN

## 2013-09-02 NOTE — Assessment & Plan Note (Addendum)
Been doing well with no significant claudication. Recent ABI was normal. Duplex showed patent stents with mild stenosis in the right common iliac artery and mild stenosis in the left external iliac artery. Continue medical therapy. Plavix can be stopped after he finishes current supply. Followup aortoiliac duplex an ABI in 6 months.

## 2013-09-02 NOTE — Patient Instructions (Addendum)
Your physician has recommended you make the following change in your medication:   1. Finish up the Plavix that you have at home then Stop  Your physician recommends that you have lab work today: [facility]  Your physician has requested that you have a aorta iliac duplex.   Your physician has requested that you have an ankle brachial index (ABI) in 6 months . During this test an ultrasound and blood pressure cuff are used to evaluate the arteries that supply the arms and legs with blood. Allow thirty minutes for this exam. There are no restrictions or special instructions.     Your physician recommends that you schedule a follow-up appointment in: 6 months with Dr. Kirke Corin same day as test

## 2013-09-02 NOTE — Assessment & Plan Note (Signed)
He is currently not atorvastatin 40 mg daily and due for fasting lipid and liver profile which was requested for today.

## 2013-09-02 NOTE — Assessment & Plan Note (Signed)
He reports no symptoms of angina. Continue medical therapy. 

## 2013-09-03 LAB — LDL CHOLESTEROL, DIRECT: Direct LDL: 74.9 mg/dL

## 2013-09-05 ENCOUNTER — Encounter: Payer: Self-pay | Admitting: Cardiovascular Disease

## 2013-09-05 NOTE — Telephone Encounter (Signed)
New follow up       Pt returning your call for his lab results.

## 2013-09-05 NOTE — Telephone Encounter (Signed)
This encounter was created in error - please disregard.

## 2013-09-23 NOTE — Progress Notes (Signed)
This encounter was created in error - please disregard.

## 2013-10-08 ENCOUNTER — Other Ambulatory Visit: Payer: Self-pay | Admitting: Cardiology

## 2013-11-05 ENCOUNTER — Ambulatory Visit: Payer: MEDICARE | Admitting: Cardiology

## 2013-12-03 ENCOUNTER — Ambulatory Visit (INDEPENDENT_AMBULATORY_CARE_PROVIDER_SITE_OTHER): Payer: MEDICARE | Admitting: Cardiology

## 2013-12-03 ENCOUNTER — Encounter: Payer: Self-pay | Admitting: Cardiology

## 2013-12-03 VITALS — BP 173/72 | HR 65 | Ht 64.0 in | Wt 163.0 lb

## 2013-12-03 DIAGNOSIS — Z79899 Other long term (current) drug therapy: Secondary | ICD-10-CM

## 2013-12-03 DIAGNOSIS — I429 Cardiomyopathy, unspecified: Secondary | ICD-10-CM

## 2013-12-03 DIAGNOSIS — I428 Other cardiomyopathies: Secondary | ICD-10-CM

## 2013-12-03 DIAGNOSIS — I4891 Unspecified atrial fibrillation: Secondary | ICD-10-CM

## 2013-12-03 DIAGNOSIS — I6529 Occlusion and stenosis of unspecified carotid artery: Secondary | ICD-10-CM

## 2013-12-03 MED ORDER — SPIRONOLACTONE 50 MG PO TABS: 50.0000 mg | ORAL_TABLET | Freq: Every day | ORAL | Status: DC

## 2013-12-03 NOTE — Progress Notes (Signed)
HPI The patient presents for followup after a hospitalization in December. He was seen in consultation by us at the time of diverticulitis and bowel microperforation. He had atrial fibrillation paroxysmally in as part of this hospitalization. He had respiratory failure and required intubation. His ejection fraction was 20%.  He was not anticoagulated at that time because of his ongoing acute issues. I did repeat an echocardiogram in January confirming his cardiomyopathy. I subsequently did a cardiac catheterization which demonstrated left main 50% stenosis, LAD 40% stenosis, circumflex 30% followed by distal 40% stenosis, small obtuse marginal 60-70% stenosis and right coronary artery 40-50% stenosis. The EF was 20%.    Since I last saw him he has done well. He did have PTA and stenting of his right iliac and left common iliac by Dr. Kirke CorinArida.  He said he had improvement in his leg fatigue with this. He is walking everyday. He does have some dyspnea on exertion but this is baseline. He denies any new shortness of breath, PND or orthopnea. He has not palpitations, presyncope or syncope. He's not had any chest discomfort.  .No Known Allergies  Current Outpatient Prescriptions  Medication Sig Dispense Refill  . aspirin EC 81 MG tablet Take 1 tablet (81 mg total) by mouth daily.      [name]. atorvastatin (LIPITOR) 40 MG tablet Take 1 tablet (40 mg total) by mouth daily.  90 tablet  3  . carvedilol (COREG) 25 MG tablet Take 1 tablet (25 mg total) by mouth 2 (two) times daily with a meal.  180 tablet  3  . furosemide (LASIX) 20 MG tablet TAKE 1 TABLET DAILY  90 tablet  0  . losartan (COZAAR) 50 MG tablet TAKE 1 TABLET TWICE A DAY  180 tablet  0  . spironolactone (ALDACTONE) 25 MG tablet Take 1 tablet (25 mg total) by mouth daily.  90 tablet  3   No current facility-administered medications for this visit.    Past Medical History  Diagnosis Date  . ETOH abuse     Quit 1998  . Tobacco abuse     Quit Dec  2012  . Cardiomyopathy, dilated     a. 09/2012 Echo: EF 15-20%, diff HK, gr2 DD, Triv AI, Mod MR, mild TR/PR, mod-sev dil LA.  [name]. PAF (paroxysmal atrial fibrillation)   . PVD (peripheral vascular disease)     a. 05/2013 s/p PTA/self-expanding stenting of the REIA and LCIA.  [name]. CAD (coronary artery disease) nonobstructive    a. 10/2012 Cath: LM 50, LAD 25p, 4138m, Diag 7881m, LCX 30p, 40d, OM1 60-70, OM3 30, RCA 40-2247m, AM 70p, PDA nl, EF 20%.    Past Surgical History  Procedure Laterality Date  . Neck cyst resection    . Vasectomy      ROS:  As stated in the HPI and negative for all other systems.  PHYSICAL EXAM BP 173/72  Pulse 65  Ht 5\' 4"  (1.626 m)  Wt 163 lb (73.936 kg)  BMI 27.97 kg/m2 GENERAL:  Well appearing HEENT: PERRL NECK:  No jugular venous distention, waveform within normal limits, carotid upstroke brisk and symmetric, subtle carotid bilateral bruits, no thyromegaly LUNGS:  Clear to auscultation bilaterally HEART:  PMI not displaced or sustained,S1 and S2 within normal limits, no S3, no S4, no clicks, no rubs, no murmurs ABD:  Flat, positive bowel sounds normal in frequency in pitch, positive midline soft bruits, no rebound, no guarding, no midline pulsatile mass, no hepatomegaly, no splenomegaly EXT:  2 plus pulses upper, diminished bilateral lower, left femoral bruit no edema, no cyanosis no clubbing NEURO:  Nonfocal  EKG:  Sinus rhythm, rate 56, axis within normal limits, minimal will criteria for left ventricular hypertrophy with nonspecific ST-T wave changes.. 12/03/2013  ASSESSMENT AND PLAN  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 He has really class I symptoms. No further medication is indicated. I will followup future echocardiograms with the next one in June will.  Atrial fibrillation He has had no recurrent dysrhythmias since stopping this.  CAD This is nonobstructive.  He will continue to be managed with risk reduction.  HTN His BP is still elevated.  I will  increase his spironolactone to 50 mg daily and check his BMET in 2 and six weeks.  Of note he had a recent low normal K.   CAROTID BRUIT He has 40-59% right stenosis and 60-79% left stenosis and will have followup in June when he is down in [place] seeing Dr. Kirke Corin.  PVD He has had symptom relief.   We will continue with risk reduction.

## 2013-12-03 NOTE — Patient Instructions (Signed)
Please increase spironolactone to 50 mg a day. Continue all other medications as listed.  Please have blood work at [facility] in 2 weeks and again in 1 month.  Your physician has requested that you have a carotid duplex. This test is an ultrasound of the carotid arteries in your neck. It looks at blood flow through these arteries that supply the brain with blood. Allow one hour for this exam. There are no restrictions or special instructions.  Your physician has requested that you have an echocardiogram. Echocardiography is a painless test that uses sound waves to create images of your heart. It provides your doctor with information about the size and shape of your heart and how well your heart's chambers and valves are working. This procedure takes approximately one hour. There are no restrictions for this procedure.  Follow up in 4 months with Dr Antoine Poche.  You will receive a letter in the mail 2 months before you are due.  Please call us when you receive this letter to schedule your follow up appointment.

## 2013-12-17 ENCOUNTER — Other Ambulatory Visit (INDEPENDENT_AMBULATORY_CARE_PROVIDER_SITE_OTHER): Payer: Medicare Other

## 2013-12-17 DIAGNOSIS — I4891 Unspecified atrial fibrillation: Secondary | ICD-10-CM

## 2013-12-17 DIAGNOSIS — I429 Cardiomyopathy, unspecified: Secondary | ICD-10-CM

## 2013-12-17 DIAGNOSIS — I6529 Occlusion and stenosis of unspecified carotid artery: Secondary | ICD-10-CM

## 2013-12-17 DIAGNOSIS — Z09 Encounter for follow-up examination after completed treatment for conditions other than malignant neoplasm: Secondary | ICD-10-CM

## 2013-12-17 NOTE — Progress Notes (Signed)
Patient came in with orders from dr.Hochrein

## 2013-12-18 LAB — HEPATIC FUNCTION PANEL
ALK PHOS: 119 IU/L — AB (ref 39–117)
ALT: 12 IU/L (ref 0–44)
AST: 17 IU/L (ref 0–40)
Albumin: 4.6 g/dL (ref 3.6–4.8)
Bilirubin, Direct: 0.13 mg/dL (ref 0.00–0.40)
Total Bilirubin: 0.4 mg/dL (ref 0.0–1.2)
Total Protein: 7.7 g/dL (ref 6.0–8.5)

## 2013-12-18 LAB — LIPID PANEL
CHOL/HDL RATIO: 4.7 ratio (ref 0.0–5.0)
CHOLESTEROL TOTAL: 140 mg/dL (ref 100–199)
HDL: 30 mg/dL — ABNORMAL LOW (ref >39)
LDL Calculated: 71 mg/dL (ref 0–99)
TRIGLYCERIDES: 194 mg/dL — AB (ref 0–149)
VLDL Cholesterol Cal: 39 mg/dL (ref 5–40)

## 2013-12-18 LAB — BMP8+EGFR
BUN/Creatinine Ratio: 17 (ref 10–22)
BUN: 21 mg/dL (ref 8–27)
CALCIUM: 9.7 mg/dL (ref 8.6–10.2)
CO2: 24 mmol/L (ref 18–29)
CREATININE: 1.26 mg/dL (ref 0.76–1.27)
Chloride: 99 mmol/L (ref 97–108)
GFR, EST AFRICAN AMERICAN: 67 mL/min/{1.73_m2} (ref >59)
GFR, EST NON AFRICAN AMERICAN: 58 mL/min/{1.73_m2} — AB (ref >59)
Glucose: 101 mg/dL — ABNORMAL HIGH (ref 65–99)
Potassium: 4.9 mmol/L (ref 3.5–5.2)
SODIUM: 141 mmol/L (ref 134–144)

## 2013-12-31 ENCOUNTER — Other Ambulatory Visit (INDEPENDENT_AMBULATORY_CARE_PROVIDER_SITE_OTHER): Payer: Medicare Other

## 2013-12-31 DIAGNOSIS — I429 Cardiomyopathy, unspecified: Secondary | ICD-10-CM

## 2013-12-31 DIAGNOSIS — E7800 Pure hypercholesterolemia, unspecified: Secondary | ICD-10-CM

## 2013-12-31 DIAGNOSIS — Z79899 Other long term (current) drug therapy: Secondary | ICD-10-CM

## 2013-12-31 DIAGNOSIS — I428 Other cardiomyopathies: Secondary | ICD-10-CM

## 2013-12-31 NOTE — Addendum Note (Signed)
Addended by: Prescott Gum on: 12/31/2013 10:47 AM   Modules accepted: Orders

## 2013-12-31 NOTE — Progress Notes (Signed)
Pt came in for  Lab only

## 2014-01-01 ENCOUNTER — Other Ambulatory Visit: Payer: Self-pay | Admitting: Cardiology

## 2014-01-01 LAB — BMP8+EGFR
BUN/Creatinine Ratio: 23 — ABNORMAL HIGH (ref 10–22)
BUN: 33 mg/dL — ABNORMAL HIGH (ref 8–27)
CALCIUM: 9.8 mg/dL (ref 8.6–10.2)
CO2: 23 mmol/L (ref 18–29)
CREATININE: 1.45 mg/dL — AB (ref 0.76–1.27)
Chloride: 100 mmol/L (ref 97–108)
GFR, EST AFRICAN AMERICAN: 56 mL/min/{1.73_m2} — AB (ref >59)
GFR, EST NON AFRICAN AMERICAN: 49 mL/min/{1.73_m2} — AB (ref >59)
Glucose: 100 mg/dL — ABNORMAL HIGH (ref 65–99)
Potassium: 4.7 mmol/L (ref 3.5–5.2)
SODIUM: 139 mmol/L (ref 134–144)

## 2014-01-01 LAB — HEPATIC FUNCTION PANEL
ALT: 10 IU/L (ref 0–44)
AST: 14 IU/L (ref 0–40)
Albumin: 4.4 g/dL (ref 3.6–4.8)
Alkaline Phosphatase: 102 IU/L (ref 39–117)
BILIRUBIN DIRECT: 0.14 mg/dL (ref 0.00–0.40)
BILIRUBIN TOTAL: 0.5 mg/dL (ref 0.0–1.2)
TOTAL PROTEIN: 7.2 g/dL (ref 6.0–8.5)

## 2014-01-01 LAB — LIPID PANEL
Chol/HDL Ratio: 5.1 ratio units — ABNORMAL HIGH (ref 0.0–5.0)
Cholesterol, Total: 147 mg/dL (ref 100–199)
HDL: 29 mg/dL — ABNORMAL LOW (ref >39)
LDL CALC: 77 mg/dL (ref 0–99)
Triglycerides: 205 mg/dL — ABNORMAL HIGH (ref 0–149)
VLDL CHOLESTEROL CAL: 41 mg/dL — AB (ref 5–40)

## 2014-01-01 NOTE — Progress Notes (Signed)
Quick Note:  Preliminary report reviewed by triage nurse and sent to MD desk. ______ 

## 2014-01-06 ENCOUNTER — Other Ambulatory Visit: Payer: Self-pay | Admitting: *Deleted

## 2014-01-06 DIAGNOSIS — N289 Disorder of kidney and ureter, unspecified: Secondary | ICD-10-CM

## 2014-01-14 ENCOUNTER — Telehealth: Payer: Self-pay

## 2014-01-14 NOTE — Telephone Encounter (Signed)
Request from [facility] , sent to [employer] on 01/13/2014 .

## 2014-01-15 ENCOUNTER — Encounter (INDEPENDENT_AMBULATORY_CARE_PROVIDER_SITE_OTHER): Payer: BC Managed Care – PPO | Admitting: Ophthalmology

## 2014-01-15 DIAGNOSIS — H35379 Puckering of macula, unspecified eye: Secondary | ICD-10-CM

## 2014-01-15 DIAGNOSIS — H43819 Vitreous degeneration, unspecified eye: Secondary | ICD-10-CM

## 2014-01-15 DIAGNOSIS — H35039 Hypertensive retinopathy, unspecified eye: Secondary | ICD-10-CM

## 2014-01-15 DIAGNOSIS — I10 Essential (primary) hypertension: Secondary | ICD-10-CM

## 2014-02-17 ENCOUNTER — Ambulatory Visit (HOSPITAL_COMMUNITY): Payer: MEDICARE | Attending: Cardiovascular Disease | Admitting: Cardiology

## 2014-02-17 ENCOUNTER — Ambulatory Visit (HOSPITAL_BASED_OUTPATIENT_CLINIC_OR_DEPARTMENT_OTHER): Payer: MEDICARE | Admitting: Cardiology

## 2014-02-17 ENCOUNTER — Ambulatory Visit (INDEPENDENT_AMBULATORY_CARE_PROVIDER_SITE_OTHER): Payer: MEDICARE | Admitting: Cardiology

## 2014-02-17 ENCOUNTER — Ambulatory Visit (INDEPENDENT_AMBULATORY_CARE_PROVIDER_SITE_OTHER): Payer: MEDICARE | Admitting: *Deleted

## 2014-02-17 DIAGNOSIS — I700 Atherosclerosis of aorta: Secondary | ICD-10-CM | POA: Insufficient documentation

## 2014-02-17 DIAGNOSIS — I429 Cardiomyopathy, unspecified: Secondary | ICD-10-CM

## 2014-02-17 DIAGNOSIS — I739 Peripheral vascular disease, unspecified: Secondary | ICD-10-CM

## 2014-02-17 DIAGNOSIS — I428 Other cardiomyopathies: Secondary | ICD-10-CM

## 2014-02-17 DIAGNOSIS — N289 Disorder of kidney and ureter, unspecified: Secondary | ICD-10-CM

## 2014-02-17 DIAGNOSIS — Z79899 Other long term (current) drug therapy: Secondary | ICD-10-CM

## 2014-02-17 DIAGNOSIS — I6529 Occlusion and stenosis of unspecified carotid artery: Secondary | ICD-10-CM

## 2014-02-17 LAB — BASIC METABOLIC PANEL
BUN: 30 mg/dL — AB (ref 6–23)
CHLORIDE: 105 meq/L (ref 96–112)
CO2: 26 meq/L (ref 19–32)
CREATININE: 1.4 mg/dL (ref 0.4–1.5)
Calcium: 9.5 mg/dL (ref 8.4–10.5)
GFR: 53.38 mL/min — ABNORMAL LOW (ref >60.00)
Glucose, Bld: 111 mg/dL — ABNORMAL HIGH (ref 70–99)
POTASSIUM: 4 meq/L (ref 3.5–5.1)
Sodium: 139 mEq/L (ref 135–145)

## 2014-02-17 NOTE — Progress Notes (Signed)
Abdominal aorta duplex complete 

## 2014-02-17 NOTE — Progress Notes (Signed)
Carotid duplex complete 

## 2014-02-17 NOTE — Progress Notes (Signed)
ABI complete 

## 2014-02-24 ENCOUNTER — Encounter (HOSPITAL_COMMUNITY): Payer: MEDICARE

## 2014-02-24 ENCOUNTER — Encounter: Payer: Self-pay | Admitting: Cardiovascular Disease

## 2014-02-24 ENCOUNTER — Ambulatory Visit (HOSPITAL_COMMUNITY): Payer: MEDICARE | Attending: Cardiovascular Disease | Admitting: Radiology

## 2014-02-24 ENCOUNTER — Ambulatory Visit (INDEPENDENT_AMBULATORY_CARE_PROVIDER_SITE_OTHER): Payer: MEDICARE | Admitting: Cardiovascular Disease

## 2014-02-24 VITALS — BP 138/72 | HR 71 | Ht 64.0 in | Wt 158.2 lb

## 2014-02-24 DIAGNOSIS — I739 Peripheral vascular disease, unspecified: Secondary | ICD-10-CM

## 2014-02-24 DIAGNOSIS — I428 Other cardiomyopathies: Secondary | ICD-10-CM | POA: Insufficient documentation

## 2014-02-24 DIAGNOSIS — I429 Cardiomyopathy, unspecified: Secondary | ICD-10-CM

## 2014-02-24 DIAGNOSIS — I4891 Unspecified atrial fibrillation: Secondary | ICD-10-CM

## 2014-02-24 DIAGNOSIS — I2510 Atherosclerotic heart disease of native coronary artery without angina pectoris: Secondary | ICD-10-CM

## 2014-02-24 NOTE — Patient Instructions (Signed)
Your physician has recommended that you have an aorto-iliac duplex in 6 MONTHS.  Your physician has requested that you have an ankle brachial index (ABI) in 6 MONTHS. During this test an ultrasound and blood pressure cuff are used to evaluate the arteries that supply the arms and legs with blood. Allow thirty minutes for this exam. There are no restrictions or special instructions.  Your physician wants you to follow-up in: 6 MONTHS with Dr Kirke Corin. You will receive a reminder letter in the mail two months in advance. If you don't receive a letter, please call our office to schedule the follow-up appointment.

## 2014-02-24 NOTE — Assessment & Plan Note (Addendum)
He has been doing well with mild claudication. Recent ABI was normal. Duplex showed patent stents with moderate stenosis in the right common iliac artery and moderate stenosis in the left external iliac artery. These were not then stented segments. Continue medical therapy.  Followup aortoiliac duplex an ABI in 6 months.

## 2014-02-24 NOTE — Assessment & Plan Note (Signed)
He seems to be euvolemic.

## 2014-02-24 NOTE — Progress Notes (Signed)
Echocardiogram performed.  

## 2014-02-24 NOTE — Assessment & Plan Note (Signed)
No chest pain. Continue medical therapy. 

## 2014-02-24 NOTE — Progress Notes (Signed)
Primary Cardiologist: Dr. Antoine Poche PCP: Dr. Christell Constant  HPI The patient is a 68 year old male who is here for a follow up visit regarding  PAD. He has no history of nonobstructive coronary artery disease and nonischemic cardiomyopathy.   He was seen for severe claudication affecting his lower back, buttock and  Thigh with significant decrease in ABI (R>L) with evidence of inflow disease.  I proceeded with angiography in September of 2014 which showed severe right external iliac artery stenosis and left common iliac artery stenosis with moderate left external iliac artery stenosis. I performed successful self-expanding stent placement to both right external iliac artery and left common iliac artery.  He feels overall better with resolution of  claudication. He continues to have mild back discomfort but is definitely able to walk more.   .No Known Allergies  Current Outpatient Prescriptions  Medication Sig Dispense Refill  . aspirin EC 81 MG tablet Take 1 tablet (81 mg total) by mouth daily.      [name] atorvastatin (LIPITOR) 40 MG tablet Take 1 tablet (40 mg total) by mouth daily.  90 tablet  3  . carvedilol (COREG) 25 MG tablet Take 1 tablet (25 mg total) by mouth 2 (two) times daily with a meal.  180 tablet  3  . furosemide (LASIX) 20 MG tablet TAKE 1 TABLET DAILY (PATIENT NEEDS APPOINTMENT WITH DR. HOCHREIN)  90 tablet  0  . losartan (COZAAR) 50 MG tablet TAKE 1 TABLET TWICE A DAY (PATIENT NEEDS APPOINTMENT WITH DR. HOCHREIN)  180 tablet  0  . spironolactone (ALDACTONE) 50 MG tablet Take 1 tablet (50 mg total) by mouth daily.  90 tablet  3   No current facility-administered medications for this visit.    Past Medical History  Diagnosis Date  . ETOH abuse     Quit 1998  . Tobacco abuse     Quit Dec 2012  . Cardiomyopathy, dilated     a. 09/2012 Echo: EF 15-20%, diff HK, gr2 DD, Triv AI, Mod MR, mild TR/PR, mod-sev dil LA.  [name] PAF (paroxysmal atrial fibrillation)   . PVD (peripheral vascular  disease)     a. 05/2013 s/p PTA/self-expanding stenting of the REIA and LCIA.  [name] CAD (coronary artery disease) nonobstructive    a. 10/2012 Cath: LM 50, LAD 25p, 52m, Diag 83m, LCX 30p, 40d, OM1 60-70, OM3 30, RCA 40-64m, AM 70p, PDA nl, EF 20%.    Past Surgical History  Procedure Laterality Date  . Neck cyst resection    . Vasectomy        PHYSICAL EXAM BP 138/72  Pulse 71  Ht 5\' 4"  (1.626 m)  Wt 158 lb 3.2 oz (71.759 kg)  BMI 27.14 kg/m2 Constitutional: He is oriented to person, place, and time. He appears well-developed and well-nourished. No distress.  HENT: No nasal discharge.  Head: Normocephalic and atraumatic.  Eyes: Pupils are equal and round. Right eye exhibits no discharge. Left eye exhibits no discharge.  Neck: Normal range of motion. Neck supple. No JVD present. No thyromegaly present. Faint carotid bruits Cardiovascular: Normal rate, regular rhythm, normal heart sounds and. Exam reveals no gallop and no friction rub. No murmur heard.  Pulmonary/Chest: Effort normal and breath sounds normal. No stridor. No respiratory distress. He has no wheezes. He has no rales. He exhibits no tenderness.  Abdominal: Soft. Bowel sounds are normal. He exhibits no distension. There is no tenderness. There is no rebound and no guarding.  Musculoskeletal: Normal range  of motion. He exhibits no edema and no tenderness.  Neurological: He is alert and oriented to person, place, and time. Coordination normal.  Skin: Skin is warm and dry. No rash noted. He is not diaphoretic. No erythema. No pallor.  Psychiatric: He has a normal mood and affect. His behavior is normal. Judgment and thought content normal.  Vascular: Radial pulses are normal bilaterally. Femoral pulses: Slightly diminished on the right side and normal left. Distal pulses are palpable bilaterally.   ASSESSMENT AND PLAN

## 2014-02-26 ENCOUNTER — Telehealth: Payer: Self-pay

## 2014-02-26 NOTE — Telephone Encounter (Signed)
Request from [facility], sent to [employer] on 02/26/2014 . Second request

## 2014-04-01 ENCOUNTER — Other Ambulatory Visit: Payer: Self-pay | Admitting: Cardiology

## 2014-04-13 ENCOUNTER — Other Ambulatory Visit: Payer: Self-pay | Admitting: Cardiology

## 2014-04-16 ENCOUNTER — Other Ambulatory Visit: Payer: Self-pay | Admitting: Cardiology

## 2014-05-06 ENCOUNTER — Encounter: Payer: Self-pay | Admitting: Cardiology

## 2014-05-06 ENCOUNTER — Ambulatory Visit (INDEPENDENT_AMBULATORY_CARE_PROVIDER_SITE_OTHER): Payer: MEDICARE | Admitting: Cardiology

## 2014-05-06 VITALS — BP 115/74 | HR 69 | Ht 64.0 in | Wt 157.0 lb

## 2014-05-06 DIAGNOSIS — Z79899 Other long term (current) drug therapy: Secondary | ICD-10-CM

## 2014-05-06 DIAGNOSIS — I428 Other cardiomyopathies: Secondary | ICD-10-CM

## 2014-05-06 DIAGNOSIS — I2510 Atherosclerotic heart disease of native coronary artery without angina pectoris: Secondary | ICD-10-CM

## 2014-05-06 NOTE — Progress Notes (Signed)
HPI The patient presents for followup after a hospitalization in December. He was seen in consultation by us at the time of diverticulitis and bowel microperforation. He had atrial fibrillation paroxysmally in as part of this hospitalization. He had respiratory failure and required intubation. His ejection fraction was 20%.  He was not anticoagulated at that time because of his ongoing acute issues. I did repeat an echocardiogram in January confirming his cardiomyopathy. I subsequently did a cardiac catheterization which demonstrated left main 50% stenosis, LAD 40% stenosis, circumflex 30% followed by distal 40% stenosis, small obtuse marginal 60-70% stenosis and right coronary artery 40-50% stenosis. The EF was 20%.  He has also had PTA and stenting of his right iliac and left common iliac by Dr. Kirke CorinArida.  I did repeat an EKG after the last appt.  EF is about 30% which is slightly better than his lowest EF.  Since I last saw him he has had no new complaints. He can do yard work and climb a flight of stairs without shortness of breath. He denies any palpitations, presyncope or syncope. He denies any chest pressure, neck or arm discomfort. He is not drinking alcohol.   .No Known Allergies  Current Outpatient Prescriptions  Medication Sig Dispense Refill  . aspirin EC 81 MG tablet Take 1 tablet (81 mg total) by mouth daily.      [name]. atorvastatin (LIPITOR) 40 MG tablet TAKE 1 TABLET DAILY  90 tablet  0  . carvedilol (COREG) 25 MG tablet TAKE 1 TABLET TWICE A DAY WITH A MEAL  180 tablet  0  . furosemide (LASIX) 20 MG tablet TAKE 1 TABLET DAILY (NEED APPOINTMENT WITH DOCTOR Doneisha Ivey)  90 tablet  0  . losartan (COZAAR) 50 MG tablet TAKE 1 TABLET TWICE A DAY (NEED APPOINTMENT WITH DOCTOR Cornell Gaber)  180 tablet  0  . spironolactone (ALDACTONE) 50 MG tablet Take 1 tablet (50 mg total) by mouth daily.  90 tablet  3   No current facility-administered medications for this visit.    Past Medical History    Diagnosis Date  . ETOH abuse     Quit 1998  . Tobacco abuse     Quit Dec 2012  . Cardiomyopathy, dilated     a. 09/2012 Echo: EF 15-20%, diff HK, gr2 DD, Triv AI, Mod MR, mild TR/PR, mod-sev dil LA.  [name]. PAF (paroxysmal atrial fibrillation)   . PVD (peripheral vascular disease)     a. 05/2013 s/p PTA/self-expanding stenting of the REIA and LCIA.  [name]. CAD (coronary artery disease) nonobstructive    a. 10/2012 Cath: LM 50, LAD 25p, 6523m, Diag 489m, LCX 30p, 40d, OM1 60-70, OM3 30, RCA 40-4028m, AM 70p, PDA nl, EF 20%.    Past Surgical History  Procedure Laterality Date  . Neck cyst resection    . Vasectomy      ROS:  As stated in the HPI and negative for all other systems.  PHYSICAL EXAM BP 115/74  Pulse 69  Ht 5\' 4"  (1.626 m)  Wt 157 lb (71.215 kg)  BMI 26.94 kg/m2 GENERAL:  Well appearing HEENT: PERRL NECK:  No jugular venous distention, waveform within normal limits, carotid upstroke brisk and symmetric, subtle carotid bilateral bruits, no thyromegaly LUNGS:  Clear to auscultation bilaterally HEART:  PMI not displaced or sustained,S1 and S2 within normal limits, no S3, no S4, no clicks, no rubs, no murmurs ABD:  Flat, positive bowel sounds normal in frequency in pitch, positive midline soft bruits, no rebound,  no guarding, no midline pulsatile mass, no hepatomegaly, no splenomegaly EXT:  2 plus pulses upper, diminished bilateral lower, left femoral bruit no edema, no cyanosis no clubbing NEURO:  Nonfocal  EKG:  Sinus rhythm, rate 60, axis within normal limits, minimal will criteria for left ventricular hypertrophy with nonspecific ST-T wave changes.. 05/06/2014  ASSESSMENT AND PLAN  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 He has really class I symptoms even after careful questioning. No further medication change is indicated.  I will check a BMET in one month.   Atrial fibrillation He has had no recurrent dysrhythmias since stopping ETOH.  CAD This is nonobstructive.  He will  continue to be managed with risk reduction.  HTN The blood pressure is at target. No change in medications is indicated. We will continue with therapeutic lifestyle changes (TLC).   CAROTID BRUIT He has 40-59% right stenosis and 60-79% left stenosis and will have followup again in Dec  PVD He has had symptom relief.   We will continue with risk reduction.

## 2014-05-06 NOTE — Patient Instructions (Signed)
The current medical regimen is effective;  continue present plan and medications.  Please have blood work in 1 month at [facility]  Follow up in 6 months with Dr. Antoine Poche in [place].  You will receive a letter in the mail 2 months before you are due.  Please call us when you receive this letter to schedule your follow up appointment.

## 2014-05-18 ENCOUNTER — Ambulatory Visit (INDEPENDENT_AMBULATORY_CARE_PROVIDER_SITE_OTHER): Payer: BC Managed Care – PPO | Admitting: Ophthalmology

## 2014-05-18 DIAGNOSIS — I10 Essential (primary) hypertension: Secondary | ICD-10-CM

## 2014-05-18 DIAGNOSIS — H43819 Vitreous degeneration, unspecified eye: Secondary | ICD-10-CM

## 2014-05-18 DIAGNOSIS — H35039 Hypertensive retinopathy, unspecified eye: Secondary | ICD-10-CM

## 2014-05-18 DIAGNOSIS — H35379 Puckering of macula, unspecified eye: Secondary | ICD-10-CM

## 2014-06-08 ENCOUNTER — Other Ambulatory Visit (INDEPENDENT_AMBULATORY_CARE_PROVIDER_SITE_OTHER): Payer: BC Managed Care – PPO

## 2014-06-08 DIAGNOSIS — Z79899 Other long term (current) drug therapy: Secondary | ICD-10-CM

## 2014-06-08 DIAGNOSIS — I428 Other cardiomyopathies: Secondary | ICD-10-CM

## 2014-06-08 NOTE — Addendum Note (Signed)
Addended by: Orma Render F on: 06/08/2014 09:40 AM   Modules accepted: Orders

## 2014-06-08 NOTE — Progress Notes (Signed)
Lab only for hochrein

## 2014-06-09 LAB — BMP8+EGFR
BUN/Creatinine Ratio: 26 — ABNORMAL HIGH (ref 10–22)
BUN: 37 mg/dL — ABNORMAL HIGH (ref 8–27)
CHLORIDE: 100 mmol/L (ref 97–108)
CO2: 23 mmol/L (ref 18–29)
Calcium: 9.5 mg/dL (ref 8.6–10.2)
Creatinine, Ser: 1.43 mg/dL — ABNORMAL HIGH (ref 0.76–1.27)
GFR calc non Af Amer: 50 mL/min/{1.73_m2} — ABNORMAL LOW (ref >59)
GFR, EST AFRICAN AMERICAN: 57 mL/min/{1.73_m2} — AB (ref >59)
Glucose: 111 mg/dL — ABNORMAL HIGH (ref 65–99)
POTASSIUM: 5.1 mmol/L (ref 3.5–5.2)
SODIUM: 139 mmol/L (ref 134–144)

## 2014-06-18 ENCOUNTER — Other Ambulatory Visit: Payer: Self-pay | Admitting: *Deleted

## 2014-06-18 MED ORDER — FUROSEMIDE 20 MG PO TABS: ORAL_TABLET | ORAL | Status: DC

## 2014-06-18 MED ORDER — LOSARTAN POTASSIUM 50 MG PO TABS: ORAL_TABLET | ORAL | Status: DC

## 2014-06-19 ENCOUNTER — Telehealth: Payer: Self-pay | Admitting: Cardiology

## 2014-06-19 NOTE — Telephone Encounter (Signed)
Pt need you to call [facility] -Reference#-[id number]. He said that all they him to have his doctor to call and use this reference number. Please call patient after you call [facility].

## 2014-06-24 ENCOUNTER — Other Ambulatory Visit: Payer: Self-pay | Admitting: Cardiology

## 2014-06-25 NOTE — Telephone Encounter (Signed)
[facility] called and meds ordered

## 2014-07-04 IMAGING — CR DG CHEST 1V PORT
1 series · 1 of 1 positions shown · non-contrast
Comparison: Chest 09/03/2012 at.

CLINICAL DATA: ET tube placement.

PORTABLE CHEST - 1 VIEW

[AP]
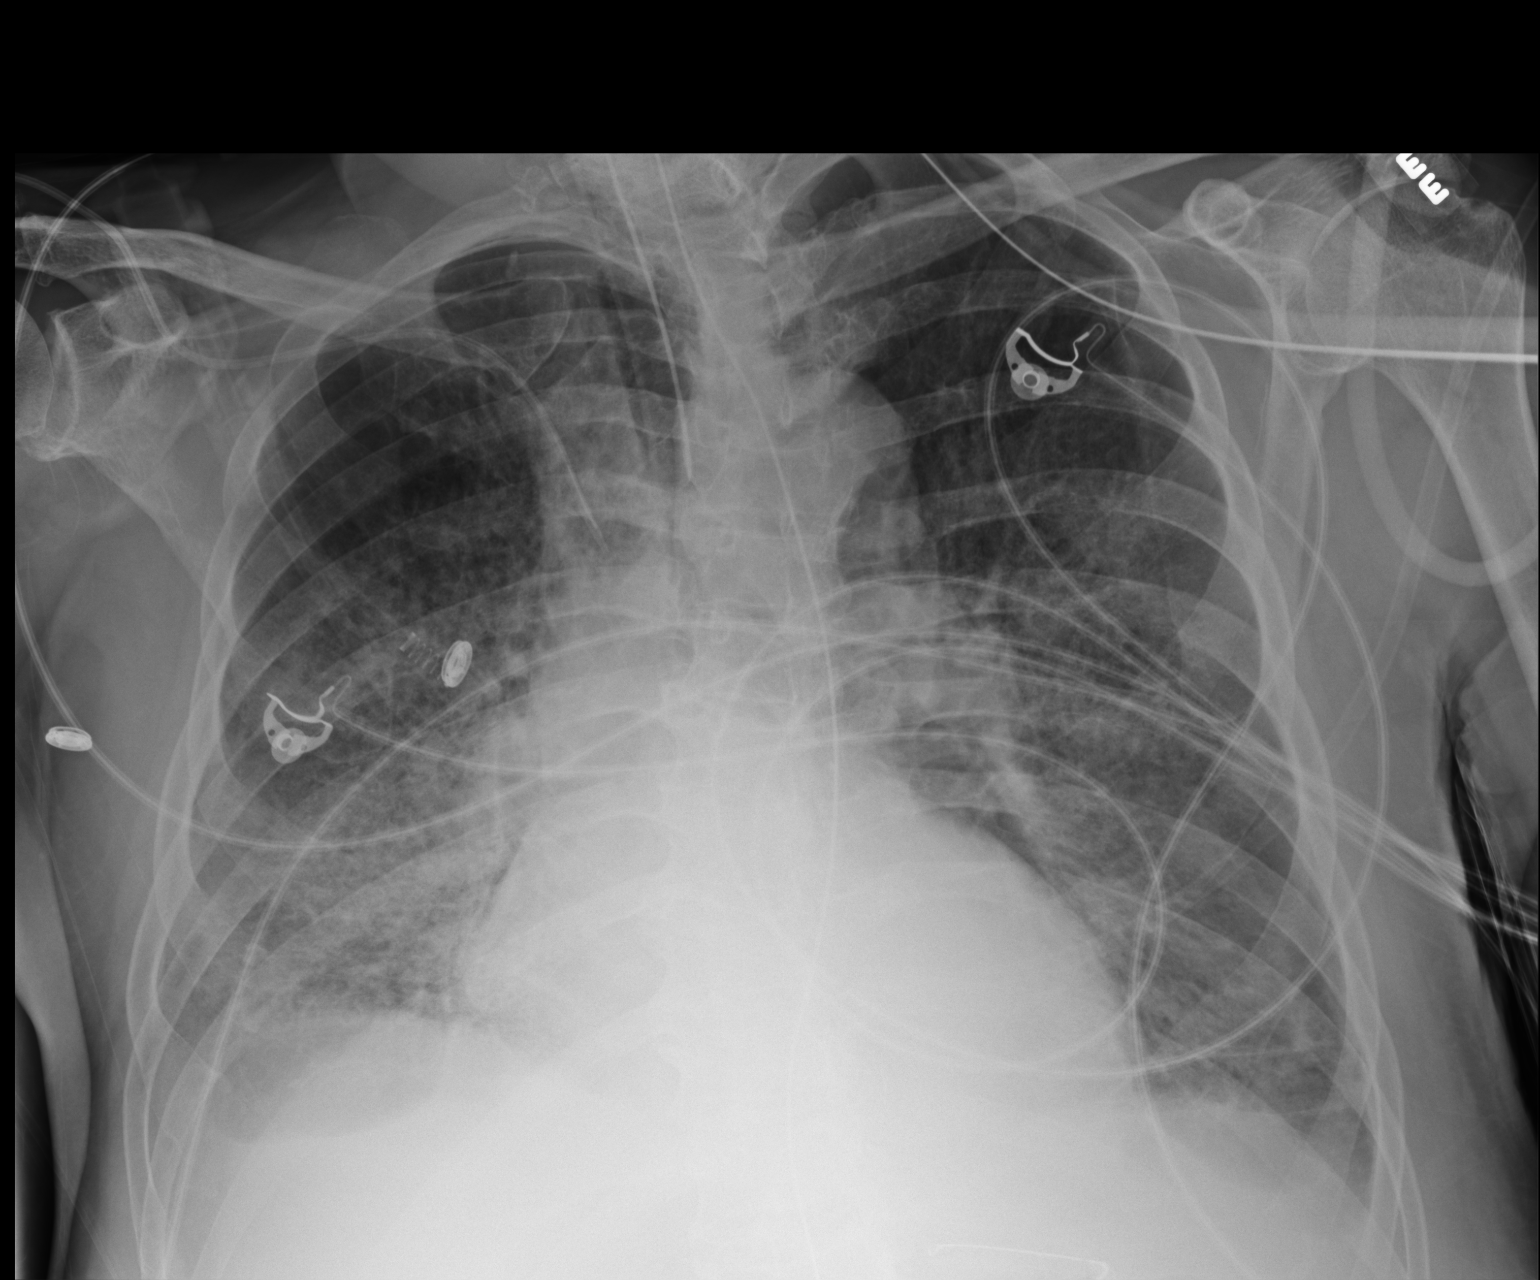

[1 of 1 positions shown; findings below may reference images not displayed]

FINDINGS: Endotracheal tube is in place with tip just below the
clavicular heads in good position.  NG tube courses into the
stomach.  Bilateral airspace disease has improved.  No pneumothorax
identified.  There appear be small bilateral pleural effusions.
IMPRESSION: 1.  ET tube in good position.
2.  Improved bilateral airspace disease.

## 2014-07-15 ENCOUNTER — Other Ambulatory Visit: Payer: Self-pay | Admitting: Cardiology

## 2014-07-17 ENCOUNTER — Telehealth: Payer: Self-pay

## 2014-07-17 NOTE — Telephone Encounter (Signed)
Request from [facility] , sent to [employer] on 07/17/2014 .

## 2014-08-14 ENCOUNTER — Other Ambulatory Visit (HOSPITAL_COMMUNITY): Payer: Self-pay | Admitting: *Deleted

## 2014-08-14 DIAGNOSIS — I739 Peripheral vascular disease, unspecified: Secondary | ICD-10-CM

## 2014-08-14 DIAGNOSIS — I6523 Occlusion and stenosis of bilateral carotid arteries: Secondary | ICD-10-CM

## 2014-08-24 ENCOUNTER — Telehealth: Payer: Self-pay | Admitting: Cardiology

## 2014-08-24 ENCOUNTER — Other Ambulatory Visit: Payer: Self-pay

## 2014-08-24 MED ORDER — FUROSEMIDE 20 MG PO TABS: ORAL_TABLET | ORAL | Status: DC

## 2014-08-24 NOTE — Telephone Encounter (Signed)
Pt would like a 30 days supply of his generic lasix until his mail order comes.Please call this today to [facility] [facility]

## 2014-08-27 ENCOUNTER — Encounter (HOSPITAL_COMMUNITY): Payer: Self-pay | Admitting: Cardiovascular Disease

## 2014-09-01 ENCOUNTER — Ambulatory Visit (HOSPITAL_COMMUNITY): Payer: MEDICARE | Attending: Cardiology | Admitting: Cardiology

## 2014-09-01 ENCOUNTER — Encounter: Payer: Self-pay | Admitting: Cardiovascular Disease

## 2014-09-01 ENCOUNTER — Ambulatory Visit (HOSPITAL_BASED_OUTPATIENT_CLINIC_OR_DEPARTMENT_OTHER): Payer: MEDICARE | Admitting: Cardiology

## 2014-09-01 ENCOUNTER — Ambulatory Visit (INDEPENDENT_AMBULATORY_CARE_PROVIDER_SITE_OTHER): Payer: MEDICARE | Admitting: Cardiovascular Disease

## 2014-09-01 VITALS — BP 122/68 | HR 66 | Ht 64.0 in | Wt 160.8 lb

## 2014-09-01 DIAGNOSIS — I739 Peripheral vascular disease, unspecified: Secondary | ICD-10-CM | POA: Insufficient documentation

## 2014-09-01 DIAGNOSIS — I6523 Occlusion and stenosis of bilateral carotid arteries: Secondary | ICD-10-CM

## 2014-09-01 DIAGNOSIS — I2510 Atherosclerotic heart disease of native coronary artery without angina pectoris: Secondary | ICD-10-CM

## 2014-09-01 DIAGNOSIS — E785 Hyperlipidemia, unspecified: Secondary | ICD-10-CM

## 2014-09-01 DIAGNOSIS — I700 Atherosclerosis of aorta: Secondary | ICD-10-CM | POA: Insufficient documentation

## 2014-09-01 NOTE — Assessment & Plan Note (Addendum)
He continues to do well with stable mild claudication. Noninvasive studies today showed: Borderline significant stenosis in the left common and external iliac arteries. Recommend a followup aortoiliac duplex an ABI in 12 months.

## 2014-09-01 NOTE — Progress Notes (Signed)
Primary Cardiologist: Dr. Antoine PocheHochrein PCP: Dr. Christell ConstantMoore  HPI The patient is a 68 year old male who is here for a follow up visit regarding  PAD. He has known history of nonobstructive coronary artery disease and nonischemic cardiomyopathy.   He was seen for severe claudication affecting his lower back, buttock and  Thigh with significant decrease in ABI (R>L) with evidence of inflow disease.  I proceeded with angiography in September of 2014 which showed severe right external iliac artery stenosis and left common iliac artery stenosis with moderate left external iliac artery stenosis. I performed successful self-expanding stent placement to both right external iliac artery and left common iliac artery.  He reports mild lower back and buttock claudication but he is not limited by this.  Most recent carotid Doppler showed 60-79% bilateral ICA stenosis.   .No Known Allergies  Current Outpatient Prescriptions  Medication Sig Dispense Refill  . aspirin EC 81 MG tablet Take 1 tablet (81 mg total) by mouth daily.    [name]. atorvastatin (LIPITOR) 40 MG tablet TAKE 1 TABLET DAILY 90 tablet 0  . carvedilol (COREG) 25 MG tablet TAKE 1 TABLET TWICE A DAY WITH MEALS 180 tablet 3  . furosemide (LASIX) 20 MG tablet TAKE 1 TABLET DAILY 30 tablet 0  . losartan (COZAAR) 50 MG tablet TAKE 1 TABLET TWICE A DAY 180 tablet 1  . spironolactone (ALDACTONE) 50 MG tablet Take 1 tablet (50 mg total) by mouth daily. 90 tablet 3   No current facility-administered medications for this visit.    Past Medical History  Diagnosis Date  . ETOH abuse     Quit 1998  . Tobacco abuse     Quit Dec 2012  . Cardiomyopathy, dilated     a. 09/2012 Echo: EF 15-20%, diff HK, gr2 DD, Triv AI, Mod MR, mild TR/PR, mod-sev dil LA.  [name]. PAF (paroxysmal atrial fibrillation)   . PVD (peripheral vascular disease)     a. 05/2013 s/p PTA/self-expanding stenting of the REIA and LCIA.  [name]. CAD (coronary artery disease) nonobstructive    a. 10/2012  Cath: LM 50, LAD 25p, 482m, Diag 3673m, LCX 30p, 40d, OM1 60-70, OM3 30, RCA 40-3883m, AM 70p, PDA nl, EF 20%.    Past Surgical History  Procedure Laterality Date  . Neck cyst resection    . Vasectomy    . Abdominal aortagram N/A 05/28/2013    Procedure: ABDOMINAL Ronny FlurryAORTAGRAM;  Surgeon: Iran OuchMuhammad A Arida, MD;  [facility];  Service: Cardiovascular;  Laterality: N/A;  . Percutaneous stent intervention Right 05/28/2013    Procedure: PERCUTANEOUS STENT INTERVENTION;  Surgeon: Iran OuchMuhammad A Arida, MD;  Location: [facility] CATH LAB;  Service: Cardiovascular;  Laterality: Right;      PHYSICAL EXAM BP 122/68 mmHg  Pulse 66  Ht 5\' 4"  (1.626 m)  Wt 160 lb 12.8 oz (72.938 kg)  BMI 27.59 kg/m2 Constitutional: He is oriented to person, place, and time. He appears well-developed and well-nourished. No distress.  HENT: No nasal discharge.  Head: Normocephalic and atraumatic.  Eyes: Pupils are equal and round. Right eye exhibits no discharge. Left eye exhibits no discharge.  Neck: Normal range of motion. Neck supple. No JVD present. No thyromegaly present. Faint carotid bruits Cardiovascular: Normal rate, regular rhythm, normal heart sounds and. Exam reveals no gallop and no friction rub. No murmur heard.  Pulmonary/Chest: Effort normal and breath sounds normal. No stridor. No respiratory distress. He has no wheezes. He has no rales. He exhibits no  tenderness.  Abdominal: Soft. Bowel sounds are normal. He exhibits no distension. There is no tenderness. There is no rebound and no guarding.  Musculoskeletal: Normal range of motion. He exhibits no edema and no tenderness.  Neurological: He is alert and oriented to person, place, and time. Coordination normal.  Skin: Skin is warm and dry. No rash noted. He is not diaphoretic. No erythema. No pallor.  Psychiatric: He has a normal mood and affect. His behavior is normal. Judgment and thought content normal.  Vascular: Radial pulses are normal bilaterally. Femoral  pulses: Slightly diminished on both sides. Distal pulses are palpable bilaterally.   ASSESSMENT AND PLAN

## 2014-09-01 NOTE — Progress Notes (Signed)
Abdominal Aorta duplex performed

## 2014-09-01 NOTE — Assessment & Plan Note (Signed)
No chest pain. Continue medical therapy. 

## 2014-09-01 NOTE — Patient Instructions (Addendum)
You will have your already scheduled vascular studies today.  Your physician has requested that you have an aorto- iliac duplex in 1 year. During this test, an ultrasound is used to evaluate the aorta. Allow 30 minutes for this exam. Do not eat after midnight the day before and avoid carbonated beverages.  Your physician recommends that you continue on your current medications as directed. Please refer to the Current Medication list given to you today.  Your physician wants you to follow-up in: 1 year with Dr. Kirke Corin. You will receive a reminder letter in the mail two months in advance. If you don't receive a letter, please call our office to schedule the follow-up appointment.

## 2014-09-01 NOTE — Progress Notes (Signed)
ABI performed  

## 2014-09-01 NOTE — Progress Notes (Addendum)
Carotid duplex performed   Results given to Dr.Arida.

## 2014-09-03 ENCOUNTER — Encounter: Payer: Self-pay | Admitting: Cardiovascular Disease

## 2014-09-03 DIAGNOSIS — I6523 Occlusion and stenosis of bilateral carotid arteries: Secondary | ICD-10-CM | POA: Insufficient documentation

## 2014-09-03 NOTE — Assessment & Plan Note (Signed)
He is due for repeat carotid Doppler.

## 2014-09-03 NOTE — Assessment & Plan Note (Signed)
Lab Results  Component Value Date   CHOL 134 09/02/2013   HDL 29* 12/31/2013   LDLCALC 77 12/31/2013   LDLDIRECT 74.9 09/02/2013   TRIG 205* 12/31/2013   CHOLHDL 5.1* 12/31/2013   Continue treatment with atorvastatin.

## 2014-09-09 ENCOUNTER — Other Ambulatory Visit: Payer: Self-pay

## 2014-09-09 DIAGNOSIS — I6523 Occlusion and stenosis of bilateral carotid arteries: Secondary | ICD-10-CM

## 2014-09-21 ENCOUNTER — Other Ambulatory Visit: Payer: Self-pay | Admitting: Cardiology

## 2014-09-28 ENCOUNTER — Other Ambulatory Visit: Payer: Self-pay

## 2014-09-28 MED ORDER — FUROSEMIDE 20 MG PO TABS: ORAL_TABLET | ORAL | Status: DC

## 2014-10-08 ENCOUNTER — Encounter: Payer: Self-pay | Admitting: Surgery

## 2014-10-08 ENCOUNTER — Other Ambulatory Visit: Payer: Self-pay | Admitting: *Deleted

## 2014-10-08 DIAGNOSIS — I6522 Occlusion and stenosis of left carotid artery: Secondary | ICD-10-CM

## 2014-10-12 ENCOUNTER — Ambulatory Visit (INDEPENDENT_AMBULATORY_CARE_PROVIDER_SITE_OTHER): Payer: Medicare (Managed Care) | Admitting: Surgery

## 2014-10-12 ENCOUNTER — Encounter: Payer: Self-pay | Admitting: Surgery

## 2014-10-12 ENCOUNTER — Ambulatory Visit (HOSPITAL_COMMUNITY)
Admission: RE | Admit: 2014-10-12 | Discharge: 2014-10-12 | Disposition: A | Discharge status: Home / Self Care | Payer: Medicare Other | Source: Ambulatory Visit | Attending: Surgery | Admitting: Surgery

## 2014-10-12 VITALS — BP 143/70 | HR 73 | Ht 64.0 in | Wt 163.3 lb

## 2014-10-12 DIAGNOSIS — I6523 Occlusion and stenosis of bilateral carotid arteries: Secondary | ICD-10-CM | POA: Diagnosis not present

## 2014-10-12 DIAGNOSIS — I6522 Occlusion and stenosis of left carotid artery: Secondary | ICD-10-CM

## 2014-10-12 NOTE — Progress Notes (Signed)
   Patient name: Ronnie Harvey MRN: [id number] DOB: 08/19/1946 Sex: male   Referred by: Dr. Hochrein  Reason for referral:  Chief Complaint  Patient presents with  . New Evaluation    carotid stenosis    HISTORY OF PRESENT ILLNESS: This is a very pleasant 68-year-old gentleman who is referred today for evaluation of carotid stenosis.his most recent ultrasound shows progression of the left side to greater than 80% and 60-79 percent on the right.  The patient denies symptoms.  Specifically he denies numbness or weakness in either extremity.  He denies slurred speech.  He denies amaurosis fugax.  The patient suffers from peripheral vascular disease and has undergone iliac stenting bilaterally.  The patient was hospitalized in December 2013 for diverticulitis with microperforation.  He ended up with respiratory failure and intubation.  He has undergone cardiac catheterization which shows a 40% LAD stenosis 30% circumflexstenosis 40-50 percent right coronary stenosis with an ejection fraction of 20%.  On subsequent imaging his ejection fraction has increased slightly to about 30%.  The patient suffers from hypercholesterolemia which is managed with a statin.  His hypertension is well controlled with an angiotensin receptorl blocker.  He is on aspirin for antiplatelet measures.he quit smoking in 2012.  Past Medical History  Diagnosis Date  . ETOH abuse     Quit 1998  . Tobacco abuse     Quit Dec 2012  . Cardiomyopathy, dilated     a. 09/2012 Echo: EF 15-20%, diff HK, gr2 DD, Triv AI, Mod MR, mild TR/PR, mod-sev dil LA.  . PAF (paroxysmal atrial fibrillation)   . PVD (peripheral vascular disease)     a. 05/2013 s/p PTA/self-expanding stenting of the REIA and LCIA.  . CAD (coronary artery disease) nonobstructive    a. 10/2012 Cath: LM 50, LAD 25p, 40m, Diag 25m, LCX 30p, 40d, OM1 60-70, OM3 30, RCA 40-50m, AM 70p, PDA nl, EF 20%.  . CHF (congestive heart failure)   . Myocardial infarction      Past Surgical History  Procedure Laterality Date  . Neck cyst resection    . Vasectomy    . Abdominal aortagram N/A 05/28/2013    Procedure: ABDOMINAL AORTAGRAM;  Surgeon: Muhammad A Arida, MD;  Location: [facility] CATH [facility];  Service: Cardiovascular;  Laterality: N/A;  . Percutaneous stent intervention Right 05/28/2013    Procedure: PERCUTANEOUS STENT INTERVENTION;  Surgeon: Muhammad A Arida, MD;  Location: [facility];  Service: Cardiovascular;  Laterality: Right;    History   Social History  . Marital Status: Married    Spouse Name: N/A    Number of Children: N/A  . Years of Education: N/A   Occupational History  . Not on file.   Social History Main Topics  . Smoking status: Former Smoker -- 1.00 packs/day for 49 years    Types: Cigarettes    Start date: 08/30/2012  . Smokeless tobacco: Not on file  . Alcohol Use: No     Comment: Former ETOH.  None in past 15 years.   . Drug Use: No  . Sexual Activity: Not on file   Other Topics Concern  . Not on file   Social History Narrative    Family History  Problem Relation Age of Onset  . Diabetes Mother   . Diabetes Father   . Diabetes Brother     Allergies as of 10/12/2014  . (No Known Allergies)    Current Outpatient Prescriptions on File Prior to Visit  Medication   Sig Dispense Refill  . aspirin EC 81 MG tablet Take 1 tablet (81 mg total) by mouth daily.    . atorvastatin (LIPITOR) 40 MG tablet TAKE 1 TABLET DAILY 90 tablet 1  . carvedilol (COREG) 25 MG tablet TAKE 1 TABLET TWICE A DAY WITH MEALS 180 tablet 3  . furosemide (LASIX) 20 MG tablet TAKE 1 TABLET DAILY 90 tablet 0  . losartan (COZAAR) 50 MG tablet TAKE 1 TABLET TWICE A DAY 180 tablet 1  . spironolactone (ALDACTONE) 50 MG tablet Take 1 tablet (50 mg total) by mouth daily. 90 tablet 3   No current facility-administered medications on file prior to visit.     REVIEW OF SYSTEMS: Cardiovascular: No chest pain, chest pressure, palpitations, orthopnea, . No  claudication or rest pain,  No history of DVT or phlebitis.positive for shortness of breath with activity Pulmonary: No productive cough, asthma or wheezing. Neurologic: No weakness, paresthesias, aphasia, or amaurosis. No dizziness. Hematologic: No bleeding problems or clotting disorders. Musculoskeletal: No joint pain or joint swelling. Gastrointestinal: No blood in stool or hematemesis Genitourinary: No dysuria or hematuria. Psychiatric:: No history of major depression. Integumentary: No rashes or ulcers. Constitutional: No fever or chills.  PHYSICAL EXAMINATION: General: The patient appears their stated age.  Vital signs are BP 143/70 mmHg  Pulse 73  Ht 5' 4" (1.626 m)  Wt 163 lb 4.8 oz (74.072 kg)  BMI 28.02 kg/m2  SpO2 95% HEENT:  No gross abnormalities Pulmonary: Respirations are non-labored Abdomen: Soft and non-tender  Musculoskeletal: There are no major deformities.   Neurologic: No focal weakness or paresthesias are detected, Skin: There are no ulcer or rashes noted. Psychiatric: The patient has normal affect. Cardiovascular: There is a regular rate and rhythm without significant murmur appreciated.no carotid bruits  Diagnostic Studies: Limited ultrasound was performed today which shows 60-79 percent left carotid stenosis.  I have reviewed the outside ultrasound which shows greater than 80% left carotid stenosis and 60-79% right carotid stenosis.  There is concern that the bifurcation is very high, and that there is a kink.    Assessment:  Asymptomatic bilateral carotid stenosis, left greater than right Plan: The patient has bilateral carotid stenosis, left greater than right which is asymptomatic.  I think that he needs to undergo better diagnostic imaging to define his anatomy.  Ultrasound suggested a very high bifurcation on the left.  There was also is the suggestion of a kink or a loop within the carotid artery.  I would like to get angiographic images of  bilateral carotid arteries.  I talked about the treatment options with the patient including stenting, endarterectomy, or participating in CREST II.  He is leaning towards intervention if needed.  His angiogram has been scheduled for Tuesday, February 9     V. Wells Brabham IV, M.D. [facility] [place] Office: [phone] Pager:  [phone]   

## 2014-10-15 ENCOUNTER — Other Ambulatory Visit: Payer: Self-pay

## 2014-10-19 ENCOUNTER — Other Ambulatory Visit: Payer: Self-pay

## 2014-10-22 ENCOUNTER — Encounter (HOSPITAL_COMMUNITY): Payer: Self-pay | Admitting: Pharmacy Technician

## 2014-10-26 MED ORDER — SODIUM CHLORIDE 0.9 % IV SOLN
INTRAVENOUS | Status: DC
  Administered 2014-10-27: 1000 mL via INTRAVENOUS

## 2014-10-27 ENCOUNTER — Telehealth: Payer: Self-pay | Admitting: Surgery

## 2014-10-27 ENCOUNTER — Encounter (HOSPITAL_COMMUNITY): Payer: Self-pay | Admitting: Surgery

## 2014-10-27 ENCOUNTER — Encounter (HOSPITAL_COMMUNITY)
Admission: RE | Disposition: A | Discharge status: Home / Self Care | Payer: Self-pay | Source: Ambulatory Visit | Attending: Surgery

## 2014-10-27 ENCOUNTER — Ambulatory Visit (HOSPITAL_COMMUNITY)
Admission: RE | Admit: 2014-10-27 | Discharge: 2014-10-27 | Disposition: A | Discharge status: Home / Self Care | Payer: Medicare Other | Source: Ambulatory Visit | Attending: Surgery | Admitting: Surgery

## 2014-10-27 DIAGNOSIS — I480 Paroxysmal atrial fibrillation: Secondary | ICD-10-CM | POA: Insufficient documentation

## 2014-10-27 DIAGNOSIS — Z7982 Long term (current) use of aspirin: Secondary | ICD-10-CM | POA: Insufficient documentation

## 2014-10-27 DIAGNOSIS — Z79899 Other long term (current) drug therapy: Secondary | ICD-10-CM | POA: Insufficient documentation

## 2014-10-27 DIAGNOSIS — Z87891 Personal history of nicotine dependence: Secondary | ICD-10-CM | POA: Insufficient documentation

## 2014-10-27 DIAGNOSIS — I509 Heart failure, unspecified: Secondary | ICD-10-CM | POA: Diagnosis not present

## 2014-10-27 DIAGNOSIS — I6523 Occlusion and stenosis of bilateral carotid arteries: Secondary | ICD-10-CM | POA: Insufficient documentation

## 2014-10-27 DIAGNOSIS — I2510 Atherosclerotic heart disease of native coronary artery without angina pectoris: Secondary | ICD-10-CM | POA: Insufficient documentation

## 2014-10-27 DIAGNOSIS — I429 Cardiomyopathy, unspecified: Secondary | ICD-10-CM | POA: Insufficient documentation

## 2014-10-27 DIAGNOSIS — I252 Old myocardial infarction: Secondary | ICD-10-CM | POA: Diagnosis not present

## 2014-10-27 HISTORY — PX: CAROTID ANGIOGRAM: SHX5504

## 2014-10-27 LAB — POCT I-STAT, CHEM 8
BUN: 30 mg/dL — ABNORMAL HIGH (ref 6–23)
CALCIUM ION: 1.17 mmol/L (ref 1.13–1.30)
CHLORIDE: 105 mmol/L (ref 96–112)
CREATININE: 1.3 mg/dL (ref 0.50–1.35)
GLUCOSE: 114 mg/dL — AB (ref 70–99)
HCT: 41 % (ref 39.0–52.0)
Hemoglobin: 13.9 g/dL (ref 13.0–17.0)
POTASSIUM: 4.1 mmol/L (ref 3.5–5.1)
Sodium: 142 mmol/L (ref 135–145)
TCO2: 21 mmol/L (ref 0–100)

## 2014-10-27 SURGERY — CAROTID ANGIOGRAM
Anesthesia: LOCAL | Laterality: Bilateral

## 2014-10-27 MED ORDER — OXYCODONE HCL 5 MG PO TABS
5.0000 mg | ORAL_TABLET | ORAL | Status: DC | PRN
  Filled 2014-10-27: qty 2

## 2014-10-27 MED ORDER — MORPHINE SULFATE 10 MG/ML IJ SOLN: 2.0000 mg | INTRAMUSCULAR | Status: DC | PRN

## 2014-10-27 MED ORDER — LABETALOL HCL 5 MG/ML IV SOLN: 10.0000 mg | INTRAVENOUS | Status: DC | PRN

## 2014-10-27 MED ORDER — SODIUM CHLORIDE 0.9 % IV SOLN
1.0000 mL/kg/h | INTRAVENOUS | Status: DC
  Administered 2014-10-27: 1 mL/kg/h via INTRAVENOUS

## 2014-10-27 MED ORDER — ACETAMINOPHEN 325 MG RE SUPP
325.0000 mg | RECTAL | Status: DC | PRN
Start: 2014-10-27 — End: 2014-10-27
  Filled 2014-10-27: qty 2

## 2014-10-27 MED ORDER — METOPROLOL TARTRATE 1 MG/ML IV SOLN: 2.0000 mg | INTRAVENOUS | Status: DC | PRN

## 2014-10-27 MED ORDER — GUAIFENESIN-DM 100-10 MG/5ML PO SYRP
15.0000 mL | ORAL_SOLUTION | ORAL | Status: DC | PRN
  Filled 2014-10-27: qty 15

## 2014-10-27 MED ORDER — ALUM & MAG HYDROXIDE-SIMETH 200-200-20 MG/5ML PO SUSP
15.0000 mL | ORAL | Status: DC | PRN
  Filled 2014-10-27: qty 30

## 2014-10-27 MED ORDER — HYDRALAZINE HCL 20 MG/ML IJ SOLN: 5.0000 mg | INTRAMUSCULAR | Status: DC | PRN

## 2014-10-27 MED ORDER — LIDOCAINE HCL (PF) 1 % IJ SOLN
INTRAMUSCULAR | Status: AC
  Filled 2014-10-27: qty 30

## 2014-10-27 MED ORDER — ACETAMINOPHEN 325 MG PO TABS
325.0000 mg | ORAL_TABLET | ORAL | Status: DC | PRN
Start: 2014-10-27 — End: 2014-10-27
  Filled 2014-10-27: qty 2

## 2014-10-27 MED ORDER — ONDANSETRON HCL 4 MG/2ML IJ SOLN: 4.0000 mg | Freq: Four times a day (QID) | INTRAMUSCULAR | Status: DC | PRN

## 2014-10-27 MED ORDER — HEPARIN (PORCINE) IN NACL 2-0.9 UNIT/ML-% IJ SOLN
INTRAMUSCULAR | Status: AC
  Filled 2014-10-27: qty 1000

## 2014-10-27 MED ORDER — PHENOL 1.4 % MT LIQD
1.0000 | OROMUCOSAL | Status: DC | PRN
  Filled 2014-10-27: qty 177

## 2014-10-27 NOTE — Telephone Encounter (Signed)
Spoke with patient to schedule, dpm °

## 2014-10-27 NOTE — Interval H&P Note (Signed)
History and Physical Interval Note:  10/27/2014 8:06 AM  Ronnie Harvey  has presented today for surgery, with the diagnosis of pvd  The various methods of treatment have been discussed with the patient and family. After consideration of risks, benefits and other options for treatment, the patient has consented to  Procedure(s): CAROTID ANGIOGRAM (Bilateral) as a surgical intervention .  The patient's history has been reviewed, patient examined, no change in status, stable for surgery.  I have reviewed the patient's chart and labs.  Questions were answered to the patient's satisfaction.     Rasheen Bells IV, V. WELLS

## 2014-10-27 NOTE — Progress Notes (Signed)
Neuro assessment-PERRL, upper and lower extremities equal bilaterally in strength. Speech clear and appropriate; alert and oriented. Smile symmetrical; tongue midline.

## 2014-10-27 NOTE — Progress Notes (Addendum)
8fr sheath aspirated and removed from rfa manual pressure applied for 20 minutes. Groin level 0 distal pulses bilateral 2+ bp.  Bedrest instructions given. Normal neuro checks.     Bedrest begins at 10:00am

## 2014-10-27 NOTE — H&P (View-Only) (Signed)
Patient name: Ronnie Harvey MRN: [id number] DOB: [birth year] Sex: male   Referred by: Dr. Antoine Poche  Reason for referral:  Chief Complaint  Patient presents with  . New Evaluation    carotid stenosis    HISTORY OF PRESENT ILLNESS: This is a very pleasant 69 year old gentleman who is referred today for evaluation of carotid stenosis.his most recent ultrasound shows progression of the left side to greater than 80% and 60-79 percent on the right.  The patient denies symptoms.  Specifically he denies numbness or weakness in either extremity.  He denies slurred speech.  He denies amaurosis fugax.  The patient suffers from peripheral vascular disease and has undergone iliac stenting bilaterally.  The patient was hospitalized in December 2013 for diverticulitis with microperforation.  He ended up with respiratory failure and intubation.  He has undergone cardiac catheterization which shows a 40% LAD stenosis 30% circumflexstenosis 40-50 percent right coronary stenosis with an ejection fraction of 20%.  On subsequent imaging his ejection fraction has increased slightly to about 30%.  The patient suffers from hypercholesterolemia which is managed with a statin.  His hypertension is well controlled with an angiotensin receptorl blocker.  He is on aspirin for antiplatelet measures.he quit smoking in 2012.  Past Medical History  Diagnosis Date  . ETOH abuse     Quit 1998  . Tobacco abuse     Quit Dec 2012  . Cardiomyopathy, dilated     a. 09/2012 Echo: EF 15-20%, diff HK, gr2 DD, Triv AI, Mod MR, mild TR/PR, mod-sev dil LA.  [name] PAF (paroxysmal atrial fibrillation)   . PVD (peripheral vascular disease)     a. 05/2013 s/p PTA/self-expanding stenting of the REIA and LCIA.  [name] CAD (coronary artery disease) nonobstructive    a. 10/2012 Cath: LM 50, LAD 25p, 15m, Diag 28m, LCX 30p, 40d, OM1 60-70, OM3 30, RCA 40-49m, AM 70p, PDA nl, EF 20%.  . CHF (congestive heart failure)   . Myocardial infarction      Past Surgical History  Procedure Laterality Date  . Neck cyst resection    . Vasectomy    . Abdominal aortagram N/A 05/28/2013    Procedure: ABDOMINAL Ronny Flurry;  Surgeon: Iran Ouch, MD;  Location[facility];  Service: Cardiovascular;  Laterality: N/A;  . Percutaneous stent intervention Right 05/28/2013    Procedure: PERCUTANEOUS STENT INTERVENTION;  Surgeon: Iran Ouch, MD;  Location: [facility] CATH [facility];  Service: Cardiovascular;  Laterality: Right;    History   Social History  . Marital Status: Married    Spouse Name: N/A    Number of Children: N/A  . Years of Education: N/A   Occupational History  . Not on file.   Social History Main Topics  . Smoking status: Former Smoker -- 1.00 packs/day for 49 years    Types: Cigarettes    Start date: 08/30/2012  . Smokeless tobacco: Not on file  . Alcohol Use: No     Comment: Former ETOH.  None in past 15 years.   . Drug Use: No  . Sexual Activity: Not on file   Other Topics Concern  . Not on file   Social History Narrative    Family History  Problem Relation Age of Onset  . Diabetes Mother   . Diabetes Father   . Diabetes Brother     Allergies as of 10/12/2014  . (No Known Allergies)    Current Outpatient Prescriptions on File Prior to Visit  Medication  Sig Dispense Refill  . aspirin EC 81 MG tablet Take 1 tablet (81 mg total) by mouth daily.    [name] atorvastatin (LIPITOR) 40 MG tablet TAKE 1 TABLET DAILY 90 tablet 1  . carvedilol (COREG) 25 MG tablet TAKE 1 TABLET TWICE A DAY WITH MEALS 180 tablet 3  . furosemide (LASIX) 20 MG tablet TAKE 1 TABLET DAILY 90 tablet 0  . losartan (COZAAR) 50 MG tablet TAKE 1 TABLET TWICE A DAY 180 tablet 1  . spironolactone (ALDACTONE) 50 MG tablet Take 1 tablet (50 mg total) by mouth daily. 90 tablet 3   No current facility-administered medications on file prior to visit.     REVIEW OF SYSTEMS: Cardiovascular: No chest pain, chest pressure, palpitations, orthopnea, . No  claudication or rest pain,  No history of DVT or phlebitis.positive for shortness of breath with activity Pulmonary: No productive cough, asthma or wheezing. Neurologic: No weakness, paresthesias, aphasia, or amaurosis. No dizziness. Hematologic: No bleeding problems or clotting disorders. Musculoskeletal: No joint pain or joint swelling. Gastrointestinal: No blood in stool or hematemesis Genitourinary: No dysuria or hematuria. Psychiatric:: No history of major depression. Integumentary: No rashes or ulcers. Constitutional: No fever or chills.  PHYSICAL EXAMINATION: General: The patient appears their stated age.  Vital signs are BP 143/70 mmHg  Pulse 73  Ht 5\' 4"  (1.626 m)  Wt 163 lb 4.8 oz (74.072 kg)  BMI 28.02 kg/m2  SpO2 95% HEENT:  No gross abnormalities Pulmonary: Respirations are non-labored Abdomen: Soft and non-tender  Musculoskeletal: There are no major deformities.   Neurologic: No focal weakness or paresthesias are detected, Skin: There are no ulcer or rashes noted. Psychiatric: The patient has normal affect. Cardiovascular: There is a regular rate and rhythm without significant murmur appreciated.no carotid bruits  Diagnostic Studies: Limited ultrasound was performed today which shows 60-79 percent left carotid stenosis.  I have reviewed the outside ultrasound which shows greater than 80% left carotid stenosis and 60-79% right carotid stenosis.  There is concern that the bifurcation is very high, and that there is a kink.    Assessment:  Asymptomatic bilateral carotid stenosis, left greater than right Plan: The patient has bilateral carotid stenosis, left greater than right which is asymptomatic.  I think that he needs to undergo better diagnostic imaging to define his anatomy.  Ultrasound suggested a very high bifurcation on the left.  There was also is the suggestion of a kink or a loop within the carotid artery.  I would like to get angiographic images of  bilateral carotid arteries.  I talked about the treatment options with the patient including stenting, endarterectomy, or participating in CREST II.  He is leaning towards intervention if needed.  His angiogram has been scheduled for Tuesday, February 9     V. Charlena Cross, M.D. [facility] [place] Office: [phone] Pager:  [phone]

## 2014-10-27 NOTE — Op Note (Signed)
    Patient name: Ronnie Harvey MRN: [id number] DOB: Jun 14, 1946 Sex: male  10/27/2014 Pre-operative Diagnosis: Bilateral carotid stenosis Post-operative diagnosis:  Same Surgeon:  Jorge Ny Procedure Performed:  1.  Ultrasound-guided access, right femoral artery  2.  Aortic arch angiogram  3.  Second order catheterization (right carotid artery)  4.  Right carotid angiogram  5.  First order catheterization (left carotid artery)  6.  Left carotid angiogram     Indications:  The patient was found to have bilateral carotid stenosis by ultrasound.  There was concern regarding the location of the lesion (being too high) as well as a kink.  He comes in today for further diagnostic imaging.  Procedure:  The patient was identified in the holding area and taken to room 8.  The patient was then placed supine on the table and prepped and draped in the usual sterile fashion.  A time out was called.  Ultrasound was used to evaluate the right common femoral artery.  It was patent .  A digital ultrasound image was acquired.  A micropuncture needle was used to access the right common femoral artery under ultrasound guidance.  An 018 wire was advanced without resistance and a micropuncture sheath was placed.  The 018 wire was removed and a benson wire was placed.  The micropuncture sheath was exchanged for a 5 french sheath.  A pigtail catheter was placed into the aortic arch and a aortic arch Carlyon Prows was performed.  Next using a Berenstein 2 catheter, the right common carotid artery was selected and a right carotid and Cheree Ditto was performed with intracranial imaging.  Next using a Simmons 1 catheter, the left common carotid artery was selected and a left carotid angiogram was performed.  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  No significant ostial stenosis is identified within the great vessels.  Right carotid artery: No significant right common carotid artery stenosis is  identified.  There is approximately 60% stenosis within the right internal carotid artery.  Left carotid artery: Approximate 90% stenosis is identified within the origin of the left internal carotid artery.  This is associated with tortuosity.  The lesion is high at the level of C2.  Intervention:  None  Impression:  #1  type I aortic arch  #2  60% right carotid stenosis  #3  90% left carotid stenosis with high bifurcation and tortuosity  #4  patient will be brought back for discussions regarding left carotid endarterectomy    V. Durene Cal, M.D. [facility] [place] Office: [phone] Pager:  [phone]

## 2014-10-27 NOTE — Discharge Instructions (Signed)
Angiogram, Care After ° °Refer to this sheet in the next few weeks. These instructions provide you with information on caring for yourself after your procedure. Your health care provider may also give you more specific instructions. Your treatment has been planned according to current medical practices, but problems sometimes occur. Call your health care provider if you have any problems or questions after your procedure.  °WHAT TO EXPECT AFTER THE PROCEDURE °After your procedure, it is typical to have the following sensations: °· Minor discomfort or tenderness and a small bump at the catheter insertion site. The bump should usually decrease in size and tenderness within 1 to 2 weeks. °· Any bruising will usually fade within 2 to 4 weeks. °HOME CARE INSTRUCTIONS  °· You may need to keep taking blood thinners if they were prescribed for you. Take medicines only as directed by your health care provider. °· Do not apply powder or lotion to the site. °· Do not take baths, swim, or use a hot tub until your health care provider approves. °· You may shower 24 hours after the procedure. Remove the bandage (dressing) and gently wash the site with plain soap and water. Gently pat the site dry. °· Inspect the site at least twice daily. °· Limit your activity for the first 24 hours. Do not bend, squat, or lift anything over 10 lb (9 kg) or as directed by your health care provider. °· Plan to have someone take you home after the procedure. Follow instructions about when you can drive or return to work. °SEEK MEDICAL CARE IF: °· You get light-headed when standing up. °· You have drainage (other than a small amount of blood on the dressing). °· You have chills. °· You have a fever. °· You have redness, warmth, swelling, or pain at the insertion site. °SEEK IMMEDIATE MEDICAL CARE IF:  °· You develop chest pain or shortness of breath, feel faint, or pass out. °· You have bleeding, swelling larger than a walnut, or drainage from the  catheter insertion site. °· You develop pain, discoloration, coldness, or severe bruising in the leg or arm that held the catheter. °· You have heavy bleeding from the site. If this happens, hold pressure on the site. °MAKE SURE YOU: °· Understand these instructions. °· Will watch your condition. °· Will get help right away if you are not doing well or get worse. °Document Released: 03/23/2005 Document Revised: 01/19/2014 Document Reviewed: 01/27/2013 °Exit[employer]® Patient Information ©2015 [employer]. This information is not intended to replace advice given to you by your health care provider. Make sure you discuss any questions you have with your health care provider. ° °

## 2014-10-27 NOTE — Telephone Encounter (Signed)
-----   Message from Sharee Pimple, RN sent at 10/27/2014  9:57 AM EST ----- Regarding: Schedule   ----- Message -----    From: Nada Libman, MD    Sent: 10/27/2014   9:03 AM      To: [facility] Charge Pool  10/27/2014:  Surgeon:  Jorge Ny Procedure Performed:  1.  Ultrasound-guided access, right femoral artery  2.  Aortic arch angiogram  3.  Second order catheterization (right carotid artery)  4.  Right carotid angiogram  5.  First order catheterization (left carotid artery)  6.  Left carotid angiogram   Please schedule the patient to follow-up with the in the office within the next 12 weeks regarding discussions of left carotid endarterectomy.

## 2014-10-31 NOTE — Consult Note (Signed)
NAME:  Ronnie Harvey, Ronnie Harvey               ACCOUNT NO.:  [id number]  MEDICAL RECORD NO.:  [id number]  LOCATION:  [facility]                         FACILITY:  [facility]  PHYSICIAN:  Zohar Maroney K. Lannah Koike, M.D.DATE OF BIRTH:  1946-08-22  DATE OF CONSULTATION:  10/27/2014 DATE OF DISCHARGE:  10/27/2014                                CONSULTATION   EXAMINATION:  Intracranial interpretation of bilateral common carotid arteriograms.  CLINICAL HISTORY:  Left carotid stenosis.  FINDINGS:  The right common carotid arteriogram demonstrates the right internal carotid artery, the cervical petrous junction to be normal.  The petrous, the cavernous, and supraclinoid segments are widely patent.  A right posterior communicating artery is seen opacifying the right posterior cerebral artery distribution.  The right middle cerebral artery in its mid M1 segment demonstrates a focal significant stenosis.  The trifurcation branches are normally opacified.  The right anterior cerebral artery is seen opacified normally into the capillary and venous phases.  Transient cross filling via the anterior communicating artery of the left anterior cerebral artery A-2 segment is seen.  The left common carotid arteriogram demonstrates mild focal stenosis of the left internal carotid artery in the cervical segment distally.  The petrous and the cavernous segments are widely patent with suggestion of mild fusiform dilatation of the cavernous segment proximally.  The supraclinoid segment is widely patent.  The left middle and the left anterior cerebral artery opacifies normally into the capillary and venous phases.  Being demonstrated is a left posterior communicating artery opacifying the left posterior cerebral artery distribution.  IMPRESSION: 1. 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 2. Mild atherosclerotic related stenosis of the left internal carotid     artery distal  cervical segment at the skull base,     probably related to atherosclerosis. 3. 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          ______________________________ Grandville Silos Corliss Skains, M.D.     SKD/MEDQ  D:  10/30/2014  T:  10/31/2014  Job:  [id number]

## 2014-11-05 ENCOUNTER — Other Ambulatory Visit: Payer: Self-pay | Admitting: Cardiology

## 2014-11-11 ENCOUNTER — Other Ambulatory Visit (INDEPENDENT_AMBULATORY_CARE_PROVIDER_SITE_OTHER): Payer: BLUE CROSS/BLUE SHIELD

## 2014-11-11 ENCOUNTER — Ambulatory Visit (INDEPENDENT_AMBULATORY_CARE_PROVIDER_SITE_OTHER): Payer: Medicare (Managed Care) | Admitting: Cardiology

## 2014-11-11 ENCOUNTER — Encounter: Payer: Self-pay | Admitting: Cardiology

## 2014-11-11 VITALS — BP 102/70 | HR 62 | Ht 64.0 in | Wt 164.0 lb

## 2014-11-11 DIAGNOSIS — R0602 Shortness of breath: Secondary | ICD-10-CM

## 2014-11-11 DIAGNOSIS — I480 Paroxysmal atrial fibrillation: Secondary | ICD-10-CM

## 2014-11-11 MED ORDER — SPIRONOLACTONE 50 MG PO TABS: 50.0000 mg | ORAL_TABLET | Freq: Every day | ORAL | Status: DC

## 2014-11-11 MED ORDER — FUROSEMIDE 20 MG PO TABS: 20.0000 mg | ORAL_TABLET | Freq: Every day | ORAL | Status: DC

## 2014-11-11 MED ORDER — ATORVASTATIN CALCIUM 40 MG PO TABS: ORAL_TABLET | ORAL | Status: DC

## 2014-11-11 NOTE — Progress Notes (Signed)
HPI The patient presents for followup after a hospitalization in December. He was seen in consultation by [place] at the time of diverticulitis and bowel microperforation. He had atrial fibrillation paroxysmally in as part of this hospitalization. He had respiratory failure and required intubation. His ejection fraction was 20%.  He was not anticoagulated at that time because of his ongoing acute issues. I did repeat an echocardiogram in January confirming his cardiomyopathy. I subsequently did a cardiac catheterization which demonstrated left main 50% stenosis, LAD 40% stenosis, circumflex 30% followed by distal 40% stenosis, small obtuse marginal 60-70% stenosis and right coronary artery 40-50% stenosis. The EF was 20%.  He has also had PTA and stenting of his right iliac and left common iliac by Dr. Kirke Corin.  I did repeat an echo and the EF is about 30% which is slightly better than his lowest EF.  He is now being considered for carotid endarterectomy. From a cardiac standpoint he's had no acute issues. He denies any chest pressure, neck or arm discomfort. He has had some slight increasing shortness of breath. However, he's been eating more and has gained some weight. He doesn't particularly watch his salt. He's not active. He denies any chest pressure, neck or arm discomfort. He's not had any palpitations, presyncope or syncope. He has no PND or orthopnea. He is not describing edema.   Current Outpatient Prescriptions  Medication Sig Dispense Refill  . aspirin EC 81 MG tablet Take 1 tablet (81 mg total) by mouth daily.    [name] atorvastatin (LIPITOR) 40 MG tablet TAKE 1 TABLET DAILY (Patient taking differently: Take  by mouth daily) 90 tablet 1  . carvedilol (COREG) 25 MG tablet TAKE 1 TABLET TWICE A DAY WITH MEALS (Patient taking differently: Take  by mouth twice daily with meals) 180 tablet 3  . furosemide (LASIX) 20 MG tablet TAKE 1 TABLET DAILY (Patient taking differently: Take 20 mg by mouth  daily. ) 90 tablet 0  . losartan (COZAAR) 50 MG tablet TAKE 1 TABLET TWICE A DAY (Patient taking differently: Take 50 mg by mouth 2 (two) times daily. ) 180 tablet 1  . spironolactone (ALDACTONE) 50 MG tablet TAKE 1 TABLET DAILY 90 tablet 1   No current facility-administered medications for this visit.    Past Medical History  Diagnosis Date  . ETOH abuse     Quit 1998  . Tobacco abuse     Quit Dec 2012  . Cardiomyopathy, dilated     a. 09/2012 Echo: EF 15-20%, diff HK, gr2 DD, Triv AI, Mod MR, mild TR/PR, mod-sev dil LA.  [name] PAF (paroxysmal atrial fibrillation)   . PVD (peripheral vascular disease)     a. 05/2013 s/p PTA/self-expanding stenting of the REIA and LCIA.  [name] CAD (coronary artery disease) nonobstructive    a. 10/2012 Cath: LM 50, LAD 25p, 76m, Diag 97m, LCX 30p, 40d, OM1 60-70, OM3 30, RCA 40-30m, AM 70p, PDA nl, EF 20%.  . CHF (congestive heart failure)   . Myocardial infarction     Past Surgical History  Procedure Laterality Date  . Neck cyst resection    . Vasectomy    . Abdominal aortagram N/A 05/28/2013    Procedure: ABDOMINAL Ronny Flurry;  Surgeon: Iran Ouch, MD;  Location: [facility] CATH LAB;  Service: Cardiovascular;  Laterality: N/A;  . Percutaneous stent intervention Right 05/28/2013    Procedure: PERCUTANEOUS STENT INTERVENTION;  Surgeon: Iran Ouch, MD;  Location: [facility] CATH LAB;  Service: Cardiovascular;  Laterality:  Right;  . Carotid angiogram Bilateral 10/27/2014    Procedure: CAROTID ANGIOGRAM;  Surgeon: Nada Libman, MD;  Location: [facility] CATH LAB;  Service: Cardiovascular;  Laterality: Bilateral;    ROS:  As stated in the HPI and negative for all other systems.  PHYSICAL EXAM BP 102/70 mmHg  Pulse 62  Ht 5\' 4"  (1.626 m)  Wt 164 lb (74.39 kg)  BMI 28.14 kg/m2 GENERAL:  Well appearing HEENT: PERRL NECK:  No jugular venous distention, waveform within normal limits, carotid upstroke brisk and symmetric, subtle carotid bilateral bruits, no  thyromegaly LUNGS:  Clear to auscultation bilaterally HEART:  PMI not displaced or sustained,S1 and S2 within normal limits, no S3, no S4, no clicks, no rubs, no murmurs ABD:  Flat, positive bowel sounds normal in frequency in pitch, positive midline soft bruits, no rebound, no guarding, no midline pulsatile mass, no hepatomegaly, no splenomegaly EXT:  2 plus pulses upper, diminished bilateral lower, left femoral bruit no edema, no cyanosis no clubbing NEURO:  Nonfocal  EKG:  Sinus rhythm, rate 62, axis within normal limits, minimal will criteria for left ventricular hypertrophy with nonspecific ST-T wave changes.. 11/11/2014  ASSESSMENT AND PLAN  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 He does have some dyspnea but I think this probably weight and deconditioning. I will check a BNP level and might adjust his diuretics. We talked about salt restriction. Today I'm not making any change his medicines.  Atrial fibrillation He has had no recurrent dysrhythmias since stopping ETOH.  CAD This is nonobstructive.  He will continue to be managed with risk reduction.  HTN The blood pressure is at target. No change in medications is indicated. We will continue with therapeutic lifestyle changes (TLC).  CAROTID BRUIT He has high-grade carotid stenosis and being considered for endarterectomy. He would be at acceptable risk for this surgery according to ACC/AHA guidelines.  PVD He has had symptom relief.   We will continue with risk reduction.

## 2014-11-11 NOTE — Patient Instructions (Signed)
The current medical regimen is effective;  continue present plan and medications.  Please have blood work today ar [facility]. (BNP)  Follow up in 6 months with Dr. Antoine Poche in [place].  You will receive a letter in the mail 2 months before you are due.  Please call us when you receive this letter to schedule your follow up appointment.  Thank you for choosing [facility]!!

## 2014-11-12 LAB — BRAIN NATRIURETIC PEPTIDE: BNP: 42.9 pg/mL (ref 0.0–100.0)

## 2014-11-13 ENCOUNTER — Encounter: Payer: Self-pay | Admitting: Surgery

## 2014-11-16 ENCOUNTER — Other Ambulatory Visit: Payer: Self-pay

## 2014-11-16 ENCOUNTER — Ambulatory Visit (INDEPENDENT_AMBULATORY_CARE_PROVIDER_SITE_OTHER): Payer: Medicare (Managed Care) | Admitting: Surgery

## 2014-11-16 ENCOUNTER — Encounter: Payer: Self-pay | Admitting: Surgery

## 2014-11-16 VITALS — BP 121/70 | HR 65 | Wt 162.0 lb

## 2014-11-16 DIAGNOSIS — I6523 Occlusion and stenosis of bilateral carotid arteries: Secondary | ICD-10-CM

## 2014-11-16 NOTE — Progress Notes (Signed)
   Patient name: Ronnie Harvey MRN: [id number] DOB: 09/18/1945 Sex: male     Chief Complaint  Patient presents with  . Re-evaluation    1-2 wk f/u discuss Lt CEA    HISTORY OF PRESENT ILLNESS: The patient is back today for follow-up of his carotid occlusive disease.  He remained asymptomatic.  He denies numbness or weakness in either extremity.  He denies slurred speech.  He denies amaurosis fugax.  He recently underwent angiography to further define the stenosis in his left carotid artery which was felt to be very high and associated with a kink.  Angiography revealed a 90% stenosis.    The patient suffers from peripheral vascular disease and has undergone iliac stenting bilaterally. The patient was hospitalized in December 2013 for diverticulitis with microperforation. He ended up with respiratory failure and intubation. He has undergone cardiac catheterization which shows a 40% LAD stenosis 30% circumflexstenosis 40-50 percent right coronary stenosis with an ejection fraction of 20%. On subsequent imaging his ejection fraction has increased slightly to about 30%.  The patient suffers from hypercholesterolemia which is managed with a statin. His hypertension is well controlled with an angiotensin receptorl blocker. He is on aspirin for antiplatelet measures.he quit smoking in 2012.  Past Medical History  Diagnosis Date  . ETOH abuse     Quit 1998  . Tobacco abuse     Quit Dec 2012  . Cardiomyopathy, dilated     a. 09/2012 Echo: EF 15-20%, diff HK, gr2 DD, Triv AI, Mod MR, mild TR/PR, mod-sev dil LA.  . PAF (paroxysmal atrial fibrillation)   . PVD (peripheral vascular disease)     a. 05/2013 s/p PTA/self-expanding stenting of the REIA and LCIA.  . CAD (coronary artery disease) nonobstructive    a. 10/2012 Cath: LM 50, LAD 25p, 40m, Diag 25m, LCX 30p, 40d, OM1 60-70, OM3 30, RCA 40-50m, AM 70p, PDA nl, EF 20%.  . CHF (congestive heart failure)   . Myocardial infarction      Past Surgical History  Procedure Laterality Date  . Neck cyst resection    . Vasectomy    . Abdominal aortagram N/A 05/28/2013    Procedure: ABDOMINAL AORTAGRAM;  Surgeon: Muhammad A Arida, MD;  Location: [facility] CATH LAB;  Service: Cardiovascular;  Laterality: N/A;  . Percutaneous stent intervention Right 05/28/2013    Procedure: PERCUTANEOUS STENT INTERVENTION;  Surgeon: Muhammad A Arida, MD;  Location: [facility] CATH LAB;  Service: Cardiovascular;  Laterality: Right;  . Carotid angiogram Bilateral 10/27/2014    Procedure: CAROTID ANGIOGRAM;  Surgeon: Sharrieff Spratlin W Arnetra Terris, MD;  Location: [facility] CATH LAB;  Service: Cardiovascular;  Laterality: Bilateral;    History   Social History  . Marital Status: Married    Spouse Name: N/A  . Number of Children: N/A  . Years of Education: N/A   Occupational History  . Not on file.   Social History Main Topics  . Smoking status: Former Smoker -- 1.00 packs/day for 49 years    Types: Cigarettes    Start date: 08/30/2012  . Smokeless tobacco: Not on file  . Alcohol Use: No     Comment: Former ETOH.  None in past 15 years.   . Drug Use: No  . Sexual Activity: Not on file   Other Topics Concern  . Not on file   Social History Narrative    Family History  Problem Relation Age of Onset  . Diabetes Mother   . Diabetes Father   .   Diabetes Brother     Allergies as of 11/16/2014  . (No Known Allergies)    Current Outpatient Prescriptions on File Prior to Visit  Medication Sig Dispense Refill  . aspirin EC 81 MG tablet Take 1 tablet (81 mg total) by mouth daily.    . atorvastatin (LIPITOR) 40 MG tablet Take 40mg by mouth daily 90 tablet 3  . carvedilol (COREG) 25 MG tablet TAKE 1 TABLET TWICE A DAY WITH MEALS (Patient taking differently: Take 25mg by mouth twice daily with meals) 180 tablet 3  . furosemide (LASIX) 20 MG tablet Take 1 tablet (20 mg total) by mouth daily. 90 tablet 3  . losartan (COZAAR) 50 MG tablet TAKE 1 TABLET TWICE A DAY (Patient  taking differently: Take 50 mg by mouth 2 (two) times daily. ) 180 tablet 1  . spironolactone (ALDACTONE) 50 MG tablet Take 1 tablet (50 mg total) by mouth daily. 90 tablet 3   No current facility-administered medications on file prior to visit.     REVIEW OF SYSTEMS: No changes from visit on 10/12/2014  PHYSICAL EXAMINATION:   Vital signs are BP 121/70 mmHg  Pulse 65  Wt 162 lb (73.483 kg)  SpO2 98% General: The patient appears their stated age. HEENT:  No gross abnormalities Pulmonary:  Non labored breathing Musculoskeletal: There are no major deformities. Neurologic: No focal weakness or paresthesias are detected, Skin: There are no ulcer or rashes noted. Psychiatric: The patient has normal affect. Cardiovascular: There is a regular rate and rhythm without significant murmur appreciated.  Patient is able to hyperextend his neck and rotated.  Diagnostic Studies Angiography reveals 90% left carotid stenosis Right side is 60% Assessment: Asymptomatic left carotid stenosis Plan: I again discussed our treatment options.  We discussed pertussis patient in CREST II versus endarterectomy.  The patient would like to proceed with endarterectomy.  We discussed the risks and benefits of the operation including the risk of stroke, nerve injury, facial numbness, trouble swallowing.  The patient wants to go ahead and get this done.  I have scheduled his operation which will be a left carotid endarterectomy for Thursday, March 17  V. Wells Rashaud Ybarbo IV, M.D. [facility] Office: [phone] Pager:  [phone]    

## 2014-11-25 ENCOUNTER — Encounter (HOSPITAL_COMMUNITY): Payer: Self-pay

## 2014-11-25 ENCOUNTER — Encounter (HOSPITAL_COMMUNITY)
Admission: RE | Admit: 2014-11-25 | Discharge: 2014-11-25 | Disposition: A | Discharge status: Home / Self Care | Payer: Medicare Other | Source: Ambulatory Visit | Attending: Surgery | Admitting: Surgery

## 2014-11-25 ENCOUNTER — Other Ambulatory Visit (HOSPITAL_COMMUNITY): Payer: Self-pay | Admitting: *Deleted

## 2014-11-25 DIAGNOSIS — Z01812 Encounter for preprocedural laboratory examination: Secondary | ICD-10-CM | POA: Diagnosis present

## 2014-11-25 DIAGNOSIS — I6522 Occlusion and stenosis of left carotid artery: Secondary | ICD-10-CM | POA: Diagnosis not present

## 2014-11-25 HISTORY — DX: Round hole, right eye: H33.321

## 2014-11-25 HISTORY — DX: Gastro-esophageal reflux disease without esophagitis: K21.9

## 2014-11-25 HISTORY — DX: Reserved for inherently not codable concepts without codable children: IMO0001

## 2014-11-25 HISTORY — DX: Unspecified osteoarthritis, unspecified site: M19.90

## 2014-11-25 HISTORY — DX: Diverticulitis of intestine, part unspecified, without perforation or abscess without bleeding: K57.92

## 2014-11-25 HISTORY — DX: Pruritus, unspecified: L29.9

## 2014-11-25 LAB — COMPREHENSIVE METABOLIC PANEL
ALBUMIN: 4.1 g/dL (ref 3.5–5.2)
ALK PHOS: 97 U/L (ref 39–117)
ALT: 17 U/L (ref 0–53)
AST: 23 U/L (ref 0–37)
Anion gap: 10 (ref 5–15)
BUN: 29 mg/dL — AB (ref 6–23)
CALCIUM: 9.6 mg/dL (ref 8.4–10.5)
CO2: 25 mmol/L (ref 19–32)
CREATININE: 1.28 mg/dL (ref 0.50–1.35)
Chloride: 105 mmol/L (ref 96–112)
GFR calc Af Amer: 64 mL/min — ABNORMAL LOW (ref >90)
GFR calc non Af Amer: 55 mL/min — ABNORMAL LOW (ref >90)
Glucose, Bld: 113 mg/dL — ABNORMAL HIGH (ref 70–99)
Potassium: 4.4 mmol/L (ref 3.5–5.1)
SODIUM: 140 mmol/L (ref 135–145)
Total Bilirubin: 0.9 mg/dL (ref 0.3–1.2)
Total Protein: 8.2 g/dL (ref 6.0–8.3)

## 2014-11-25 LAB — CBC
HCT: 43.8 % (ref 39.0–52.0)
HEMOGLOBIN: 14.7 g/dL (ref 13.0–17.0)
MCH: 32 pg (ref 26.0–34.0)
MCHC: 33.6 g/dL (ref 30.0–36.0)
MCV: 95.2 fL (ref 78.0–100.0)
Platelets: 212 10*3/uL (ref 150–400)
RBC: 4.6 MIL/uL (ref 4.22–5.81)
RDW: 12 % (ref 11.5–15.5)
WBC: 10.4 10*3/uL (ref 4.0–10.5)

## 2014-11-25 LAB — ABO/RH: ABO/RH(D): A POS

## 2014-11-25 LAB — APTT: aPTT: 32 seconds (ref 24–37)

## 2014-11-25 LAB — URINALYSIS, ROUTINE W REFLEX MICROSCOPIC
Bilirubin Urine: NEGATIVE
GLUCOSE, UA: NEGATIVE mg/dL
Hgb urine dipstick: NEGATIVE
KETONES UR: NEGATIVE mg/dL
LEUKOCYTES UA: NEGATIVE
Nitrite: NEGATIVE
PH: 5.5 (ref 5.0–8.0)
PROTEIN: NEGATIVE mg/dL
Specific Gravity, Urine: 1.01 (ref 1.005–1.030)
UROBILINOGEN UA: 0.2 mg/dL (ref 0.0–1.0)

## 2014-11-25 LAB — SURGICAL PCR SCREEN
MRSA, PCR: NEGATIVE
Staphylococcus aureus: POSITIVE — AB

## 2014-11-25 LAB — PROTIME-INR
INR: 1.06 (ref 0.00–1.49)
Prothrombin Time: 13.9 seconds (ref 11.6–15.2)

## 2014-11-25 LAB — TYPE AND SCREEN
ABO/RH(D): A POS
Antibody Screen: NEGATIVE

## 2014-11-25 NOTE — Pre-Procedure Instructions (Signed)
Ronnie Harvey  11/25/2014   Your procedure is scheduled on:  Thursday, December 03, 2014 at 10:50 AM.   Report to [facility] Entrance "A" Admitting Office at 8:45 AM.   Call this number if you have problems the morning of surgery: [phone]               Any questions prior to day of surgery, please call [phone] between 8 & 4 PM.   Remember:   Do not eat food or drink liquids after midnight Wednesday, 12/02/14.   Take these medicines the morning of surgery with A SIP OF WATER: Aspirin, Carvedilol (Coreg)   Do not wear jewelry.  Do not wear lotions, powders, or cologne. You may wear deodorant.  Men may shave face and neck.  Do not bring valuables to the hospital.  [facility] is not responsible                  for any belongings or valuables.               Contacts, dentures or bridgework may not be worn into surgery.  Leave suitcase in the car. After surgery it may be brought to your room.  For patients admitted to the hospital, discharge time is determined by your                treatment team.               Special Instructions: [facility] - Preparing for Surgery  Before surgery, you can play an important role.  Because skin is not sterile, your skin needs to be as free of germs as possible.  You can reduce the number of germs on you skin by washing with CHG (chlorahexidine gluconate) soap before surgery.  CHG is an antiseptic cleaner which kills germs and bonds with the skin to continue killing germs even after washing.  Please DO NOT use if you have an allergy to CHG or antibacterial soaps.  If your skin becomes reddened/irritated stop using the CHG and inform your nurse when you arrive at Short Stay.  Do not shave (including legs and underarms) for at least 48 hours prior to the first CHG shower.  You may shave your face.  Please follow these instructions carefully:   1.  Shower with CHG Soap the night before surgery and the                                 morning of Surgery.  2.  If you choose to wash your hair, wash your hair first as usual with your       normal shampoo.  3.  After you shampoo, rinse your hair and body thoroughly to remove the                      Shampoo.  4.  Use CHG as you would any other liquid soap.  You can apply chg directly       to the skin and wash gently with scrungie or a clean washcloth.  5.  Apply the CHG Soap to your body ONLY FROM THE NECK DOWN.        Do not use on open wounds or open sores.  Avoid contact with your eyes, ears, mouth and genitals (private parts).  Wash genitals (private parts) with your normal soap.  6.  Wash thoroughly,  paying special attention to the area where your surgery        will be performed.  7.  Thoroughly rinse your body with warm water from the neck down.  8.  DO NOT shower/wash with your normal soap after using and rinsing off       the CHG Soap.  9.  Pat yourself dry with a clean towel.            10.  Wear clean pajamas.            11.  Place clean sheets on your bed the night of your first shower and do not        sleep with pets.  Day of Surgery  Do not apply any lotions the morning of surgery.  Please wear clean clothes to the hospital.     Please read over the following fact sheets that you were given: Pain Booklet, Coughing and Deep Breathing, Blood Transfusion Information, MRSA Information and Surgical Site Infection Prevention

## 2014-11-25 NOTE — Progress Notes (Signed)
Mupirocin Ointment Rx called into [facility] in [place] for positive PCR of staph. Pt notified and voiced understanding.

## 2014-11-30 ENCOUNTER — Ambulatory Visit (INDEPENDENT_AMBULATORY_CARE_PROVIDER_SITE_OTHER): Payer: BLUE CROSS/BLUE SHIELD | Admitting: Ophthalmology

## 2014-11-30 DIAGNOSIS — H43813 Vitreous degeneration, bilateral: Secondary | ICD-10-CM | POA: Diagnosis not present

## 2014-11-30 DIAGNOSIS — I10 Essential (primary) hypertension: Secondary | ICD-10-CM

## 2014-11-30 DIAGNOSIS — H35033 Hypertensive retinopathy, bilateral: Secondary | ICD-10-CM

## 2014-11-30 DIAGNOSIS — H26491 Other secondary cataract, right eye: Secondary | ICD-10-CM

## 2014-11-30 DIAGNOSIS — H2512 Age-related nuclear cataract, left eye: Secondary | ICD-10-CM

## 2014-11-30 DIAGNOSIS — H35341 Macular cyst, hole, or pseudohole, right eye: Secondary | ICD-10-CM

## 2014-11-30 DIAGNOSIS — H35371 Puckering of macula, right eye: Secondary | ICD-10-CM

## 2014-12-02 MED ORDER — SODIUM CHLORIDE 0.9 % IV SOLN: INTRAVENOUS | Status: DC

## 2014-12-02 MED ORDER — CHLORHEXIDINE GLUCONATE CLOTH 2 % EX PADS: 6.0000 | MEDICATED_PAD | Freq: Once | CUTANEOUS | Status: DC

## 2014-12-02 MED ORDER — DEXTROSE 5 % IV SOLN
1.5000 g | INTRAVENOUS | Status: AC
  Administered 2014-12-03: 1.5 g via INTRAVENOUS
  Filled 2014-12-02: qty 1.5

## 2014-12-03 ENCOUNTER — Encounter (HOSPITAL_COMMUNITY): Payer: Self-pay | Admitting: *Deleted

## 2014-12-03 ENCOUNTER — Inpatient Hospital Stay (HOSPITAL_COMMUNITY): Payer: Medicare Other | Admitting: Certified Registered"

## 2014-12-03 ENCOUNTER — Encounter (HOSPITAL_COMMUNITY)
Admission: RE | Disposition: A | Discharge status: Home Health | Payer: Self-pay | Source: Ambulatory Visit | Attending: Surgery

## 2014-12-03 ENCOUNTER — Inpatient Hospital Stay (HOSPITAL_COMMUNITY)
Admission: RE | Admit: 2014-12-03 | Discharge: 2014-12-04 | DRG: 038 | Disposition: A | Discharge status: Home Health | Payer: Medicare Other | Source: Ambulatory Visit | Attending: Surgery | Admitting: Surgery

## 2014-12-03 DIAGNOSIS — I420 Dilated cardiomyopathy: Secondary | ICD-10-CM | POA: Diagnosis present

## 2014-12-03 DIAGNOSIS — Z95820 Peripheral vascular angioplasty status with implants and grafts: Secondary | ICD-10-CM

## 2014-12-03 DIAGNOSIS — R29810 Facial weakness: Secondary | ICD-10-CM | POA: Diagnosis not present

## 2014-12-03 DIAGNOSIS — Z87891 Personal history of nicotine dependence: Secondary | ICD-10-CM | POA: Diagnosis not present

## 2014-12-03 DIAGNOSIS — I10 Essential (primary) hypertension: Secondary | ICD-10-CM | POA: Diagnosis present

## 2014-12-03 DIAGNOSIS — I509 Heart failure, unspecified: Secondary | ICD-10-CM | POA: Diagnosis present

## 2014-12-03 DIAGNOSIS — I739 Peripheral vascular disease, unspecified: Secondary | ICD-10-CM | POA: Diagnosis present

## 2014-12-03 DIAGNOSIS — Z7982 Long term (current) use of aspirin: Secondary | ICD-10-CM

## 2014-12-03 DIAGNOSIS — I252 Old myocardial infarction: Secondary | ICD-10-CM

## 2014-12-03 DIAGNOSIS — I6529 Occlusion and stenosis of unspecified carotid artery: Secondary | ICD-10-CM | POA: Diagnosis present

## 2014-12-03 DIAGNOSIS — Z79899 Other long term (current) drug therapy: Secondary | ICD-10-CM

## 2014-12-03 DIAGNOSIS — E780 Pure hypercholesterolemia: Secondary | ICD-10-CM | POA: Diagnosis present

## 2014-12-03 DIAGNOSIS — I6522 Occlusion and stenosis of left carotid artery: Principal | ICD-10-CM | POA: Diagnosis present

## 2014-12-03 DIAGNOSIS — I2510 Atherosclerotic heart disease of native coronary artery without angina pectoris: Secondary | ICD-10-CM | POA: Diagnosis present

## 2014-12-03 DIAGNOSIS — I480 Paroxysmal atrial fibrillation: Secondary | ICD-10-CM | POA: Diagnosis present

## 2014-12-03 DIAGNOSIS — R0902 Hypoxemia: Secondary | ICD-10-CM

## 2014-12-03 HISTORY — PX: ENDARTERECTOMY: SHX5162

## 2014-12-03 HISTORY — PX: CAROTID ENDARTERECTOMY: SUR193

## 2014-12-03 LAB — CREATININE, SERUM
Creatinine, Ser: 1.27 mg/dL (ref 0.50–1.35)
GFR calc Af Amer: 65 mL/min — ABNORMAL LOW (ref >90)
GFR calc non Af Amer: 56 mL/min — ABNORMAL LOW (ref >90)

## 2014-12-03 LAB — CBC
HCT: 37.5 % — ABNORMAL LOW (ref 39.0–52.0)
Hemoglobin: 12.7 g/dL — ABNORMAL LOW (ref 13.0–17.0)
MCH: 31.6 pg (ref 26.0–34.0)
MCHC: 33.9 g/dL (ref 30.0–36.0)
MCV: 93.3 fL (ref 78.0–100.0)
Platelets: 198 K/uL (ref 150–400)
RBC: 4.02 MIL/uL — ABNORMAL LOW (ref 4.22–5.81)
RDW: 11.9 % (ref 11.5–15.5)
WBC: 14 K/uL — ABNORMAL HIGH (ref 4.0–10.5)

## 2014-12-03 SURGERY — ENDARTERECTOMY, CAROTID
Anesthesia: General | Site: Neck | Laterality: Left

## 2014-12-03 MED ORDER — SENNOSIDES-DOCUSATE SODIUM 8.6-50 MG PO TABS
1.0000 | ORAL_TABLET | Freq: Every evening | ORAL | Status: DC | PRN
  Filled 2014-12-03: qty 1

## 2014-12-03 MED ORDER — HEMOSTATIC AGENTS (NO CHARGE) OPTIME
TOPICAL | Status: DC | PRN
  Administered 2014-12-03: 1

## 2014-12-03 MED ORDER — DOCUSATE SODIUM 100 MG PO CAPS
100.0000 mg | ORAL_CAPSULE | Freq: Every day | ORAL | Status: DC
  Administered 2014-12-04: 100 mg via ORAL
  Filled 2014-12-03: qty 1

## 2014-12-03 MED ORDER — FENTANYL CITRATE 0.05 MG/ML IJ SOLN
INTRAMUSCULAR | Status: AC
  Filled 2014-12-03: qty 5

## 2014-12-03 MED ORDER — LACTATED RINGERS IV SOLN
Freq: Once | INTRAVENOUS | Status: AC
  Administered 2014-12-03: 09:00:00 via INTRAVENOUS

## 2014-12-03 MED ORDER — ARTIFICIAL TEARS OP OINT
TOPICAL_OINTMENT | OPHTHALMIC | Status: DC | PRN
Start: 2014-12-03 — End: 2014-12-03
  Administered 2014-12-03: 1 via OPHTHALMIC

## 2014-12-03 MED ORDER — METOPROLOL TARTRATE 1 MG/ML IV SOLN: 2.0000 mg | INTRAVENOUS | Status: DC | PRN

## 2014-12-03 MED ORDER — ROCURONIUM BROMIDE 50 MG/5ML IV SOLN
INTRAVENOUS | Status: AC
  Filled 2014-12-03: qty 1

## 2014-12-03 MED ORDER — EPHEDRINE SULFATE 50 MG/ML IJ SOLN
INTRAMUSCULAR | Status: AC
  Filled 2014-12-03: qty 1

## 2014-12-03 MED ORDER — LACTATED RINGERS IV SOLN
INTRAVENOUS | Status: DC | PRN
  Administered 2014-12-03: 10:00:00 via INTRAVENOUS

## 2014-12-03 MED ORDER — NEOSTIGMINE METHYLSULFATE 10 MG/10ML IV SOLN
INTRAVENOUS | Status: DC | PRN
  Administered 2014-12-03: 2 mg via INTRAVENOUS

## 2014-12-03 MED ORDER — FENTANYL CITRATE 0.05 MG/ML IJ SOLN
INTRAMUSCULAR | Status: DC | PRN
  Administered 2014-12-03: 100 ug via INTRAVENOUS
  Administered 2014-12-03: 50 ug via INTRAVENOUS

## 2014-12-03 MED ORDER — HYDRALAZINE HCL 20 MG/ML IJ SOLN: 5.0000 mg | INTRAMUSCULAR | Status: DC | PRN

## 2014-12-03 MED ORDER — PHENOL 1.4 % MT LIQD: 1.0000 | OROMUCOSAL | Status: DC | PRN

## 2014-12-03 MED ORDER — LABETALOL HCL 5 MG/ML IV SOLN
INTRAVENOUS | Status: DC | PRN
  Administered 2014-12-03 (×2): 10 mg via INTRAVENOUS

## 2014-12-03 MED ORDER — SODIUM CHLORIDE 0.9 % IJ SOLN
INTRAMUSCULAR | Status: AC
  Filled 2014-12-03: qty 10

## 2014-12-03 MED ORDER — GUAIFENESIN-DM 100-10 MG/5ML PO SYRP: 15.0000 mL | ORAL_SOLUTION | ORAL | Status: DC | PRN

## 2014-12-03 MED ORDER — MORPHINE SULFATE 2 MG/ML IJ SOLN
2.0000 mg | INTRAMUSCULAR | Status: DC | PRN
  Administered 2014-12-03: 2 mg via INTRAVENOUS

## 2014-12-03 MED ORDER — PANTOPRAZOLE SODIUM 40 MG PO TBEC
40.0000 mg | DELAYED_RELEASE_TABLET | Freq: Every day | ORAL | Status: DC
  Administered 2014-12-04: 40 mg via ORAL
  Filled 2014-12-03: qty 1

## 2014-12-03 MED ORDER — LOSARTAN POTASSIUM 50 MG PO TABS
50.0000 mg | ORAL_TABLET | Freq: Two times a day (BID) | ORAL | Status: DC
  Administered 2014-12-04: 50 mg via ORAL
  Filled 2014-12-03 (×3): qty 1

## 2014-12-03 MED ORDER — LIDOCAINE HCL (CARDIAC) 20 MG/ML IV SOLN
INTRAVENOUS | Status: DC | PRN
  Administered 2014-12-03: 80 mg via INTRAVENOUS

## 2014-12-03 MED ORDER — GLYCOPYRROLATE 0.2 MG/ML IJ SOLN
INTRAMUSCULAR | Status: AC
  Filled 2014-12-03: qty 1

## 2014-12-03 MED ORDER — ATORVASTATIN CALCIUM 40 MG PO TABS
40.0000 mg | ORAL_TABLET | Freq: Every day | ORAL | Status: DC
  Filled 2014-12-03 (×2): qty 1

## 2014-12-03 MED ORDER — PHENYLEPHRINE HCL 10 MG/ML IJ SOLN
10.0000 mg | INTRAVENOUS | Status: DC | PRN
  Administered 2014-12-03: 50 ug/min via INTRAVENOUS

## 2014-12-03 MED ORDER — SODIUM CHLORIDE 0.9 % IV SOLN
INTRAVENOUS | Status: DC
  Administered 2014-12-03: 19:00:00 via INTRAVENOUS

## 2014-12-03 MED ORDER — ALUM & MAG HYDROXIDE-SIMETH 200-200-20 MG/5ML PO SUSP: 15.0000 mL | ORAL | Status: DC | PRN

## 2014-12-03 MED ORDER — ENOXAPARIN SODIUM 30 MG/0.3ML ~~LOC~~ SOLN
30.0000 mg | SUBCUTANEOUS | Status: DC
  Administered 2014-12-04: 30 mg via SUBCUTANEOUS
  Filled 2014-12-03 (×2): qty 0.3

## 2014-12-03 MED ORDER — ROCURONIUM BROMIDE 100 MG/10ML IV SOLN
INTRAVENOUS | Status: DC | PRN
  Administered 2014-12-03: 40 mg via INTRAVENOUS

## 2014-12-03 MED ORDER — PROPOFOL 10 MG/ML IV BOLUS
INTRAVENOUS | Status: DC | PRN
  Administered 2014-12-03: 40 mg via INTRAVENOUS
  Administered 2014-12-03: 100 mg via INTRAVENOUS
  Administered 2014-12-03 (×2): 10 mg via INTRAVENOUS

## 2014-12-03 MED ORDER — ASPIRIN EC 81 MG PO TBEC
81.0000 mg | DELAYED_RELEASE_TABLET | Freq: Every day | ORAL | Status: DC
  Administered 2014-12-04: 81 mg via ORAL
  Filled 2014-12-03: qty 1

## 2014-12-03 MED ORDER — PROPOFOL 10 MG/ML IV BOLUS
INTRAVENOUS | Status: AC
  Filled 2014-12-03: qty 20

## 2014-12-03 MED ORDER — PROTAMINE SULFATE 10 MG/ML IV SOLN
INTRAVENOUS | Status: DC | PRN
  Administered 2014-12-03 (×5): 10 mg via INTRAVENOUS

## 2014-12-03 MED ORDER — ONDANSETRON HCL 4 MG/2ML IJ SOLN
INTRAMUSCULAR | Status: AC
  Filled 2014-12-03: qty 2

## 2014-12-03 MED ORDER — LABETALOL HCL 5 MG/ML IV SOLN: 10.0000 mg | INTRAVENOUS | Status: DC | PRN

## 2014-12-03 MED ORDER — SODIUM CHLORIDE 0.9 % IV SOLN: 500.0000 mL | Freq: Once | INTRAVENOUS | Status: AC | PRN

## 2014-12-03 MED ORDER — HEPARIN SODIUM (PORCINE) 1000 UNIT/ML IJ SOLN
INTRAMUSCULAR | Status: AC
  Filled 2014-12-03: qty 1

## 2014-12-03 MED ORDER — POTASSIUM CHLORIDE CRYS ER 20 MEQ PO TBCR: 20.0000 meq | EXTENDED_RELEASE_TABLET | Freq: Every day | ORAL | Status: DC | PRN

## 2014-12-03 MED ORDER — GLYCOPYRROLATE 0.2 MG/ML IJ SOLN
INTRAMUSCULAR | Status: DC | PRN
  Administered 2014-12-03: 0.3 mg via INTRAVENOUS
  Administered 2014-12-03: 0.2 mg via INTRAVENOUS

## 2014-12-03 MED ORDER — LIDOCAINE HCL (CARDIAC) 20 MG/ML IV SOLN
INTRAVENOUS | Status: AC
  Filled 2014-12-03: qty 5

## 2014-12-03 MED ORDER — ONDANSETRON HCL 4 MG/2ML IJ SOLN
INTRAMUSCULAR | Status: DC | PRN
  Administered 2014-12-03: 4 mg via INTRAVENOUS

## 2014-12-03 MED ORDER — CALCIUM CHLORIDE 10 % IV SOLN
INTRAVENOUS | Status: DC | PRN
  Administered 2014-12-03 (×3): 100 mg via INTRAVENOUS

## 2014-12-03 MED ORDER — LIDOCAINE HCL 4 % MT SOLN
OROMUCOSAL | Status: DC | PRN
  Administered 2014-12-03: 5 mL via TOPICAL

## 2014-12-03 MED ORDER — SPIRONOLACTONE 50 MG PO TABS
50.0000 mg | ORAL_TABLET | Freq: Every day | ORAL | Status: DC
  Administered 2014-12-04: 50 mg via ORAL
  Filled 2014-12-03 (×2): qty 1

## 2014-12-03 MED ORDER — OXYCODONE-ACETAMINOPHEN 5-325 MG PO TABS
1.0000 | ORAL_TABLET | ORAL | Status: DC | PRN
  Administered 2014-12-03 – 2014-12-04 (×3): 2 via ORAL
  Filled 2014-12-03 (×3): qty 2

## 2014-12-03 MED ORDER — 0.9 % SODIUM CHLORIDE (POUR BTL) OPTIME
TOPICAL | Status: DC | PRN
  Administered 2014-12-03: 2000 mL

## 2014-12-03 MED ORDER — ONDANSETRON HCL 4 MG/2ML IJ SOLN: 4.0000 mg | Freq: Four times a day (QID) | INTRAMUSCULAR | Status: DC | PRN

## 2014-12-03 MED ORDER — DIFLUPREDNATE 0.05 % OP EMUL
1.0000 [drp] | Freq: Two times a day (BID) | OPHTHALMIC | Status: DC
  Filled 2014-12-03: qty 5

## 2014-12-03 MED ORDER — HEPARIN SODIUM (PORCINE) 5000 UNIT/ML IJ SOLN
INTRAMUSCULAR | Status: DC | PRN
  Administered 2014-12-03: 12:00:00

## 2014-12-03 MED ORDER — CARVEDILOL 25 MG PO TABS
25.0000 mg | ORAL_TABLET | Freq: Two times a day (BID) | ORAL | Status: DC
  Administered 2014-12-04: 25 mg via ORAL
  Filled 2014-12-03 (×4): qty 1

## 2014-12-03 MED ORDER — MORPHINE SULFATE 4 MG/ML IJ SOLN
INTRAMUSCULAR | Status: AC
  Filled 2014-12-03: qty 1

## 2014-12-03 MED ORDER — FUROSEMIDE 20 MG PO TABS
20.0000 mg | ORAL_TABLET | Freq: Every day | ORAL | Status: DC
  Administered 2014-12-04: 20 mg via ORAL
  Filled 2014-12-03 (×2): qty 1

## 2014-12-03 MED ORDER — ARTIFICIAL TEARS OP OINT
TOPICAL_OINTMENT | OPHTHALMIC | Status: AC
  Filled 2014-12-03: qty 3.5

## 2014-12-03 MED ORDER — ACETAMINOPHEN 325 MG PO TABS: 325.0000 mg | ORAL_TABLET | ORAL | Status: DC | PRN

## 2014-12-03 MED ORDER — ACETAMINOPHEN 650 MG RE SUPP: 325.0000 mg | RECTAL | Status: DC | PRN

## 2014-12-03 MED ORDER — HEPARIN SODIUM (PORCINE) 1000 UNIT/ML IJ SOLN
INTRAMUSCULAR | Status: DC | PRN
  Administered 2014-12-03: 3000 [IU] via INTRAVENOUS
  Administered 2014-12-03: 7000 [IU] via INTRAVENOUS

## 2014-12-03 MED ORDER — MAGNESIUM SULFATE 2 GM/50ML IV SOLN: 2.0000 g | Freq: Every day | INTRAVENOUS | Status: DC | PRN

## 2014-12-03 MED ORDER — BISACODYL 10 MG RE SUPP: 10.0000 mg | Freq: Every day | RECTAL | Status: DC | PRN

## 2014-12-03 MED ORDER — DEXTROSE 5 % IV SOLN
1.5000 g | Freq: Two times a day (BID) | INTRAVENOUS | Status: AC
  Administered 2014-12-03 – 2014-12-04 (×2): 1.5 g via INTRAVENOUS
  Filled 2014-12-03 (×2): qty 1.5

## 2014-12-03 SURGICAL SUPPLY — 50 items
CANISTER SUCTION 2500CC (MISCELLANEOUS) ×3 IMPLANT
CATH ROBINSON RED A/P 18FR (CATHETERS) ×3 IMPLANT
CATH SUCT 10FR WHISTLE TIP (CATHETERS) ×3 IMPLANT
CLIP TI MEDIUM 6 (CLIP) ×3 IMPLANT
CLIP TI WIDE RED SMALL 6 (CLIP) ×6 IMPLANT
CRADLE DONUT ADULT HEAD (MISCELLANEOUS) ×3 IMPLANT
DRAIN CHANNEL 15F RND FF W/TCR (WOUND CARE) IMPLANT
ELECT REM PT RETURN 9FT ADLT (ELECTROSURGICAL) ×3
ELECTRODE REM PT RTRN 9FT ADLT (ELECTROSURGICAL) ×1 IMPLANT
EVACUATOR SILICONE 100CC (DRAIN) IMPLANT
GAUZE SPONGE 4X4 12PLY STRL (GAUZE/BANDAGES/DRESSINGS) ×3 IMPLANT
GLOVE BIO SURGEON STRL SZ 6.5 (GLOVE) ×2 IMPLANT
GLOVE BIO SURGEONS STRL SZ 6.5 (GLOVE) ×1
GLOVE BIOGEL PI IND STRL 6.5 (GLOVE) ×2 IMPLANT
GLOVE BIOGEL PI IND STRL 7.0 (GLOVE) ×2 IMPLANT
GLOVE BIOGEL PI IND STRL 7.5 (GLOVE) ×1 IMPLANT
GLOVE BIOGEL PI INDICATOR 6.5 (GLOVE) ×4
GLOVE BIOGEL PI INDICATOR 7.0 (GLOVE) ×4
GLOVE BIOGEL PI INDICATOR 7.5 (GLOVE) ×2
GLOVE ECLIPSE 6.5 STRL STRAW (GLOVE) ×6 IMPLANT
GLOVE ECLIPSE 7.0 STRL STRAW (GLOVE) ×3 IMPLANT
GLOVE SURG SS PI 7.5 STRL IVOR (GLOVE) ×3 IMPLANT
GOWN STRL REUS W/ TWL LRG LVL3 (GOWN DISPOSABLE) ×4 IMPLANT
GOWN STRL REUS W/ TWL XL LVL3 (GOWN DISPOSABLE) ×1 IMPLANT
GOWN STRL REUS W/TWL LRG LVL3 (GOWN DISPOSABLE) ×8
GOWN STRL REUS W/TWL XL LVL3 (GOWN DISPOSABLE) ×2
HEMOSTAT SNOW SURGICEL 2X4 (HEMOSTASIS) ×3 IMPLANT
INSERT FOGARTY SM (MISCELLANEOUS) IMPLANT
KIT BASIN OR (CUSTOM PROCEDURE TRAY) ×3 IMPLANT
KIT ROOM TURNOVER OR (KITS) ×3 IMPLANT
LIQUID BAND (GAUZE/BANDAGES/DRESSINGS) ×3 IMPLANT
NS IRRIG 1000ML POUR BTL (IV SOLUTION) ×9 IMPLANT
PACK CAROTID (CUSTOM PROCEDURE TRAY) ×3 IMPLANT
PAD ARMBOARD 7.5X6 YLW CONV (MISCELLANEOUS) ×6 IMPLANT
PATCH VASCULAR VASCU GUARD 1X6 (Vascular Products) ×3 IMPLANT
SHUNT CAROTID BYPASS 10 (VASCULAR PRODUCTS) IMPLANT
SHUNT CAROTID BYPASS 12FRX15.5 (VASCULAR PRODUCTS) ×3 IMPLANT
SPONGE INTESTINAL PEANUT (DISPOSABLE) IMPLANT
SUT ETHILON 3 0 PS 1 (SUTURE) IMPLANT
SUT PROLENE 6 0 BV (SUTURE) ×3 IMPLANT
SUT PROLENE 7 0 BV 1 (SUTURE) IMPLANT
SUT PROLENE 7 0 BV1 MDA (SUTURE) ×6 IMPLANT
SUT SILK 3 0 (SUTURE) ×2
SUT SILK 3 0 TIES 17X18 (SUTURE)
SUT SILK 3-0 18XBRD TIE 12 (SUTURE) ×1 IMPLANT
SUT SILK 3-0 18XBRD TIE BLK (SUTURE) IMPLANT
SUT VIC AB 3-0 SH 27 (SUTURE) ×4
SUT VIC AB 3-0 SH 27X BRD (SUTURE) ×2 IMPLANT
SUT VICRYL 4-0 PS2 18IN ABS (SUTURE) ×3 IMPLANT
WATER STERILE IRR 1000ML POUR (IV SOLUTION) ×3 IMPLANT

## 2014-12-03 NOTE — Transfer of Care (Signed)
Immediate Anesthesia Transfer of Care Note  Patient: Ronnie Harvey  Procedure(s) Performed: Procedure(s): ENDARTERECTOMY CAROTID WITH PATCH ANGIOPLASTY (Left)  Patient Location: PACU  Anesthesia Type:General  Level of Consciousness: awake and alert   Airway & Oxygen Therapy: Patient Spontanous Breathing and Patient connected to nasal cannula oxygen  Post-op Assessment: Report given to RN, Patient moving all extremities X 4 and Patient able to stick tongue midline  Post vital signs: Reviewed and stable  Last Vitals:  Filed Vitals:   12/03/14 0914  BP: 154/54  Pulse:   Temp:   Resp:     Complications: No apparent anesthesia complications

## 2014-12-03 NOTE — H&P (View-Only) (Signed)
Patient name: Ronnie Harvey MRN: [id number] DOB: 05/30/1946 Sex: male     Chief Complaint  Patient presents with  . Re-evaluation    1-2 wk f/u discuss Lt CEA    HISTORY OF PRESENT ILLNESS: The patient is back today for follow-up of his carotid occlusive disease.  He remained asymptomatic.  He denies numbness or weakness in either extremity.  He denies slurred speech.  He denies amaurosis fugax.  He recently underwent angiography to further define the stenosis in his left carotid artery which was felt to be very high and associated with a kink.  Angiography revealed a 90% stenosis.    The patient suffers from peripheral vascular disease and has undergone iliac stenting bilaterally. The patient was hospitalized in December 2013 for diverticulitis with microperforation. He ended up with respiratory failure and intubation. He has undergone cardiac catheterization which shows a 40% LAD stenosis 30% circumflexstenosis 40-50 percent right coronary stenosis with an ejection fraction of 20%. On subsequent imaging his ejection fraction has increased slightly to about 30%.  The patient suffers from hypercholesterolemia which is managed with a statin. His hypertension is well controlled with an angiotensin receptorl blocker. He is on aspirin for antiplatelet measures.he quit smoking in 2012.  Past Medical History  Diagnosis Date  . ETOH abuse     Quit 1998  . Tobacco abuse     Quit Dec 2012  . Cardiomyopathy, dilated     a. 09/2012 Echo: EF 15-20%, diff HK, gr2 DD, Triv AI, Mod MR, mild TR/PR, mod-sev dil LA.  [name] PAF (paroxysmal atrial fibrillation)   . PVD (peripheral vascular disease)     a. 05/2013 s/p PTA/self-expanding stenting of the REIA and LCIA.  [name] CAD (coronary artery disease) nonobstructive    a. 10/2012 Cath: LM 50, LAD 25p, 49m, Diag 54m, LCX 30p, 40d, OM1 60-70, OM3 30, RCA 40-63m, AM 70p, PDA nl, EF 20%.  . CHF (congestive heart failure)   . Myocardial infarction      Past Surgical History  Procedure Laterality Date  . Neck cyst resection    . Vasectomy    . Abdominal aortagram N/A 05/28/2013    Procedure: ABDOMINAL Ronny Flurry;  Surgeon: Iran Ouch, MD;  Location: [facility] CATH LAB;  Service: Cardiovascular;  Laterality: N/A;  . Percutaneous stent intervention Right 05/28/2013    Procedure: PERCUTANEOUS STENT INTERVENTION;  Surgeon: Iran Ouch, MD;  Location: [facility] CATH LAB;  Service: Cardiovascular;  Laterality: Right;  . Carotid angiogram Bilateral 10/27/2014    Procedure: CAROTID ANGIOGRAM;  Surgeon: Nada Libman, MD;  Location: [facility] CATH LAB;  Service: Cardiovascular;  Laterality: Bilateral;    History   Social History  . Marital Status: Married    Spouse Name: N/A  . Number of Children: N/A  . Years of Education: N/A   Occupational History  . Not on file.   Social History Main Topics  . Smoking status: Former Smoker -- 1.00 packs/day for 49 years    Types: Cigarettes    Start date: 08/30/2012  . Smokeless tobacco: Not on file  . Alcohol Use: No     Comment: Former ETOH.  None in past 15 years.   . Drug Use: No  . Sexual Activity: Not on file   Other Topics Concern  . Not on file   Social History Narrative    Family History  Problem Relation Age of Onset  . Diabetes Mother   . Diabetes Father   .  Diabetes Brother     Allergies as of 11/16/2014  . (No Known Allergies)    Current Outpatient Prescriptions on File Prior to Visit  Medication Sig Dispense Refill  . aspirin EC 81 MG tablet Take 1 tablet (81 mg total) by mouth daily.    [name] atorvastatin (LIPITOR) 40 MG tablet Take 40mg  by mouth daily 90 tablet 3  . carvedilol (COREG) 25 MG tablet TAKE 1 TABLET TWICE A DAY WITH MEALS (Patient taking differently: Take 25mg  by mouth twice daily with meals) 180 tablet 3  . furosemide (LASIX) 20 MG tablet Take 1 tablet (20 mg total) by mouth daily. 90 tablet 3  . losartan (COZAAR) 50 MG tablet TAKE 1 TABLET TWICE A DAY (Patient  taking differently: Take 50 mg by mouth 2 (two) times daily. ) 180 tablet 1  . spironolactone (ALDACTONE) 50 MG tablet Take 1 tablet (50 mg total) by mouth daily. 90 tablet 3   No current facility-administered medications on file prior to visit.     REVIEW OF SYSTEMS: No changes from visit on 10/12/2014  PHYSICAL EXAMINATION:   Vital signs are BP 121/70 mmHg  Pulse 65  Wt 162 lb (73.483 kg)  SpO2 98% General: The patient appears their stated age. HEENT:  No gross abnormalities Pulmonary:  Non labored breathing Musculoskeletal: There are no major deformities. Neurologic: No focal weakness or paresthesias are detected, Skin: There are no ulcer or rashes noted. Psychiatric: The patient has normal affect. Cardiovascular: There is a regular rate and rhythm without significant murmur appreciated.  Patient is able to hyperextend his neck and rotated.  Diagnostic Studies Angiography reveals 90% left carotid stenosis Right side is 60% Assessment: Asymptomatic left carotid stenosis Plan: I again discussed our treatment options.  We discussed pertussis patient in CREST II versus endarterectomy.  The patient would like to proceed with endarterectomy.  We discussed the risks and benefits of the operation including the risk of stroke, nerve injury, facial numbness, trouble swallowing.  The patient wants to go ahead and get this done.  I have scheduled his operation which will be a left carotid endarterectomy for Thursday, March 17  V. Charlena Cross, M.D. [facility] Office: [phone] Pager:  [phone]

## 2014-12-03 NOTE — Anesthesia Preprocedure Evaluation (Addendum)
Anesthesia Evaluation  Patient identified by MRN, date of birth, ID band  Reviewed: Allergy & Precautions, NPO status , Patient's Chart, lab work & pertinent test results, reviewed documented beta blocker date and time   Airway Mallampati: I  TM Distance: >3 FB Neck ROM: Full    Dental  (+) Teeth Intact, Missing, Dental Advisory Given   Pulmonary COPDformer smoker,          Cardiovascular + CAD, + Past MI and + Peripheral Vascular Disease Rhythm:Regular Rate:Normal     Neuro/Psych    GI/Hepatic GERD-  ,  Endo/Other    Renal/GU      Musculoskeletal   Abdominal   Peds  Hematology   Anesthesia Other Findings   Reproductive/Obstetrics                          Anesthesia Physical Anesthesia Plan  ASA: III  Anesthesia Plan: General   Post-op Pain Management:    Induction: Intravenous  Airway Management Planned: Oral ETT  Additional Equipment: Arterial line  Intra-op Plan:   Post-operative Plan: Extubation in OR  Informed Consent:   Plan Discussed with:   Anesthesia Plan Comments:         Anesthesia Quick Evaluation

## 2014-12-03 NOTE — Interval H&P Note (Signed)
History and Physical Interval Note:  12/03/2014 10:00 AM  Ronnie Harvey  has presented today for surgery, with the diagnosis of Left internal carotid artery stenosis I65.22  The various methods of treatment have been discussed with the patient and family. After consideration of risks, benefits and other options for treatment, the patient has consented to  Procedure(s): ENDARTERECTOMY CAROTID (Left) as a surgical intervention .  The patient's history has been reviewed, patient examined, no change in status, stable for surgery.  I have reviewed the patient's chart and labs.  Questions were answered to the patient's satisfaction.     Cherry Turlington IV, V. WELLS

## 2014-12-03 NOTE — Progress Notes (Signed)
  [facility] Progress Note  12/03/2014 2:28 PM Day of Surgery  Subjective:  Left neck sore  Filed Vitals:   12/03/14 1400  BP:   Pulse: 65  Temp:   Resp: 17    Physical Exam: Incisions: Left neck incision intact without hematoma Neuro: tongue midline, slight smile asymmetry,  5/5 grip strength bilaterally. 5/5 strength lower extremities.   CBC    Component Value Date/Time   WBC 10.4 11/25/2014 1057   RBC 4.60 11/25/2014 1057   HGB 14.7 11/25/2014 1057   HCT 43.8 11/25/2014 1057   PLT 212 11/25/2014 1057   MCV 95.2 11/25/2014 1057   MCH 32.0 11/25/2014 1057   MCHC 33.6 11/25/2014 1057   RDW 12.0 11/25/2014 1057   LYMPHSABS 1.9 05/28/2013 0723   MONOABS 1.0 05/28/2013 0723   EOSABS 0.0 05/28/2013 0723   BASOSABS 0.0 05/28/2013 0723    BMET    Component Value Date/Time   NA 140 11/25/2014 1057   NA 139 06/08/2014 0940   K 4.4 11/25/2014 1057   CL 105 11/25/2014 1057   CO2 25 11/25/2014 1057   GLUCOSE 113* 11/25/2014 1057   GLUCOSE 111* 06/08/2014 0940   BUN 29* 11/25/2014 1057   BUN 37* 06/08/2014 0940   CREATININE 1.28 11/25/2014 1057   CREATININE 1.26 03/13/2013 1625   CALCIUM 9.6 11/25/2014 1057   GFRNONAA 55* 11/25/2014 1057   GFRNONAA 58* 03/13/2013 1625   GFRAA 64* 11/25/2014 1057   GFRAA 67 03/13/2013 1625    INR    Component Value Date/Time   INR 1.06 11/25/2014 1057     Intake/Output Summary (Last 24 hours) at 12/03/14 1428 Last data filed at 12/03/14 1253  Gross per 24 hour  Intake      0 ml  Output    100 ml  Net   -100 ml     Assessment:  69 y.o. male is s/p: left carotid endarterectomy Day of Surgery  Plan: -Neuro exam intact.  -Neck without hematoma. -Stable post-operatively. -To 3S when bed available.     Maris Berger, PA-C [facility] Office: [phone] Pager: [phone] 12/03/2014 2:28 PM

## 2014-12-03 NOTE — Op Note (Signed)
Patient name: MAYUR PAOLO MRN: [id number] DOB: May 24, 1946 Sex: male  12/03/2014 Pre-operative Diagnosis: Asymptomatic   left carotid stenosis Post-operative diagnosis:  Same Surgeon:  Jorge Ny Assistants:  Lianne Cure Procedure:   #1: left carotid Endarterectomy with bovine pericardial patch angioplasty   #2: Resection of common carotid artery with primary anastomosis Anesthesia:  General Blood Loss:  See anesthesia record Specimens:  Carotid Plaque to pathology  Findings:  90 %stenosis; Thrombus:  none  Indications:  The patient has been followed for asymptomatic carotid stenosis.  His recent ultrasound suggested a high-grade stenosis but a very high lesion, therefore he underwent angiography which documented a 90% stenosis.  The lesion was at the level of C2 and associated with a kink.  I discussed proceeding with endarterectomy, as I did not feel like he was a candidate for stenting.  Risks and benefits were detailed in the clinic note.  He has not had any interval symptoms.  Procedure:  The patient was identified in the holding area and taken to [facility] OR ROOM 16  The patient was then placed supine on the table.   General endotrachial anesthesia was administered.  The patient was prepped and draped in the usual sterile fashion.  A time out was called and antibiotics were administered.  The incision was made along the anterior border of the left sternocleidomastoid muscle.  Cautery was used to dissect through the subcutaneous tissue.  The platysma muscle was divided with cautery.  The internal jugular vein was exposed along its anterior medial border.  The common facial vein was exposed and then divided between 2-0 silk ties and metal clips.  The common carotid artery was then circumferentially exposed and encircled with an umbilical tape.  The vagus nerve was identified and protected.  Next sharp dissection was used to expose the external carotid artery and the superior  thyroid artery.  The were encircled with a blue vessel loop and a 2-0 silk tie respectively.  Finally, the internal carotid was carefully dissected free.  An umbilical tape was placed around the internal carotid artery distal to the diseased segment.  The hypoglossal nerve was visualized throughout and protected.  The patient was given systemic heparinization.  A bovine carotid patch was selected and prepared on the back table.  A 10 french shunt was also prepared.  After blood pressure readings were appropriate and the heparin had been given time to circulate, the internal carotid artery was occluded with a baby Gregory clamp.  The external and common carotid arteries were then occluded with vascular clamps and the 2-0 tie tightened on the superior thyroid artery.  A #11 blade was used to make an arteriotomy in the common carotid artery.  This was extended with Potts scissors along the anterior and lateral border of the common and internal carotid artery.  Approximately 90% stenosis was identified.  There was no thrombus identified.  The 10 french shunt was not placed, as there was pulsatile back bleeding.  A kleiner kuntz elevator was used to perform endarterectomy.  An eversion endarterectomy was performed in the external carotid artery.  A good distal endpoint was obtained in the internal carotid artery.  The specimen was removed and sent to pathology.  Heparinized saline was used to irrigate the endarterectomized field.  All potential embolic debris was removed.  Bovine pericardial patch angioplasty was then performed using a running 6-0 Prolene. . The common internal and external carotid arteries were all appropriately flushed. The artery  was again irrigated with heparin saline.  The anastomosis was then secured. The clamp was first released on the external carotid artery followed by the common carotid artery approximately 30 seconds later, bloodflow was reestablish through the internal carotid artery.  Next,  a hand-held  Doppler was used to evaluate the signals in the common, external, and internal  carotid arteries, all of which had appropriate signals. I then administered  50 mg protamine. The wound was then irrigated.  After hemostasis was achieved, the carotid sheath was reapproximated with 3-0 Vicryl. The  platysma muscle was reapproximated with running 3-0 Vicryl. The skin  was closed with 4-0 Vicryl. Dermabond was placed on the skin. The  patient was then successfully extubated. His neurologic exam was  similar to his preprocedural exam. The patient was then taken to recovery room  in stable condition. There were no complications.     Disposition:  To PACU in stable condition.  Relevant Operative Details:  The hypoglossal nerve was low and had to be mobilized.  I had to ligate the occipital artery and order to protect the hypoglossal nerve.  There was significant inflammatory changes on both the inside and outside of the artery.  All surfaces were very friable.  I did not place a shunt because there was excellent backbleeding and the location of the lesion made visualization difficult with a shunt.  The artery was redundant.  I resected 2 cm of the common carotid artery and performed a primary anastomosis.  Bovine pericardial patch angioplasty was performed.  Juleen China, M.D. [facility] [place] Office: [phone] Pager:  [phone]

## 2014-12-03 NOTE — Anesthesia Postprocedure Evaluation (Signed)
  Anesthesia Post-op Note  Patient: Ronnie Harvey  Procedure(s) Performed: Procedure(s): ENDARTERECTOMY CAROTID WITH PATCH ANGIOPLASTY (Left)  Patient Location: PACU  Anesthesia Type:General  Level of Consciousness: awake, alert , oriented and patient cooperative  Airway and Oxygen Therapy: Patient Spontanous Breathing  Post-op Pain: mild  Post-op Assessment: Post-op Vital signs reviewed, Patient's Cardiovascular Status Stable, Respiratory Function Stable, Patent Airway, No signs of Nausea or vomiting and Pain level controlled  Post-op Vital Signs: stable  Last Vitals:  Filed Vitals:   12/03/14 1400  BP:   Pulse: 65  Temp:   Resp: 17    Complications: No apparent anesthesia complications

## 2014-12-03 NOTE — Anesthesia Procedure Notes (Signed)
Procedure Name: Intubation Date/Time: 12/03/2014 10:48 AM Performed by: De Nurse Pre-anesthesia Checklist: Patient identified, Emergency Drugs available, Suction available, Patient being monitored and Timeout performed Patient Re-evaluated:Patient Re-evaluated prior to inductionOxygen Delivery Method: Circle system utilized Preoxygenation: Pre-oxygenation with 100% oxygen Intubation Type: IV induction Ventilation: Mask ventilation without difficulty Laryngoscope Size: Mac and 3 Grade View: Grade I Tube type: Oral Tube size: 7.5 mm Number of attempts: 1 Airway Equipment and Method: Stylet Placement Confirmation: ETT inserted through vocal cords under direct vision,  positive ETCO2 and breath sounds checked- equal and bilateral Secured at: 21 cm Tube secured with: Tape Dental Injury: Teeth and Oropharynx as per pre-operative assessment

## 2014-12-04 ENCOUNTER — Telehealth: Payer: Self-pay | Admitting: Surgery

## 2014-12-04 ENCOUNTER — Inpatient Hospital Stay (HOSPITAL_COMMUNITY): Payer: Medicare Other

## 2014-12-04 ENCOUNTER — Encounter (HOSPITAL_COMMUNITY): Payer: Self-pay | Admitting: Surgery

## 2014-12-04 LAB — CBC
HEMATOCRIT: 35.7 % — AB (ref 39.0–52.0)
Hemoglobin: 12.1 g/dL — ABNORMAL LOW (ref 13.0–17.0)
MCH: 31.9 pg (ref 26.0–34.0)
MCHC: 33.9 g/dL (ref 30.0–36.0)
MCV: 94.2 fL (ref 78.0–100.0)
Platelets: 194 10*3/uL (ref 150–400)
RBC: 3.79 MIL/uL — ABNORMAL LOW (ref 4.22–5.81)
RDW: 12 % (ref 11.5–15.5)
WBC: 11.3 10*3/uL — ABNORMAL HIGH (ref 4.0–10.5)

## 2014-12-04 LAB — BASIC METABOLIC PANEL
Anion gap: 7 (ref 5–15)
BUN: 18 mg/dL (ref 6–23)
CALCIUM: 8.5 mg/dL (ref 8.4–10.5)
CO2: 27 mmol/L (ref 19–32)
Chloride: 104 mmol/L (ref 96–112)
Creatinine, Ser: 1.21 mg/dL (ref 0.50–1.35)
GFR calc Af Amer: 69 mL/min — ABNORMAL LOW (ref >90)
GFR, EST NON AFRICAN AMERICAN: 59 mL/min — AB (ref >90)
GLUCOSE: 103 mg/dL — AB (ref 70–99)
POTASSIUM: 4 mmol/L (ref 3.5–5.1)
SODIUM: 138 mmol/L (ref 135–145)

## 2014-12-04 MED ORDER — OXYCODONE-ACETAMINOPHEN 5-325 MG PO TABS: 1.0000 | ORAL_TABLET | Freq: Four times a day (QID) | ORAL | Status: DC | PRN

## 2014-12-04 MED ORDER — CETYLPYRIDINIUM CHLORIDE 0.05 % MT LIQD
7.0000 mL | Freq: Two times a day (BID) | OROMUCOSAL | Status: DC
  Administered 2014-12-04: 7 mL via OROMUCOSAL

## 2014-12-04 NOTE — Telephone Encounter (Addendum)
-----   Message from Sharee Pimple, RN sent at 12/04/2014  9:29 AM EDT ----- Regarding: Schedule   ----- Message -----    From: Lars Mage, PA-C    Sent: 12/04/2014   7:48 AM      To: Vvs Charge Pool  F/U in 2 weeks with Dr. Myra Gianotti s/P left CEA  notifed patient of post op visit with dr. Myra Gianotti on 12-28-14 at 1:00pm

## 2014-12-04 NOTE — Progress Notes (Signed)
Patient has saturation levels of 88-82% on RA. When we ambulating him, he did not needs any assistance but his sats dropped to as low as 82% but hovered around 85% during the ambulation. When he sat back down in his bed he was 85% and it took him almost 4 minutes for his oxygen saturation to come above 90%. Yet, even with rest his O2 would fall into the 80s. Marisue Humble, [place] with Vascular notified and is aware. New orders for oxygen for home placed and will be followed up with. Will continue to monitor the patient for now.

## 2014-12-04 NOTE — Progress Notes (Signed)
Utilization review completed.  

## 2014-12-04 NOTE — Progress Notes (Signed)
      Patient is de-SAT to 33 with walking.  He needs 3L [place] t maintain O2 >/= 90 with activity.  We will order home health Oxygen at 3 L [place] and have a home health nurse re-access him in 2-3 days.    Zhion Pevehouse MAUREEN PA-C

## 2014-12-04 NOTE — Progress Notes (Signed)
[facility] [place]  Subjective  - Throat dry.  He has a little more pain than he thought he would,   Objective 148/71 73 98.1 F (36.7 C) (Oral) 15 95%  Intake/Output Summary (Last 24 hours) at 12/04/14 0723 Last data filed at 12/04/14 0500  Gross per 24 hour  Intake    840 ml  Output   1200 ml  Net   -360 ml    Incision with min-mod. Edema no frank hematoma Grip 5/5 grossly equal bil. Palpable radial pulses  Left facial droop, no tongue deviation  Assessment/Planning: POD #1 Left CEA  Pending tolerating solids at breakfast he will be D/C'd home SAT drop to 88-89 on RA will order IS  Ronnie Harvey [facility] 12/04/2014 7:23 AM --  Laboratory Lab Results:  Recent Labs  12/03/14 2015 12/04/14 0341  WBC 14.0* 11.3*  HGB 12.7* 12.1*  HCT 37.5* 35.7*  PLT 198 194   BMET  Recent Labs  12/03/14 2015 12/04/14 0341  NA  --  138  K  --  4.0  CL  --  104  CO2  --  27  GLUCOSE  --  103*  BUN  --  18  CREATININE 1.27 1.21  CALCIUM  --  8.5    COAG Lab Results  Component Value Date   INR 1.06 11/25/2014   INR 1.05 05/28/2013   INR 1.1* 05/20/2013   No results found for: PTT

## 2014-12-04 NOTE — Care Management Note (Addendum)
    Page 1 of 2   12/04/2014     2:31:00 PM CARE MANAGEMENT NOTE 12/04/2014  Patient:  Ronnie Harvey, Ronnie Harvey   Account Number:  [id number]  Date Initiated:  12/04/2014  Documentation initiated by:  Donn Pierini  Subjective/Objective Assessment:   Pt admitted s/p Carotid     Action/Plan:   PTA pt lived at home with wife   Anticipated DC Date:  12/04/2014   Anticipated DC Plan:  HOME W HOME HEALTH SERVICES      DC Planning Services  CM consult      [facility] Choice  HOME HEALTH  DURABLE MEDICAL EQUIPMENT   Choice offered to / List presented to:  C-3 Spouse   DME arranged  OXYGEN      DME agency  [facility]     [facility] arranged  HH-1 RN  HH-7 RESPIRATORY THERAPY      HH agency  [facility]   Status of service:  Completed, signed off Medicare Important Message given?  NA - LOS <3 / Initial given by admissions (If response is "NO", the following Medicare IM given date fields will be blank) Date Medicare IM given:   Medicare IM given by:   Date Additional Medicare IM given:   Additional Medicare IM given by:    Discharge Disposition:  HOME W HOME HEALTH SERVICES  Per UR Regulation:  Reviewed for med. necessity/level of care/duration of stay  If discussed at Long Length of Stay Meetings, dates discussed:    Comments:  12/04/14- 1230- Donn Pierini RN,, BSN [phone] Received call from bedside RN that pt would need home 02- orders placed- spoke with Jermaine with [facility] regarding home 02 needs- portable tank to be delivered to room prior to discharge- HH-order was placed for resp. care- this will be followed by [facility] with home 02 arrangements for post op management. Spoke with Hilda Lias with [facility] regarding HH needs for post op management. Spoke with pt's wife regarding HH arrangements-

## 2014-12-04 NOTE — Progress Notes (Signed)
SATURATION QUALIFICATIONS: (This note is used to comply with regulatory documentation for home oxygen)  Patient Saturations on Room Air at Rest = 89-90%  Patient Saturations on Room Air while Ambulating = 84%  Patient Saturations on 3 Liters of oxygen while Ambulating = 95%  Please briefly explain why patient needs home oxygen: Patient sats low 90s to upper 80s. On exertion, such as with walking, the patient desats into the low to mid 80s (83-85%).

## 2014-12-14 ENCOUNTER — Other Ambulatory Visit: Payer: Self-pay | Admitting: Cardiology

## 2014-12-17 ENCOUNTER — Telehealth: Payer: Self-pay | Admitting: *Deleted

## 2014-12-17 NOTE — Telephone Encounter (Signed)
Not comfortable d/c something order by someone else and we have not seen patient since 2014

## 2014-12-17 NOTE — Telephone Encounter (Signed)
Patient called stating that he was in hospital and was send home with O2. He states he no longer needs it and hasn't used it in a week. Wants [facility] to come and pick it up. I spoke with [facility] and told them that we haven't seen the patient since 02-21-13. She stated that we only need to fax over an order to D/C the O2 if we feel comfortable. Please advise if this is ok or if patient NTBS

## 2014-12-18 ENCOUNTER — Ambulatory Visit (INDEPENDENT_AMBULATORY_CARE_PROVIDER_SITE_OTHER): Payer: Medicare Other | Admitting: Nurse Practitioner

## 2014-12-18 ENCOUNTER — Encounter: Payer: Self-pay | Admitting: Nurse Practitioner

## 2014-12-18 VITALS — BP 138/84 | HR 71 | Temp 97.3°F | Ht 64.0 in | Wt 158.0 lb

## 2014-12-18 DIAGNOSIS — Z9889 Other specified postprocedural states: Secondary | ICD-10-CM

## 2014-12-18 NOTE — Telephone Encounter (Signed)
Made patient an appt for today.

## 2014-12-18 NOTE — Patient Instructions (Signed)
Fat and Cholesterol Control Diet Fat and cholesterol levels in your blood and organs are influenced by your diet. High levels of fat and cholesterol may lead to diseases of the heart, small and large blood vessels, gallbladder, liver, and pancreas. CONTROLLING FAT AND CHOLESTEROL WITH DIET Although exercise and lifestyle factors are important, your diet is key. That is because certain foods are known to raise cholesterol and others to lower it. The goal is to balance foods for their effect on cholesterol and more importantly, to replace saturated and trans fat with other types of fat, such as monounsaturated fat, polyunsaturated fat, and omega-3 fatty acids. On average, a person should consume no more than 15 to 17 g of saturated fat daily. Saturated and trans fats are considered "bad" fats, and they will raise LDL cholesterol. Saturated fats are primarily found in animal products such as meats, butter, and cream. However, that does not mean you need to give up all your favorite foods. Today, there are good tasting, low-fat, low-cholesterol substitutes for most of the things you like to eat. Choose low-fat or nonfat alternatives. Choose round or loin cuts of red meat. These types of cuts are lowest in fat and cholesterol. Chicken (without the skin), fish, veal, and ground turkey breast are great choices. Eliminate fatty meats, such as hot dogs and salami. Even shellfish have little or no saturated fat. Have a 3 oz (85 g) portion when you eat lean meat, poultry, or fish. Trans fats are also called "partially hydrogenated oils." They are oils that have been scientifically manipulated so that they are solid at room temperature resulting in a longer shelf life and improved taste and texture of foods in which they are added. Trans fats are found in stick margarine, some tub margarines, cookies, crackers, and baked goods.  When baking and cooking, oils are a great substitute for butter. The monounsaturated oils are  especially beneficial since it is believed they lower LDL and raise HDL. The oils you should avoid entirely are saturated tropical oils, such as coconut and palm.  Remember to eat a lot from food groups that are naturally free of saturated and trans fat, including fish, fruit, vegetables, beans, grains (barley, rice, couscous, bulgur wheat), and pasta (without cream sauces).  IDENTIFYING FOODS THAT LOWER FAT AND CHOLESTEROL  Soluble fiber may lower your cholesterol. This type of fiber is found in fruits such as apples, vegetables such as broccoli, potatoes, and carrots, legumes such as beans, peas, and lentils, and grains such as barley. Foods fortified with plant sterols (phytosterol) may also lower cholesterol. You should eat at least 2 g per day of these foods for a cholesterol lowering effect.  Read package labels to identify low-saturated fats, trans fat free, and low-fat foods at the supermarket. Select cheeses that have only 2 to 3 g saturated fat per ounce. Use a heart-healthy tub margarine that is free of trans fats or partially hydrogenated oil. When buying baked goods (cookies, crackers), avoid partially hydrogenated oils. Breads and muffins should be made from whole grains (whole-wheat or whole oat flour, instead of "flour" or "enriched flour"). Buy non-creamy canned soups with reduced salt and no added fats.  FOOD PREPARATION TECHNIQUES  Never deep-fry. If you must fry, either stir-fry, which uses very little fat, or use non-stick cooking sprays. When possible, broil, bake, or roast meats, and steam vegetables. Instead of putting butter or margarine on vegetables, use lemon and herbs, applesauce, and cinnamon (for squash and sweet potatoes). Use nonfat   yogurt, salsa, and low-fat dressings for salads.  LOW-SATURATED FAT / LOW-FAT FOOD SUBSTITUTES Meats / Saturated Fat (g)  Avoid: Steak, marbled (3 oz/85 g) / 11 g  Choose: Steak, lean (3 oz/85 g) / 4 g  Avoid: Hamburger (3 oz/85 g) / 7  g  Choose: Hamburger, lean (3 oz/85 g) / 5 g  Avoid: Ham (3 oz/85 g) / 6 g  Choose: Ham, lean cut (3 oz/85 g) / 2.4 g  Avoid: Chicken, with skin, dark meat (3 oz/85 g) / 4 g  Choose: Chicken, skin removed, dark meat (3 oz/85 g) / 2 g  Avoid: Chicken, with skin, light meat (3 oz/85 g) / 2.5 g  Choose: Chicken, skin removed, light meat (3 oz/85 g) / 1 g Dairy / Saturated Fat (g)  Avoid: Whole milk (1 cup) / 5 g  Choose: Low-fat milk, 2% (1 cup) / 3 g  Choose: Low-fat milk, 1% (1 cup) / 1.5 g  Choose: Skim milk (1 cup) / 0.3 g  Avoid: Hard cheese (1 oz/28 g) / 6 g  Choose: Skim milk cheese (1 oz/28 g) / 2 to 3 g  Avoid: Cottage cheese, 4% fat (1 cup) / 6.5 g  Choose: Low-fat cottage cheese, 1% fat (1 cup) / 1.5 g  Avoid: Ice cream (1 cup) / 9 g  Choose: Sherbet (1 cup) / 2.5 g  Choose: Nonfat frozen yogurt (1 cup) / 0.3 g  Choose: Frozen fruit bar / trace  Avoid: Whipped cream (1 tbs) / 3.5 g  Choose: Nondairy whipped topping (1 tbs) / 1 g Condiments / Saturated Fat (g)  Avoid: Mayonnaise (1 tbs) / 2 g  Choose: Low-fat mayonnaise (1 tbs) / 1 g  Avoid: Butter (1 tbs) / 7 g  Choose: Extra light margarine (1 tbs) / 1 g  Avoid: Coconut oil (1 tbs) / 11.8 g  Choose: Olive oil (1 tbs) / 1.8 g  Choose: Corn oil (1 tbs) / 1.7 g  Choose: Safflower oil (1 tbs) / 1.2 g  Choose: Sunflower oil (1 tbs) / 1.4 g  Choose: Soybean oil (1 tbs) / 2.4 g  Choose: Canola oil (1 tbs) / 1 g Document Released: 09/04/2005 Document Revised: 12/30/2012 Document Reviewed: 12/03/2013 ExitCare Patient Information 2015 [employer]. This information is not intended to replace advice given to you by your health care provider. Make sure you discuss any questions you have with your health care provider.  

## 2014-12-18 NOTE — Progress Notes (Signed)
   Subjective:    Patient ID: Ronnie Harvey, male    DOB: 02-Sep-1946, 69 y.o.   MRN: [id number]  HPI Patient was in hospital for carotid endarectomy and was sent home on O2. He has since stopped using and they wants it to be d/c. Home health will not come pick it up without and order from [place] to d/c'd.    Review of Systems  Constitutional: Negative.   HENT: Negative.   Respiratory: Negative.   Cardiovascular: Negative.   Genitourinary: Negative.   Neurological: Negative.   Psychiatric/Behavioral: Negative.   All other systems reviewed and are negative.      Objective:   Physical Exam  Constitutional: He is oriented to person, place, and time. He appears well-developed and well-nourished.  Cardiovascular: Normal rate, regular rhythm and normal heart sounds.   Pulmonary/Chest: Effort normal and breath sounds normal.  Neurological: He is alert and oriented to person, place, and time.  Skin: Skin is warm and dry.  Psychiatric: He has a normal mood and affect. His behavior is normal. Judgment and thought content normal.    BP 138/84 mmHg  Pulse 71  Temp(Src) 97.3 F (36.3 C) (Oral)  Ht 5\' 4"  (1.626 m)  Wt 158 lb (71.668 kg)  BMI 27.11 kg/m2  SpO2 96%        Assessment & Plan:   1. S/P carotid endarterectomy    Ok to d/c home o2 RTO in 3 months follow up  Mary-Margaret Daphine Deutscher, FNP

## 2014-12-21 NOTE — Discharge Summary (Signed)
[facility] Discharge Summary   Patient ID:  Ronnie Harvey MRN: [id number] DOB/AGE: Mar 20, 1946 69 y.o.  Admit date: 12/03/2014 Discharge date: 12/21/2014 Date of Surgery: 12/03/2014 Surgeon: Surgeon(s): Nada Libman, MD  Admission Diagnosis: Left internal carotid artery stenosis I65.22  Discharge Diagnoses:  Left internal carotid artery stenosis I65.22  Secondary Diagnoses: Past Medical History  Diagnosis Date  . ETOH abuse     Quit 1998  . Tobacco abuse     Quit Dec 2012  . Cardiomyopathy, dilated     a. 09/2012 Echo: EF 15-20%, diff HK, gr2 DD, Triv AI, Mod MR, mild TR/PR, mod-sev dil LA.  [name] PAF (paroxysmal atrial fibrillation)   . PVD (peripheral vascular disease)     a. 05/2013 s/p PTA/self-expanding stenting of the REIA and LCIA.  [name] CAD (coronary artery disease) nonobstructive    a. 10/2012 Cath: LM 50, LAD 25p, 35m, Diag 59m, LCX 30p, 40d, OM1 60-70, OM3 30, RCA 40-45m, AM 70p, PDA nl, EF 20%.  . CHF (congestive heart failure)   . Myocardial infarction   . Shortness of breath dyspnea     with exertion  . GERD (gastroesophageal reflux disease)     rarely (worse when he was drinking)  . Arthritis   . Diverticulitis   . Retinal hole of right eye   . Itchy skin     Procedure(s): ENDARTERECTOMY CAROTID WITH PATCH ANGIOPLASTY  Discharged Condition: good  HPI: The patient is back today for follow-up of his carotid occlusive disease. He remained asymptomatic. He denies numbness or weakness in either extremity. He denies slurred speech. He denies amaurosis fugax. He recently underwent angiography to further define the stenosis in his left carotid artery which was felt to be very high and associated with a kink. Angiography revealed a 90% stenosis.    The patient suffers from peripheral vascular disease and has undergone iliac stenting bilaterally. The patient was hospitalized in December 2013 for diverticulitis with microperforation. He ended  up with respiratory failure and intubation. He has undergone cardiac catheterization which shows a 40% LAD stenosis 30% circumflexstenosis 40-50 percent right coronary stenosis with an ejection fraction of 20%. On subsequent imaging his ejection fraction has increased slightly to about 30%.  The patient suffers from hypercholesterolemia which is managed with a statin. His hypertension is well controlled with an angiotensin receptorl blocker. He is on aspirin for antiplatelet measures.he quit smoking in 2012.   Hospital Course:  Ronnie Harvey is a 69 y.o. male is S/P Left Procedure(s): ENDARTERECTOMY CAROTID WITH PATCH ANGIOPLASTY He was tolerating PO's well, voided independently, and ambulating with decreased saturation of O2 down to 82.  He was discharged on oxygen via [place] at 3L with home health for re-acces ment in 2-3 days.   Disposition stable.  Consults:     Significant Diagnostic Studies: CBC Lab Results  Component Value Date   WBC 11.3* 12/04/2014   HGB 12.1* 12/04/2014   HCT 35.7* 12/04/2014   MCV 94.2 12/04/2014   PLT 194 12/04/2014    BMET    Component Value Date/Time   NA 138 12/04/2014 0341   NA 139 06/08/2014 0940   K 4.0 12/04/2014 0341   CL 104 12/04/2014 0341   CO2 27 12/04/2014 0341   GLUCOSE 103* 12/04/2014 0341   GLUCOSE 111* 06/08/2014 0940   BUN 18 12/04/2014 0341   BUN 37* 06/08/2014 0940   CREATININE 1.21 12/04/2014 0341   CREATININE 1.26 03/13/2013 1625   CALCIUM 8.5  12/04/2014 0341   GFRNONAA 59* 12/04/2014 0341   GFRNONAA 58* 03/13/2013 1625   GFRAA 69* 12/04/2014 0341   GFRAA 67 03/13/2013 1625   COAG Lab Results  Component Value Date   INR 1.06 11/25/2014   INR 1.05 05/28/2013   INR 1.1* 05/20/2013     Disposition:  Discharge to :Home Discharge Instructions    Call MD for:  redness, tenderness, or signs of infection (pain, swelling, bleeding, redness, odor or green/yellow discharge around incision site)    Complete by:  As  directed      Call MD for:  severe or increased pain, loss or decreased feeling  in affected limb(s)    Complete by:  As directed      Call MD for:  temperature >100.5    Complete by:  As directed      Discharge instructions    Complete by:  As directed   You may shower, shave around the incision until you follow up in the office in 2 weeks     Discharge patient    Complete by:  As directed   Discharge pt to home     Driving Restrictions    Complete by:  As directed   No driving for 2 weeks     Lifting restrictions    Complete by:  As directed   No lifting for 6 weeks     Resume previous diet    Complete by:  As directed             Medication List    STOP taking these medications        losartan 50 MG tablet  Commonly known as:  COZAAR      TAKE these medications        aspirin EC 81 MG tablet  Take 1 tablet (81 mg total) by mouth daily.     atorvastatin 40 MG tablet  Commonly known as:  LIPITOR  Take  by mouth daily     carvedilol 25 MG tablet  Commonly known as:  COREG  TAKE 1 TABLET TWICE A DAY WITH MEALS     Difluprednate 0.05 % Emul  Apply 1 drop to eye 2 (two) times daily. Right Eye     furosemide 20 MG tablet  Commonly known as:  LASIX  Take 1 tablet (20 mg total) by mouth daily.     oxyCODONE-acetaminophen 5-325 MG per tablet  Commonly known as:  PERCOCET/ROXICET  Take 1 tablet by mouth every 6 (six) hours as needed for moderate pain.     spironolactone 50 MG tablet  Commonly known as:  ALDACTONE  Take 1 tablet (50 mg total) by mouth daily.       Verbal and written Discharge instructions given to the patient. Wound care per Discharge AVS     Follow-up Information    Follow up with Myra Gianotti IV, Lala Lund, MD In 2 weeks.   Specialty:  Vascular Surgery   Contact information:   [address] [place] [address] [phone]       Follow up with Rudi Heap, MD.   Specialty:  [facility] Medicine   Contact information:   [address]  [place] [address] [phone]       Follow up with [facility].   Why:  HH-RN for post op management   Contact information:   [address] [place] [address] [phone]       Follow up with [facility].   Why:  home 02 arranged   Contact information:   [address] [place] [address] [phone]       Signed: Clinton Gallant [facility] 12/21/2014, 12:16 PM  --- For VQI Registry use --- Instructions: Press F2 to tab through selections.  Delete question if not applicable.   Modified Rankin score at D/C (0-6): Rankin Score=0  IV medication needed for:  1. Hypertension: No 2. Hypotension: No  Post-op Complications: No  1. Post-op CVA or TIA: No  If yes: Event classification (right eye, left eye, right cortical, left cortical, verterobasilar, other):   If yes: Timing of event (intra-op, <6 hrs post-op, >=6 hrs post-op, unknown):   2. CN injury: No  If yes: CN  injuried   3. Myocardial infarction: No  If yes: Dx by (EKG or clinical, Troponin):   4.  CHF: No  5.  Dysrhythmia (new): No  6. Wound infection: No  7. Reperfusion symptoms: No  8. Return to OR: No  If yes: return to OR for (bleeding, neurologic, other CEA incision, other):   Discharge medications: Statin use:  Yes ASA use:  Yes Beta blocker use:  Yes ACE-Inhibitor use:  No  for medical reason   P2Y12 Antagonist use: [x ] None,  Plavix,  Plasugrel,  Ticlopinine,  Ticagrelor,  Other,  No for medical reason,  Non-compliant,  Not-indicated Anti-coagulant use:  [x ] None,  Warfarin,  Rivaroxaban,  Dabigatran,  Other,  No for medical reason,  Non-compliant,  Not-indicated

## 2014-12-25 ENCOUNTER — Encounter: Payer: Self-pay | Admitting: Surgery

## 2014-12-28 ENCOUNTER — Ambulatory Visit (INDEPENDENT_AMBULATORY_CARE_PROVIDER_SITE_OTHER): Payer: Self-pay | Admitting: Surgery

## 2014-12-28 ENCOUNTER — Encounter: Payer: Self-pay | Admitting: Surgery

## 2014-12-28 ENCOUNTER — Other Ambulatory Visit: Payer: Self-pay | Admitting: *Deleted

## 2014-12-28 VITALS — BP 162/66 | HR 69 | Temp 97.9°F | Resp 20 | Ht 64.0 in | Wt 158.0 lb

## 2014-12-28 DIAGNOSIS — I6523 Occlusion and stenosis of bilateral carotid arteries: Secondary | ICD-10-CM

## 2014-12-28 DIAGNOSIS — Z48812 Encounter for surgical aftercare following surgery on the circulatory system: Secondary | ICD-10-CM

## 2014-12-28 NOTE — Progress Notes (Signed)
Patient name: Ronnie Harvey MRN: [id number] DOB: 1946-08-25 Sex: male     Chief Complaint  Patient presents with  . Routine Post Op    s/p Left CEA 12/03/14    HISTORY OF PRESENT ILLNESS: The patient is back today for follow-up.  He is status post left carotid endarterectomy with bovine pericardial patch angioplasty as well as resection with primary anastomosis of his left common carotid artery.  This was done on 12/03/2014 for asymptomatic left carotid stenosis.  The patient's hypoglossal nerve was very low and had to be mobilized.  The occipital artery had to be ligated as well.  The patient's postoperative course was uncomplicated.  He was discharged home following day.  He is back today without complaints.  Past Medical History  Diagnosis Date  . ETOH abuse     Quit 1998  . Tobacco abuse     Quit Dec 2012  . Cardiomyopathy, dilated     a. 09/2012 Echo: EF 15-20%, diff HK, gr2 DD, Triv AI, Mod MR, mild TR/PR, mod-sev dil LA.  [name] PAF (paroxysmal atrial fibrillation)   . PVD (peripheral vascular disease)     a. 05/2013 s/p PTA/self-expanding stenting of the REIA and LCIA.  [name] CAD (coronary artery disease) nonobstructive    a. 10/2012 Cath: LM 50, LAD 25p, 67m, Diag 19m, LCX 30p, 40d, OM1 60-70, OM3 30, RCA 40-18m, AM 70p, PDA nl, EF 20%.  . CHF (congestive heart failure)   . Myocardial infarction   . Shortness of breath dyspnea     with exertion  . GERD (gastroesophageal reflux disease)     rarely (worse when he was drinking)  . Arthritis   . Diverticulitis   . Retinal hole of right eye   . Itchy skin     Past Surgical History  Procedure Laterality Date  . Neck cyst resection    . Vasectomy    . Abdominal aortagram N/A 05/28/2013    Procedure: ABDOMINAL Ronny Flurry;  Surgeon: Iran Ouch, MD;  [facility] LAB;  Service: Cardiovascular;  Laterality: N/A;  . Percutaneous stent intervention Right 05/28/2013    Procedure: PERCUTANEOUS STENT INTERVENTION;  Surgeon:  Iran Ouch, MD;  Location: [facility] CATH LAB;  Service: Cardiovascular;  Laterality: Right;  . Carotid angiogram Bilateral 10/27/2014    Procedure: CAROTID ANGIOGRAM;  Surgeon: Nada Libman, MD;  Location: [facility] CATH LAB;  Service: Cardiovascular;  Laterality: Bilateral;  . Eye surgery Right     cataract surgery with lens implant  . Endarterectomy Left 12/03/2014    Procedure: ENDARTERECTOMY CAROTID WITH PATCH ANGIOPLASTY;  Surgeon: Nada Libman, MD;  Location: [facility] OR;  Service: Vascular;  Laterality: Left;    History   Social History  . Marital Status: Married    Spouse Name: N/A  . Number of Children: N/A  . Years of Education: N/A   Occupational History  . Not on file.   Social History Main Topics  . Smoking status: Former Smoker -- 1.00 packs/day for 49 years    Types: Cigarettes    Start date: 08/30/2012    Quit date: 11/24/2012  . Smokeless tobacco: Former Neurosurgeon    Types: Snuff  . Alcohol Use: No     Comment: Former ETOH.  None since 1998 Prior to 1998 was a heavy drinker  . Drug Use: No  . Sexual Activity: Not on file   Other Topics Concern  . Not on file   Social History Narrative  Family History  Problem Relation Age of Onset  . Diabetes Mother   . Diabetes Father   . Diabetes Brother     Allergies as of 12/28/2014  . (No Known Allergies)    Current Outpatient Prescriptions on File Prior to Visit  Medication Sig Dispense Refill  . aspirin EC 81 MG tablet Take 1 tablet (81 mg total) by mouth daily.    [name] atorvastatin (LIPITOR) 40 MG tablet Take  by mouth daily (Patient taking differently: daily at 6 PM. Take  by mouth daily) 90 tablet 3  . carvedilol (COREG) 25 MG tablet TAKE 1 TABLET TWICE A DAY WITH MEALS (Patient taking differently: Take  by mouth twice daily with meals) 180 tablet 3  . furosemide (LASIX) 20 MG tablet Take 1 tablet (20 mg total) by mouth daily. 90 tablet 3  . losartan (COZAAR) 50 MG tablet TAKE 1 TABLET TWICE A DAY 180  tablet 0  . oxyCODONE-acetaminophen (PERCOCET/ROXICET) 5-325 MG per tablet Take 1 tablet by mouth every 6 (six) hours as needed for moderate pain. 30 tablet 0  . spironolactone (ALDACTONE) 50 MG tablet Take 1 tablet (50 mg total) by mouth daily. 90 tablet 3   No current facility-administered medications on file prior to visit.       PHYSICAL EXAMINATION:   Vital signs are There were no vitals filed for this visit. There is no weight on file to calculate BMI. General: The patient appears their stated age. HEENT:  No gross abnormalities Pulmonary:  Non labored breathing Musculoskeletal: There are no major deformities. Neurologic: Left marginal mandibular neurapraxia Skin: There are no ulcer or rashes noted. Psychiatric: The patient has normal affect. Cardiovascular: There is a regular rate and rhythm without significant murmur appreciated.   Diagnostic Studies None  Assessment: Status post left carotid endarterectomy Plan: Patient will follow-up in 6 months with a repeat carotid ultrasound.  He does have a slight marginal mandibular neurapraxia which I told him should resolve within the next several months.  Otherwise he is doing very well.  Jorge Ny, M.D. [facility] [place] Office: [phone] Pager:  [phone]

## 2014-12-28 NOTE — Progress Notes (Signed)
Filed Vitals:   12/28/14 1333 12/28/14 1334 12/28/14 1340  BP: 129/69 151/71 162/66  Pulse: 69    Temp: 97.9 F (36.6 C)    TempSrc: Oral    Resp: 20    Height: 5\' 4"  (1.626 m)    Weight: 158 lb (71.668 kg)

## 2014-12-31 ENCOUNTER — Ambulatory Visit (INDEPENDENT_AMBULATORY_CARE_PROVIDER_SITE_OTHER): Payer: Medicare Other | Admitting: Ophthalmology

## 2014-12-31 DIAGNOSIS — H2701 Aphakia, right eye: Secondary | ICD-10-CM

## 2015-01-15 IMAGING — US US AORTA
1 series · 14 of 15 positions shown · non-contrast
Comparison: CT abdomen and pelvis 09/06/2012

CLINICAL DATA: 67-year-old male.  Irregular heartbeat.  [place].
Possible abdominal aortic aneurysm.

ULTRASOUND OF ABDOMINAL AORTA
TECHNIQUE: Ultrasound examination of the abdominal aorta was
performed to evaluate for abdominal aortic aneurysm.

[Series 1: us aorta · 0.42mm/px · 14 of 15 slices shown]
[im 1/15]
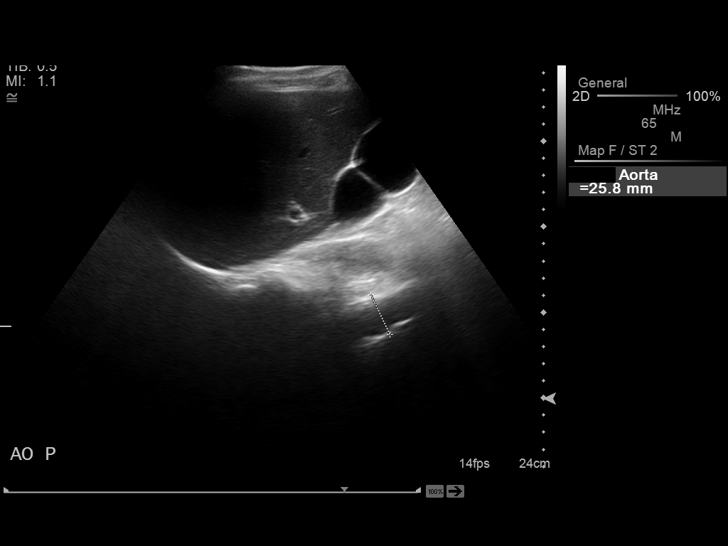
[im 2/15]
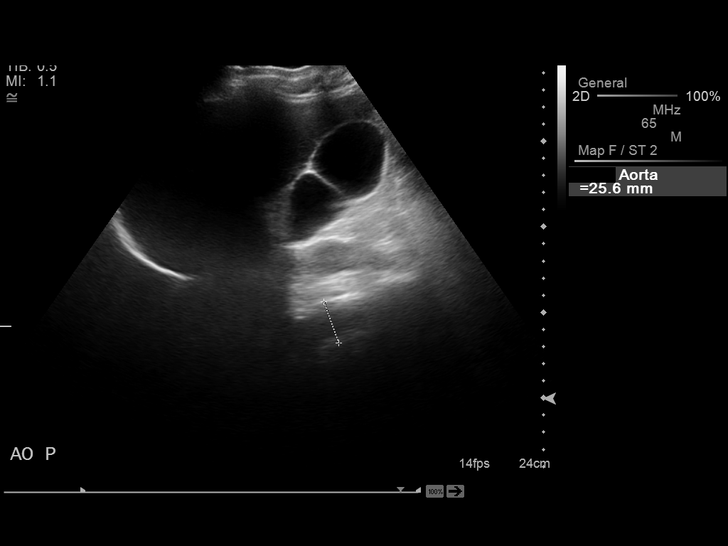
[im 3/15]
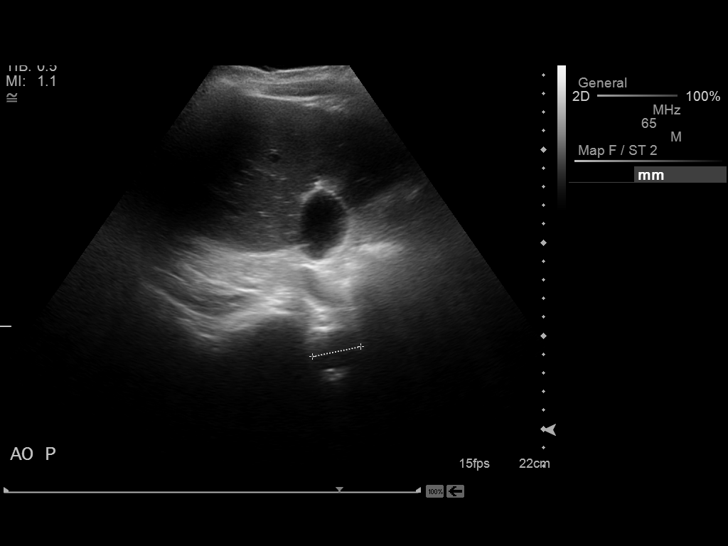
[im 4/15]
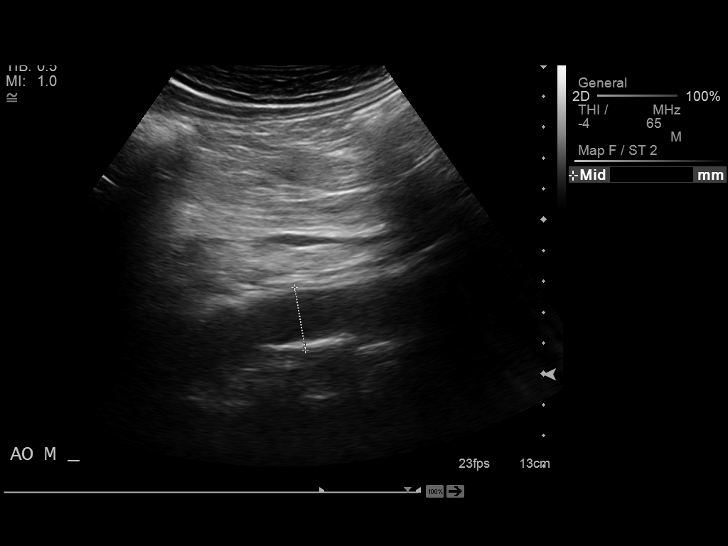
[im 5/15]
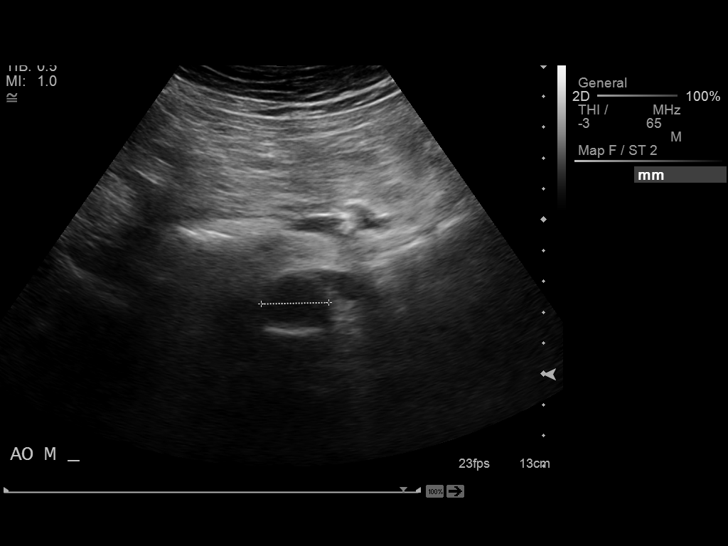
[im 6/15]
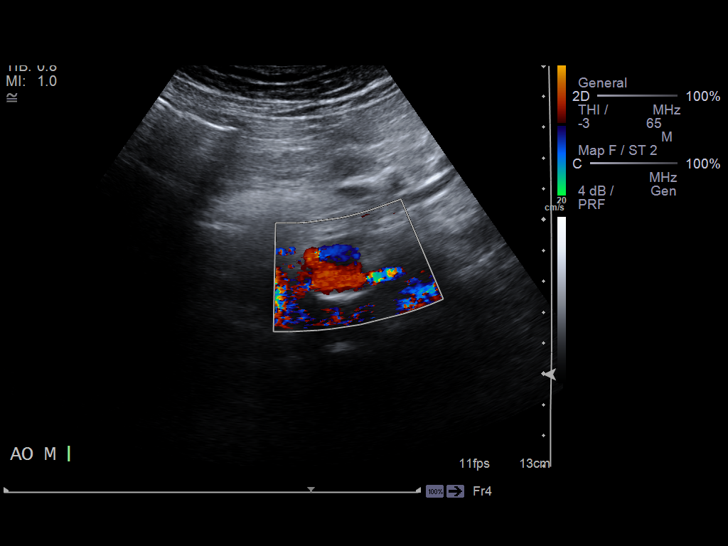
[im 7/15]
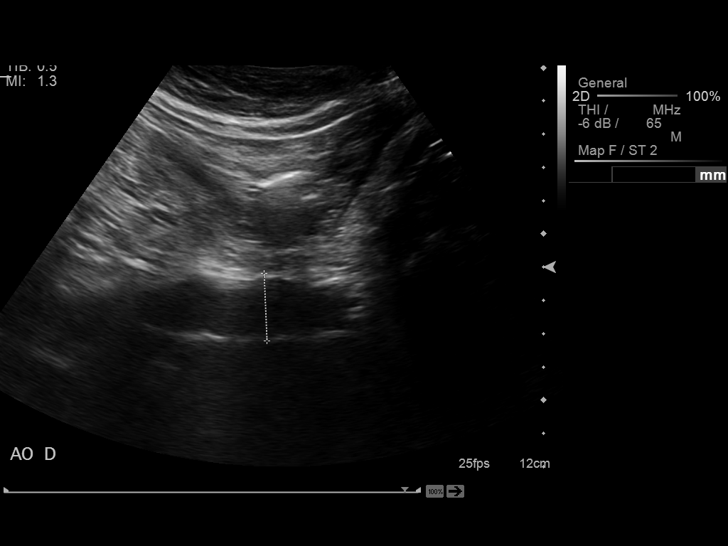
[im 9/15]
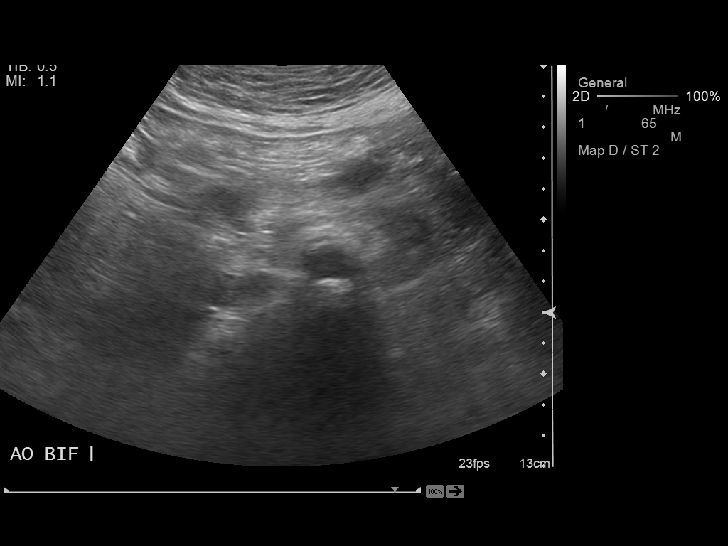
[im 10/15]
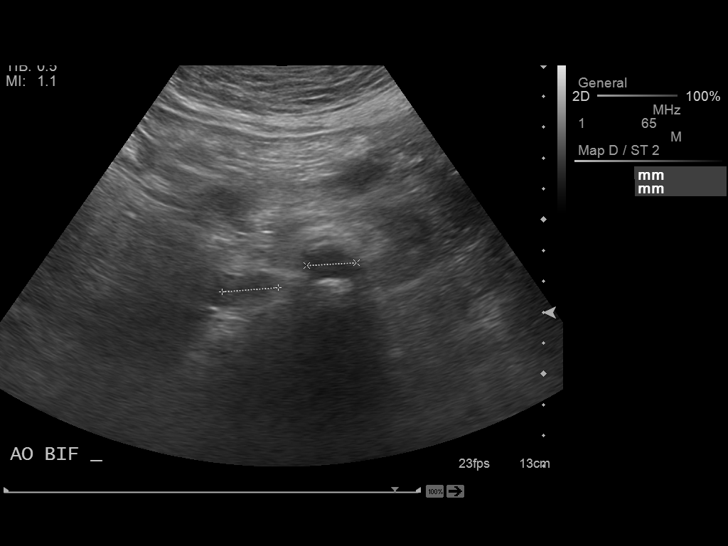
[im 11/15]
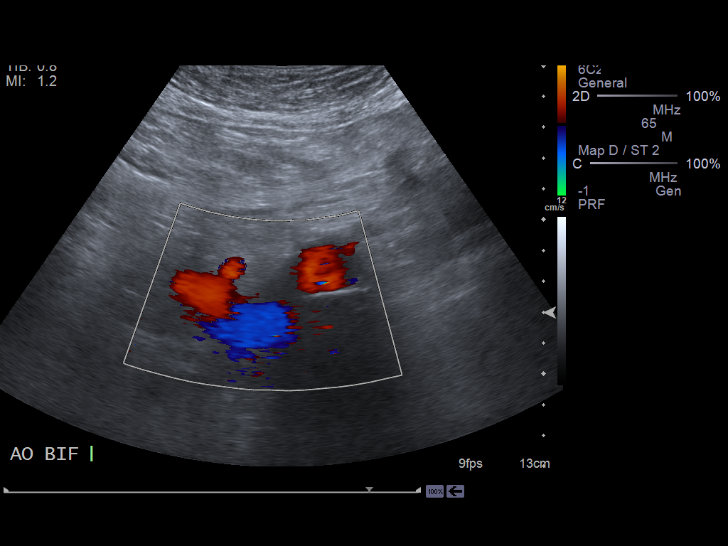
[im 12/15]
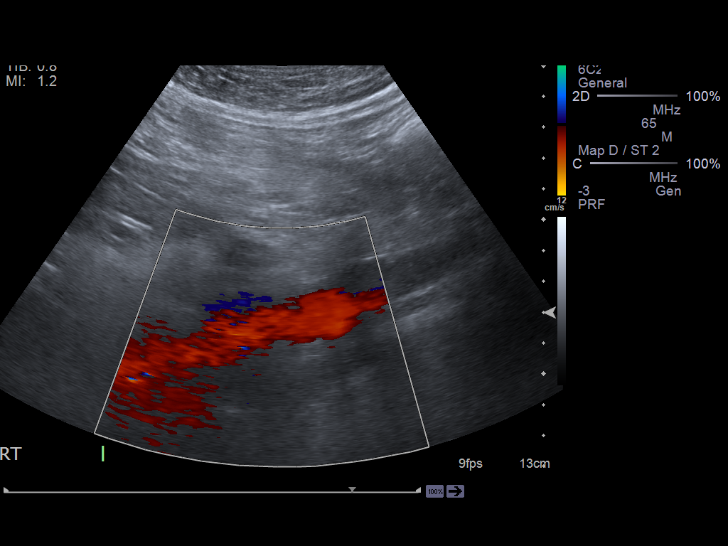
[im 13/15]
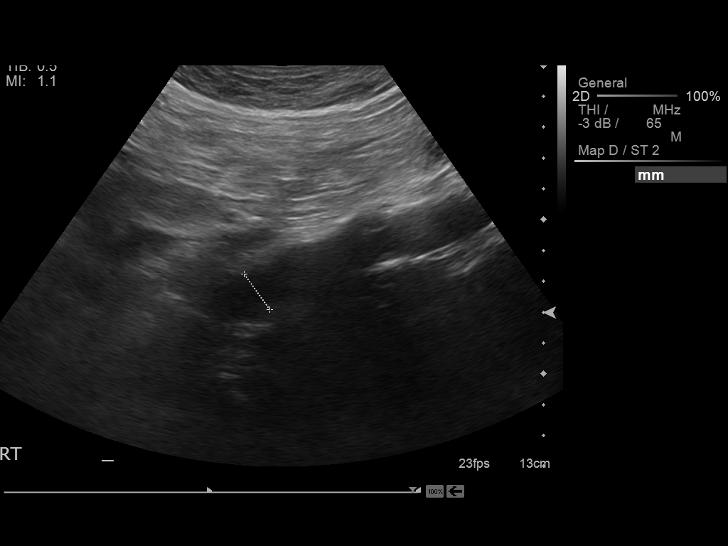
[im 14/15]
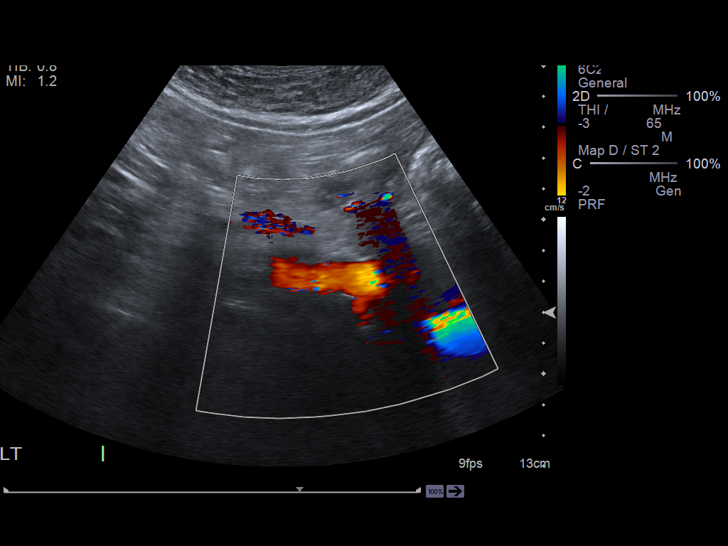
[im 15/15]
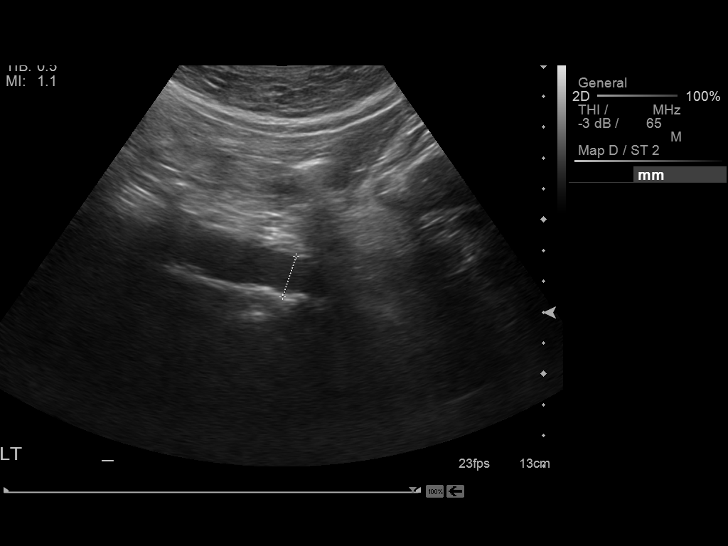

[14 of 15 positions shown; findings below may reference images not displayed]

Abdominal Aorta:  Irregularity compatible with atherosclerosis.

      Maximum AP diameter:  2.6 cm.
      Maximum TRV diameter:  2.7 cm.

Previous maximum diameter of the infrarenal abdominal aorta by CT
to 2.8 cm.

Right common iliac artery maximum diameter 1.8 cm.
Left common iliac artery maximum diameter 1.6 cm.
IMPRESSION: Ectatic abdominal aorta at risk for aneurysm development. Maximum
diameter stable since 09/06/2012 at 2.7 cm.
Recommend followup by US in 5 years.  This recommendation follows
ACR consensus guidelines: White Paper of the ACR [employer]

## 2015-03-15 ENCOUNTER — Other Ambulatory Visit: Payer: Self-pay | Admitting: Cardiology

## 2015-03-15 ENCOUNTER — Other Ambulatory Visit: Payer: Self-pay

## 2015-03-15 MED ORDER — LOSARTAN POTASSIUM 50 MG PO TABS: 50.0000 mg | ORAL_TABLET | Freq: Two times a day (BID) | ORAL | Status: DC

## 2015-04-18 ENCOUNTER — Other Ambulatory Visit: Payer: Self-pay | Admitting: Cardiology

## 2015-05-12 ENCOUNTER — Encounter: Payer: Self-pay | Admitting: Cardiology

## 2015-05-12 ENCOUNTER — Ambulatory Visit (INDEPENDENT_AMBULATORY_CARE_PROVIDER_SITE_OTHER): Payer: Medicare Other | Admitting: Cardiology

## 2015-05-12 VITALS — BP 130/82 | HR 62 | Ht 64.0 in | Wt 163.0 lb

## 2015-05-12 DIAGNOSIS — I2510 Atherosclerotic heart disease of native coronary artery without angina pectoris: Secondary | ICD-10-CM | POA: Diagnosis not present

## 2015-05-12 DIAGNOSIS — I429 Cardiomyopathy, unspecified: Secondary | ICD-10-CM | POA: Diagnosis not present

## 2015-05-12 MED ORDER — LOSARTAN POTASSIUM 50 MG PO TABS: 50.0000 mg | ORAL_TABLET | Freq: Two times a day (BID) | ORAL | Status: DC

## 2015-05-12 NOTE — Progress Notes (Signed)
HPI The patient presents for followup of cardiomyopathy. He was seen in consultation by [place] at the time of diverticulitis and bowel microperforation a few years ago. He had atrial fibrillation paroxysmally in as part of this hospitalization. He had respiratory failure and required intubation. His ejection fraction was 20%.  He was not anticoagulated at that time because of his ongoing acute issues. Cardiac catheterization which demonstrated left main 50% stenosis, LAD 40% stenosis, circumflex 30% followed by distal 40% stenosis, small obtuse marginal 60-70% stenosis and right coronary artery 40-50% stenosis. The EF was 20%.  He has also had PTA and stenting of his right iliac and left common iliac by Dr. Kirke Corin.  I did repeat an echo last year and the EF is about 30% which is slightly better than his lowest EF.  He is now status post CEA.  The patient denies any new symptoms such as chest discomfort, neck or arm discomfort. There has been no new shortness of breath, PND or orthopnea. There have been no reported palpitations, presyncope or syncope.  He says he is active around his house.     Current Outpatient Prescriptions  Medication Sig Dispense Refill  . aspirin EC 81 MG tablet Take 1 tablet (81 mg total) by mouth daily.    [name] atorvastatin (LIPITOR) 40 MG tablet Take 40 mg by mouth daily.    . carvedilol (COREG) 25 MG tablet TAKE 1 TABLET TWICE A DAY WITH MEALS (Patient taking differently: Take  by mouth twice daily with meals) 180 tablet 3  . furosemide (LASIX) 20 MG tablet Take 1 tablet (20 mg total) by mouth daily. 90 tablet 3  . losartan (COZAAR) 50 MG tablet Take 1 tablet (50 mg total) by mouth 2 (two) times daily. 180 tablet 0  . spironolactone (ALDACTONE) 50 MG tablet Take 1 tablet (50 mg total) by mouth daily. 90 tablet 3   No current facility-administered medications for this visit.    Past Medical History  Diagnosis Date  . ETOH abuse     Quit 1998  . Tobacco abuse     Quit  Dec 2012  . Cardiomyopathy, dilated     a. 09/2012 Echo: EF 15-20%, diff HK, gr2 DD, Triv AI, Mod MR, mild TR/PR, mod-sev dil LA.  [name] PAF (paroxysmal atrial fibrillation)   . PVD (peripheral vascular disease)     a. 05/2013 s/p PTA/self-expanding stenting of the REIA and LCIA.  [name] CAD (coronary artery disease) nonobstructive    a. 10/2012 Cath: LM 50, LAD 25p, 46m, Diag 78m, LCX 30p, 40d, OM1 60-70, OM3 30, RCA 40-54m, AM 70p, PDA nl, EF 20%.  . CHF (congestive heart failure)   . Myocardial infarction   . Shortness of breath dyspnea     with exertion  . GERD (gastroesophageal reflux disease)     rarely (worse when he was drinking)  . Arthritis   . Diverticulitis   . Retinal hole of right eye   . Itchy skin     Past Surgical History  Procedure Laterality Date  . Neck cyst resection    . Vasectomy    . Abdominal aortagram N/A 05/28/2013    Procedure: ABDOMINAL Ronny Flurry;  Surgeon: Iran Ouch, MD;  Location: [facility] CATH LAB;  Service: Cardiovascular;  Laterality: N/A;  . Percutaneous stent intervention Right 05/28/2013    Procedure: PERCUTANEOUS STENT INTERVENTION;  Surgeon: Iran Ouch, MD;  Location: [facility] CATH LAB;  Service: Cardiovascular;  Laterality: Right;  . Carotid angiogram Bilateral  10/27/2014    Procedure: CAROTID ANGIOGRAM;  Surgeon: Nada Libman, MD;  Location: [facility] CATH LAB;  Service: Cardiovascular;  Laterality: Bilateral;  . Eye surgery Right     cataract surgery with lens implant  . Endarterectomy Left 12/03/2014    Procedure: ENDARTERECTOMY CAROTID WITH PATCH ANGIOPLASTY;  Surgeon: Nada Libman, MD;  Location: [facility] OR;  Service: Vascular;  Laterality: Left;    ROS:  As stated in the HPI and negative for all other systems.  PHYSICAL EXAM BP 130/82 mmHg  Pulse 62  Ht 5\' 4"  (1.626 m)  Wt 163 lb (73.936 kg)  BMI 27.97 kg/m2 GENERAL:  Well appearing HEENT: PERRL NECK:  No jugular venous distention, waveform within normal limits, carotid upstroke brisk and  symmetric, subtle carotid bilateral bruits, no thyromegaly LUNGS:  Clear to auscultation bilaterally HEART:  PMI not displaced or sustained,S1 and S2 within normal limits, no S3, no S4, no clicks, no rubs, no murmurs ABD:  Flat, positive bowel sounds normal in frequency in pitch, positive midline soft bruits, no rebound, no guarding, no midline pulsatile mass, no hepatomegaly, no splenomegaly EXT:  2 plus pulses upper, diminished bilateral lower, left femoral bruit no edema, no cyanosis no clubbing NEURO:  Nonfocal  EKG:  Sinus rhythm, rate 62, axis within normal limits, minimal will criteria for left ventricular hypertrophy with nonspecific ST-T wave changes.. 05/12/2015  ASSESSMENT AND PLAN  Cardiomyopathy He has class I symptoms.  No change in therapy is indicated.    Atrial fibrillation He has had no recurrent dysrhythmias since stopping ETOH.   Atrial fib was seen only during his acute illness.  No anticoagulation is indicated.   CAD This is nonobstructive.  He will continue to be managed with risk reduction.  HTN The blood pressure is at target. No change in medications is indicated. We will continue with therapeutic lifestyle changes (TLC).  CAROTID STENOSIS He is status post CEA.  Follow up per [facility]  PVD He has had symptom relief.   We will continue with risk reduction.

## 2015-05-12 NOTE — Patient Instructions (Signed)
Medication Instructions:  Your physician recommends that you continue on your current medications as directed. Please refer to the Current Medication list given to you today.  Follow-Up: Follow up in 6 months with Dr. Hochrein.  You will receive a letter in the mail 2 months before you are due.  Please call us when you receive this letter to schedule your follow up appointment.  Thank you for choosing [facility]!!       

## 2015-06-16 ENCOUNTER — Other Ambulatory Visit: Payer: Self-pay | Admitting: Cardiology

## 2015-07-01 ENCOUNTER — Encounter: Payer: Self-pay | Admitting: Surgery

## 2015-07-05 ENCOUNTER — Ambulatory Visit (HOSPITAL_COMMUNITY)
Admission: RE | Admit: 2015-07-05 | Discharge: 2015-07-05 | Disposition: A | Discharge status: Home / Self Care | Payer: Medicare Other | Source: Ambulatory Visit | Attending: Surgery | Admitting: Surgery

## 2015-07-05 ENCOUNTER — Encounter: Payer: Self-pay | Admitting: Surgery

## 2015-07-05 ENCOUNTER — Ambulatory Visit (INDEPENDENT_AMBULATORY_CARE_PROVIDER_SITE_OTHER): Payer: Medicare Other | Admitting: Surgery

## 2015-07-05 VITALS — BP 153/70 | HR 70 | Temp 98.1°F | Resp 16 | Ht 64.0 in | Wt 155.0 lb

## 2015-07-05 DIAGNOSIS — I6523 Occlusion and stenosis of bilateral carotid arteries: Secondary | ICD-10-CM

## 2015-07-05 DIAGNOSIS — I2510 Atherosclerotic heart disease of native coronary artery without angina pectoris: Secondary | ICD-10-CM | POA: Diagnosis not present

## 2015-07-05 DIAGNOSIS — F17200 Nicotine dependence, unspecified, uncomplicated: Secondary | ICD-10-CM | POA: Diagnosis not present

## 2015-07-05 DIAGNOSIS — I6521 Occlusion and stenosis of right carotid artery: Secondary | ICD-10-CM | POA: Diagnosis not present

## 2015-07-05 NOTE — Addendum Note (Signed)
Addended by: Adria Dill L on: 07/05/2015 05:39 PM   Modules accepted: Orders

## 2015-07-05 NOTE — Progress Notes (Signed)
Filed Vitals:   07/05/15 1127 07/05/15 1130 07/05/15 1136 07/05/15 1138  BP: 159/72 163/79 165/78 153/70  Pulse: 71 70 74 70  Temp:  98.1 F (36.7 C)    TempSrc:  Oral    Resp:  16    Height:  5\' 4"  (1.626 m)    Weight:  155 lb (70.308 kg)    SpO2:  98%

## 2015-07-05 NOTE — Progress Notes (Signed)
Patient name: Ronnie Harvey MRN: [id number] DOB: Dec 30, 1945 Sex: male     Chief Complaint  Patient presents with  . Re-evaluation    6 mo Carotid Stenosis f/u     S/P Left CEA  12-03-14    HISTORY OF PRESENT ILLNESS: The patient is back for follow-up.  He is status post left carotid endarterectomy with bovine pericardial patch angioplasty as well as resection of the common carotid artery with primary anastomosis on 12/03/2014.  This was done for asymptomatic stenosis.  Intraoperative findings included a 90% stenosis.  The patient also had significant inflammatory changes on both the inside and outside of the artery.  All surfaces were very friable.  He initially had a marginal mandibular neurapraxia, however this has resolved.  He does complain of some photosensitivity on the left.  Past Medical History  Diagnosis Date  . ETOH abuse     Quit 1998  . Tobacco abuse     Quit Dec 2012  . Cardiomyopathy, dilated (HCC)     a. 09/2012 Echo: EF 15-20%, diff HK, gr2 DD, Triv AI, Mod MR, mild TR/PR, mod-sev dil LA.  [name] PAF (paroxysmal atrial fibrillation) (HCC)   . PVD (peripheral vascular disease) (HCC)     a. 05/2013 s/p PTA/self-expanding stenting of the REIA and LCIA.  [name] CAD (coronary artery disease) nonobstructive    a. 10/2012 Cath: LM 50, LAD 25p, 64m, Diag 69m, LCX 30p, 40d, OM1 60-70, OM3 30, RCA 40-66m, AM 70p, PDA nl, EF 20%.  . CHF (congestive heart failure) (HCC)   . Myocardial infarction (HCC)   . Shortness of breath dyspnea     with exertion  . GERD (gastroesophageal reflux disease)     rarely (worse when he was drinking)  . Arthritis   . Diverticulitis   . Retinal hole of right eye   . Itchy skin     Past Surgical History  Procedure Laterality Date  . Neck cyst resection    . Vasectomy    . Abdominal aortagram N/A 05/28/2013    Procedure: ABDOMINAL Ronny Flurry;  Surgeon: Iran Ouch, MD;  Location: [facility] CATH LAB;  Service: Cardiovascular;  Laterality: N/A;  .  Percutaneous stent intervention Right 05/28/2013    Procedure: PERCUTANEOUS STENT INTERVENTION;  Surgeon: Iran Ouch, MD;  Location: [facility] CATH LAB;  Service: Cardiovascular;  Laterality: Right;  . Carotid angiogram Bilateral 10/27/2014    Procedure: CAROTID ANGIOGRAM;  Surgeon: Nada Libman, MD;  Location: [facility] CATH LAB;  Service: Cardiovascular;  Laterality: Bilateral;  . Eye surgery Right     cataract surgery with lens implant  . Endarterectomy Left 12/03/2014    Procedure: ENDARTERECTOMY CAROTID WITH PATCH ANGIOPLASTY;  Surgeon: Nada Libman, MD;  Location: [facility] OR;  Service: Vascular;  Laterality: Left;  . Carotid endarterectomy Left December 03, 2014    CEA    Social History   Social History  . Marital Status: Married    Spouse Name: N/A  . Number of Children: N/A  . Years of Education: N/A   Occupational History  . Not on file.   Social History Main Topics  . Smoking status: Former Smoker -- 1.00 packs/day for 49 years    Types: Cigarettes    Start date: 08/30/2012    Quit date: 11/24/2012  . Smokeless tobacco: Former Neurosurgeon    Types: Snuff  . Alcohol Use: No     Comment: Former ETOH.  None since 1998 Prior to 1998 was  a heavy drinker  . Drug Use: No  . Sexual Activity: Not on file   Other Topics Concern  . Not on file   Social History Narrative    Family History  Problem Relation Age of Onset  . [id number] Mother   . Diabetes Father   . Diabetes Brother   . COPD Brother   . Cancer Sister 60    breast  . Diabetes Brother     Allergies as of 07/05/2015  . (No Known Allergies)    Current Outpatient Prescriptions on File Prior to Visit  Medication Sig Dispense Refill  . aspirin EC 81 MG tablet Take 1 tablet (81 mg total) by mouth daily.    [name] atorvastatin (LIPITOR) 40 MG tablet Take 40 mg by mouth daily.    . carvedilol (COREG) 25 MG tablet TAKE 1 TABLET TWICE A DAY WITH MEALS 180 tablet 2  . furosemide (LASIX) 20 MG tablet Take 1 tablet (20 mg total) by  mouth daily. 90 tablet 3  . losartan (COZAAR) 50 MG tablet Take 1 tablet (50 mg total) by mouth 2 (two) times daily. 180 tablet 3  . spironolactone (ALDACTONE) 50 MG tablet Take 1 tablet (50 mg total) by mouth daily. 90 tablet 3   No current facility-administered medications on file prior to visit.     REVIEW OF SYSTEMS: See history of present illness, otherwise negative  PHYSICAL EXAMINATION:   Vital signs are  Filed Vitals:   07/05/15 1127 07/05/15 1130 07/05/15 1136 07/05/15 1138  BP: 159/72 163/79 165/78 153/70  Pulse: 71 70 74 70  Temp:  98.1 F (36.7 C)    TempSrc:  Oral    Resp:  16    Height:   (1.626 m)    Weight:  155 lb (70.308 kg)    SpO2:  98%     Body mass index is 26.59 kg/(m^2). General: The patient appears their stated age. HEENT:  No gross abnormalities Pulmonary:  Non labored breathing Musculoskeletal: There are no major deformities. Neurologic: No focal weakness or paresthesias are detected, Skin: There are no ulcer or rashes noted. Psychiatric: The patient has normal affect. Cardiovascular: There is a regular rate and rhythm without significant murmur appreciated.  No carotid bruits.  Palpable radial pulses bilaterally   Diagnostic Studies I have reviewed his carotid duplex from today.  This shows 60-79% stenosis of the right internal carotid artery.  The left internal carotid artery could not be identified.  There was dampened flow within the common carotid artery and elevated flow within the external carotid artery.  These findings support the possibility of left carotid occlusion.  Assessment: Status post left carotid endarterectomy Plan: Given the ultrasound findings, I think that he needs a CT angiogram to determine whether or not his carotid is occluded.  Fortunately he has not had any symptoms consistent with a stroke with the exception of his photosensitivity.  Given the amount of friability and inflammation that was found at the time of his  operation and it is certainly conceivable that this would be the source of his occlusion.  I will have him follow up after her CT angiogram.  Jorge Ny, M.D. [facility] Office: [phone] Pager:  [phone]

## 2015-07-07 ENCOUNTER — Ambulatory Visit (INDEPENDENT_AMBULATORY_CARE_PROVIDER_SITE_OTHER): Payer: Medicare Other | Admitting: Ophthalmology

## 2015-07-07 DIAGNOSIS — H35033 Hypertensive retinopathy, bilateral: Secondary | ICD-10-CM

## 2015-07-07 DIAGNOSIS — I10 Essential (primary) hypertension: Secondary | ICD-10-CM

## 2015-07-07 DIAGNOSIS — H35341 Macular cyst, hole, or pseudohole, right eye: Secondary | ICD-10-CM

## 2015-07-07 DIAGNOSIS — H43813 Vitreous degeneration, bilateral: Secondary | ICD-10-CM

## 2015-07-07 DIAGNOSIS — H35371 Puckering of macula, right eye: Secondary | ICD-10-CM | POA: Diagnosis not present

## 2015-07-13 ENCOUNTER — Telehealth: Payer: Self-pay | Admitting: Cardiology

## 2015-07-13 ENCOUNTER — Other Ambulatory Visit: Payer: Self-pay | Admitting: *Deleted

## 2015-07-13 ENCOUNTER — Telehealth: Payer: Self-pay | Admitting: *Deleted

## 2015-07-13 MED ORDER — SPIRONOLACTONE 50 MG PO TABS: 50.0000 mg | ORAL_TABLET | Freq: Every day | ORAL | Status: DC

## 2015-07-13 NOTE — Telephone Encounter (Signed)
Pt called needing medication, there was confusion as to why he was completely out of his medication, upon further investigation it became known that the pt had not [facility], [facility] per Ronnie Harvey stated they voided the refills on this medication due to the fact that they requested a refill in Aug, it was denied due to refills being on file with them, Spoke to [employer] and she stated they informed the pt of this but Ronnie Harvey was unaware of any of this, i have since sent a new script to get him to March and also a Script to his local pharmacy to have his medication until [facility] sends out the medication.

## 2015-07-13 NOTE — Telephone Encounter (Signed)
Pt called wanting a refill on his aldactone, he has refills on file for a year from his Feb appt, he says that he is completely out of medication, i do not see any documentation of a med change, he is a hochrein pt, please contact pt and regarding this, he was confused as to why he was out of a 3 month supply of medication and said that he only takes as instructed.

## 2015-07-13 NOTE — Telephone Encounter (Signed)
°  STAT if patient is at the pharmacy , call can be transferred to refill team.   1. Which medications need to be refilled? Spironolactone-need enough from local pharmacy until mail order comes  2. Which pharmacy/location is medication to be sent to?[facility] and [facility]. Do they need a 30 day or 90 day supply?90 and refills for [facility] and 30 for [facility]

## 2015-07-13 NOTE — Telephone Encounter (Signed)
Pt aware of refills being sent.

## 2015-07-14 ENCOUNTER — Ambulatory Visit (HOSPITAL_COMMUNITY)
Admission: RE | Admit: 2015-07-14 | Discharge: 2015-07-14 | Disposition: A | Discharge status: Home / Self Care | Payer: Medicare Other | Source: Ambulatory Visit | Attending: Surgery | Admitting: Surgery

## 2015-07-14 ENCOUNTER — Encounter: Payer: Self-pay | Admitting: Surgery

## 2015-07-14 DIAGNOSIS — I6523 Occlusion and stenosis of bilateral carotid arteries: Secondary | ICD-10-CM | POA: Insufficient documentation

## 2015-07-14 DIAGNOSIS — I708 Atherosclerosis of other arteries: Secondary | ICD-10-CM | POA: Insufficient documentation

## 2015-07-14 DIAGNOSIS — I6501 Occlusion and stenosis of right vertebral artery: Secondary | ICD-10-CM | POA: Diagnosis not present

## 2015-07-14 LAB — POCT I-STAT CREATININE: Creatinine, Ser: 1.1 mg/dL (ref 0.61–1.24)

## 2015-07-14 MED ORDER — IOHEXOL 300 MG/ML  SOLN
75.0000 mL | Freq: Once | INTRAMUSCULAR | Status: AC | PRN
  Administered 2015-07-14: 75 mL via INTRAVENOUS

## 2015-07-19 ENCOUNTER — Ambulatory Visit: Payer: Medicare Other | Admitting: Surgery

## 2015-07-22 ENCOUNTER — Encounter: Payer: Self-pay | Admitting: Surgery

## 2015-07-23 ENCOUNTER — Telehealth (HOSPITAL_COMMUNITY): Payer: Self-pay | Admitting: Cardiovascular Disease

## 2015-07-26 ENCOUNTER — Ambulatory Visit (INDEPENDENT_AMBULATORY_CARE_PROVIDER_SITE_OTHER): Payer: Medicare Other | Admitting: Surgery

## 2015-07-26 ENCOUNTER — Encounter: Payer: Self-pay | Admitting: Surgery

## 2015-07-26 VITALS — BP 142/76 | HR 66 | Temp 98.1°F | Resp 16 | Ht 63.5 in | Wt 155.7 lb

## 2015-07-26 DIAGNOSIS — Z48812 Encounter for surgical aftercare following surgery on the circulatory system: Secondary | ICD-10-CM | POA: Diagnosis not present

## 2015-07-26 DIAGNOSIS — I6523 Occlusion and stenosis of bilateral carotid arteries: Secondary | ICD-10-CM

## 2015-07-26 DIAGNOSIS — I63232 Cerebral infarction due to unspecified occlusion or stenosis of left carotid arteries: Secondary | ICD-10-CM | POA: Diagnosis not present

## 2015-07-26 NOTE — Progress Notes (Signed)
History of Present Illness:  Patient is a 69 y.o. year old male who presents for evaluation of Of his left carotid artery S/P left CEA 11/2014.  This was done for asymptomatic stenosis. Intraoperative findings included a 90% stenosis. According to Dr. Estanislado Spire note he had significant inflammatory changes on both the inside and outside of the artery. All surfaces were very friable. He initially had a marginal mandibular neurapraxia, however this has resolved. He does complain of some photosensitivity on the left.  After his last visit on 07/05/2015 Dr. Myra Gianotti decided to order a a CT angiogram to determine whether or not his carotid is occluded.  He is here today for review of his CTA.  He states his photosensitivity has resolved and he has seen an opthomologist who reports he has cataracts. Other medical problems include has SBO (small bowel obstruction) (HCC); Acute respiratory failure due to pulmonary edema; Paroxysmal AF; COPD (chronic obstructive pulmonary disease) (HCC); CAD (coronary artery disease); Cardiomyopathy (HCC); Diverticulitis; Pulmonary edema; Smoker; Bowel microperforation; Positive blood culture; Hypernatremia; Acute systolic CHF (congestive heart failure) (HCC); Hypokalemia; Peripheral arterial disease (HCC); Hematoma; Nonischemic cardiomyopathy (HCC); Hyperlipidemia; Bilateral carotid artery stenosis; Carotid stenosis; and Aftercare following surgery of the circulatory system on his problem list.  Past Medical History  Diagnosis Date  . ETOH abuse     Quit 1998  . Tobacco abuse     Quit Dec 2012  . Cardiomyopathy, dilated (HCC)     a. 09/2012 Echo: EF 15-20%, diff HK, gr2 DD, Triv AI, Mod MR, mild TR/PR, mod-sev dil LA.  [name] PAF (paroxysmal atrial fibrillation) (HCC)   . PVD (peripheral vascular disease) (HCC)     a. 05/2013 s/p PTA/self-expanding stenting of the REIA and LCIA.  [name] CAD (coronary artery disease) nonobstructive    a. 10/2012 Cath: LM 50, LAD 25p, 64m, Diag  35m, LCX 30p, 40d, OM1 60-70, OM3 30, RCA 40-55m, AM 70p, PDA nl, EF 20%.  . CHF (congestive heart failure) (HCC)   . Myocardial infarction (HCC)   . Shortness of breath dyspnea     with exertion  . GERD (gastroesophageal reflux disease)     rarely (worse when he was drinking)  . Arthritis   . Diverticulitis   . Retinal hole of right eye   . Itchy skin     Past Surgical History  Procedure Laterality Date  . Neck cyst resection    . Vasectomy    . Abdominal aortagram N/A 05/28/2013    Procedure: ABDOMINAL Ronny Flurry;  Surgeon: Iran Ouch, MD;  Location: [facility] CATH LAB;  Service: Cardiovascular;  Laterality: N/A;  . Percutaneous stent intervention Right 05/28/2013    Procedure: PERCUTANEOUS STENT INTERVENTION;  Surgeon: Iran Ouch, MD;  Location: [facility] CATH LAB;  Service: Cardiovascular;  Laterality: Right;  . Carotid angiogram Bilateral 10/27/2014    Procedure: CAROTID ANGIOGRAM;  Surgeon: Nada Libman, MD;  Location: [facility] CATH LAB;  Service: Cardiovascular;  Laterality: Bilateral;  . Eye surgery Right     cataract surgery with lens implant  . Endarterectomy Left 12/03/2014    Procedure: ENDARTERECTOMY CAROTID WITH PATCH ANGIOPLASTY;  Surgeon: Nada Libman, MD;  Location: [facility] OR;  Service: Vascular;  Laterality: Left;  . Carotid endarterectomy Left December 03, 2014    CEA    Social History Social History  Substance Use Topics  . Smoking status: Former Smoker -- 1.00 packs/day for 49 years    Types: Cigarettes    Start date: 08/30/2012  Quit date: 11/24/2012  . Smokeless tobacco: Former Neurosurgeon    Types: Snuff  . Alcohol Use: No     Comment: Former ETOH.  None since 1998 Prior to 1998 was a heavy drinker    Family History Family History  Problem Relation Age of Onset  . Diabetes Mother   . Diabetes Father   . Diabetes Brother   . COPD Brother   . Cancer Sister 47    breast  . Diabetes Brother     Allergies  No Known Allergies   Current Outpatient  Prescriptions  Medication Sig Dispense Refill  . aspirin EC 81 MG tablet Take 1 tablet (81 mg total) by mouth daily.    [name] atorvastatin (LIPITOR) 40 MG tablet Take 40 mg by mouth daily.    . carvedilol (COREG) 25 MG tablet TAKE 1 TABLET TWICE A DAY WITH MEALS 180 tablet 2  . furosemide (LASIX) 20 MG tablet Take 1 tablet (20 mg total) by mouth daily. 90 tablet 3  . losartan (COZAAR) 50 MG tablet Take 1 tablet (50 mg total) by mouth 2 (two) times daily. 180 tablet 3  . spironolactone (ALDACTONE) 50 MG tablet Take 1 tablet (50 mg total) by mouth daily. 30 tablet 0  . spironolactone (ALDACTONE) 50 MG tablet Take 1 tablet (50 mg total) by mouth daily. 90 tablet 1   No current facility-administered medications for this visit.    ROS:   General:  No weight loss, Fever, chills  HEENT: No recent headaches, no nasal bleeding, no visual changes, no sore throat  Neurologic: No dizziness, blackouts, seizures. No recent symptoms of stroke or mini- stroke. No recent episodes of slurred speech, or temporary blindness.  Cardiac: No recent episodes of chest pain/pressure, no shortness of breath at rest.  No shortness of breath with exertion.  Denies history of atrial fibrillation or irregular heartbeat  Vascular: No history of rest pain in feet.  No history of claudication.  No history of non-healing ulcer, No history of DVT   Pulmonary: No home oxygen, no productive cough, no hemoptysis,  No asthma or wheezing  Musculoskeletal:  [x ] Arthritis,  Low back pain,   Joint pain  Hematologic:No history of hypercoagulable state.  No history of easy bleeding.  No history of anemia  Gastrointestinal: No hematochezia or melena,  No gastroesophageal reflux, no trouble swallowing  Urinary:  chronic Kidney disease,  on HD -  MWF or  TTHS,  Burning with urination,  Frequent urination,  Difficulty urinating;   Skin: No rashes  Psychological: No history of anxiety,  No history of  depression   Physical Examination  Filed Vitals:   07/26/15 1230 07/26/15 1231 07/26/15 1233  BP: 152/67 151/75 142/76  Pulse: 66    Temp: 98.1 F (36.7 C)    TempSrc: Oral    Resp: 16    Height: 5' 3.5" (1.613 m)    Weight: 155 lb 11.2 oz (70.625 kg)      Body mass index is 27.14 kg/(m^2).  General:  Alert and oriented, no acute distress HEENT: Normal Neck: No bruit  Pulmonary: Clear to auscultation bilaterally Cardiac: Regular Rate and Rhythm without murmur Abdomen: Soft, non-tender, non-distended, no mass, no scars Skin: No rash Extremity Pulses:  2+ radial Musculoskeletal: No deformity or edema  Neurologic: Upper and lower extremity motor 5/5 and symmetric  DATA:   CTA  Neck 07/19/2015 IMPRESSION: 1. The left internal carotid artery  is occluded 1.3 cm beyond the bifurcation. It is reconstituted at the level of the petrous segment. 2. Atherosclerotic changes at the right carotid bifurcation without significant stenosis. The minimal luminal diameter is 4 mm. 3. 50% stenosis of the proximal left subclavian artery. 4. High-grade stenosis of proximal right subclavian artery. 5. High-grade stenosis of the dominant right vertebral artery origin. 6. 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 7. The left vertebral artery reconstitutes from the vertebrobasilar Junction. The CTA was reviewed with Dr. Myra Gianotti today  ASSESSMENT:   S/P lest CEA the left carotid is occluded 1.3 cm above the bifurcation.  Right carotid without focal stenosis.   PLAN:  Other than light sensitivity he is asymptomatic without history of stroke.  We will continue to follow his right carotid with carotid duplexes at this time every 6 months due to the amount of friability and inflammation that was found at the time of his operation.  If he becomes symptomatic or stenosis develops > 80%b on the right carotid we will likely stent his right side.  If he has symptoms of  stroke from the right side he will call us immediately or call 911.  Kaleisha Bhargava MAUREEN PA-C [facility]  He was seen in conjunction with Dr. Myra Gianotti today.  I agree with the above.  I have seen and evaluated the patient.  He underwent left carotid endarterectomy with patch angioplasty on 12/03/2014 for asymptomatic stenosis.  Intraoperatively there was a 90% stenosis.  There were significant inflammatory changes on both the inside of the outside of the artery with very friable surfaces.  When he came in last week for his duplex, there was the possibility of an occlusion.  He is asymptomatic.  I sent him for a CT scan to confirm whether or not he had an occlusion.  CT scan today confirmed the carotid occlusion.  I discussed the significance of this and the need for close monitoring of the other side.  Because of the amount of friability and inflammation around his artery I would consider stenting the other side if he gets to that point.  He will follow up in 6 months.  Durene Cal

## 2015-07-26 NOTE — Addendum Note (Signed)
Addended by: Adria Dill L on: 07/26/2015 05:21 PM   Modules accepted: Orders

## 2015-07-26 NOTE — Progress Notes (Signed)
Filed Vitals:   07/26/15 1230 07/26/15 1231 07/26/15 1233  BP: 152/67 151/75 142/76  Pulse: 66    Temp: 98.1 F (36.7 C)    TempSrc: Oral    Resp: 16    Height: 5' 3.5" (1.613 m)    Weight: 155 lb 11.2 oz (70.625 kg)

## 2015-11-10 ENCOUNTER — Encounter: Payer: Self-pay | Admitting: Cardiology

## 2015-11-10 ENCOUNTER — Ambulatory Visit (INDEPENDENT_AMBULATORY_CARE_PROVIDER_SITE_OTHER): Payer: Medicare Other | Admitting: Cardiology

## 2015-11-10 VITALS — BP 125/71 | HR 72 | Ht 64.0 in | Wt 165.0 lb

## 2015-11-10 DIAGNOSIS — I428 Other cardiomyopathies: Secondary | ICD-10-CM

## 2015-11-10 DIAGNOSIS — E785 Hyperlipidemia, unspecified: Secondary | ICD-10-CM | POA: Diagnosis not present

## 2015-11-10 DIAGNOSIS — I429 Cardiomyopathy, unspecified: Secondary | ICD-10-CM | POA: Diagnosis not present

## 2015-11-10 NOTE — Progress Notes (Signed)
HPI The patient presents for followup of cardiomyopathy. He was seen in consultation by [place] at the time of diverticulitis and bowel microperforation a few years ago. He had atrial fibrillation paroxysmally in as part of this hospitalization. He had respiratory failure and required intubation. His ejection fraction was 20%.  He was not anticoagulated at that time because of his ongoing acute issues. Cardiac catheterization which demonstrated left main 50% stenosis, LAD 40% stenosis, circumflex 30% followed by distal 40% stenosis, small obtuse marginal 60-70% stenosis and right coronary artery 40-50% stenosis. The EF was 20%.  He has also had PTA and stenting of his right iliac and left common iliac by Dr. Kirke Corin.  I did repeat an echo last in 2015 and the EF is about 30% which is slightly better than his lowest EF.  He is status post CEA.  However, recent angiography demonstrates this to now be occluded on the left.  He has only mild disease on the right with some subclavian disease.  The patient denies any new symptoms such as chest discomfort, neck or arm discomfort. There has been no new shortness of breath, PND or orthopnea. There have been no reported palpitations, presyncope or syncope.  He says he is active around his house.  He does walk for exercise.  Unfortunately his wife has just been diagnosed with breast cancer.    Current Outpatient Prescriptions  Medication Sig Dispense Refill  . aspirin EC 81 MG tablet Take 1 tablet (81 mg total) by mouth daily.    [name] atorvastatin (LIPITOR) 40 MG tablet Take 40 mg by mouth daily.    . carvedilol (COREG) 25 MG tablet Take 25 mg by mouth 2 (two) times daily with a meal.    . furosemide (LASIX) 20 MG tablet Take 1 tablet (20 mg total) by mouth daily. 90 tablet 3  . losartan (COZAAR) 50 MG tablet Take 1 tablet (50 mg total) by mouth 2 (two) times daily. 180 tablet 3  . spironolactone (ALDACTONE) 50 MG tablet Take 1 tablet (50 mg total) by mouth daily. 90  tablet 1   No current facility-administered medications for this visit.    Past Medical History  Diagnosis Date  . ETOH abuse     Quit 1998  . Tobacco abuse     Quit Dec 2012  . Cardiomyopathy, dilated (HCC)     a. 09/2012 Echo: EF 15-20%, diff HK, gr2 DD, Triv AI, Mod MR, mild TR/PR, mod-sev dil LA.  [name] PAF (paroxysmal atrial fibrillation) (HCC)   . PVD (peripheral vascular disease) (HCC)     a. 05/2013 s/p PTA/self-expanding stenting of the REIA and LCIA.  [name] CAD (coronary artery disease) nonobstructive    a. 10/2012 Cath: LM 50, LAD 25p, 76m, Diag 55m, LCX 30p, 40d, OM1 60-70, OM3 30, RCA 40-61m, AM 70p, PDA nl, EF 20%.  . CHF (congestive heart failure) (HCC)   . Myocardial infarction (HCC)   . Shortness of breath dyspnea     with exertion  . GERD (gastroesophageal reflux disease)     rarely (worse when he was drinking)  . Arthritis   . Diverticulitis   . Retinal hole of right eye   . Itchy skin     Past Surgical History  Procedure Laterality Date  . Neck cyst resection    . Vasectomy    . Abdominal aortagram N/A 05/28/2013    Procedure: ABDOMINAL Ronny Flurry;  Surgeon: Iran Ouch, MD;  Location: [facility] CATH LAB;  Service: Cardiovascular;  Laterality: N/A;  . Percutaneous stent intervention Right 05/28/2013    Procedure: PERCUTANEOUS STENT INTERVENTION;  Surgeon: Iran Ouch, MD;  Location: [facility] CATH LAB;  Service: Cardiovascular;  Laterality: Right;  . Carotid angiogram Bilateral 10/27/2014    Procedure: CAROTID ANGIOGRAM;  Surgeon: Nada Libman, MD;  [facility] CATH LAB;  Service: Cardiovascular;  Laterality: Bilateral;  . Eye surgery Right     cataract surgery with lens implant  . Endarterectomy Left 12/03/2014    Procedure: ENDARTERECTOMY CAROTID WITH PATCH ANGIOPLASTY;  Surgeon: Nada Libman, MD;  Location: [facility] OR;  Service: Vascular;  Laterality: Left;  . Carotid endarterectomy Left December 03, 2014    CEA    ROS:  As stated in the HPI and negative for all other  systems.  PHYSICAL EXAM BP 125/71 mmHg  Pulse 72  Ht  (1.626 m)  Wt 165 lb (74.844 kg)  BMI 28.31 kg/m2 GENERAL:  Well appearing HEENT: PERRL NECK:  No jugular venous distention, waveform within normal limits, carotid upstroke brisk and symmetric, right carotid bruits, no thyromegaly LUNGS:  Clear to auscultation bilaterally HEART:  PMI not displaced or sustained,S1 and S2 within normal limits, no S3, no S4, no clicks, no rubs, no murmurs ABD:  Flat, positive bowel sounds normal in frequency in pitch, positive midline soft bruits, no rebound, no guarding, no midline pulsatile mass, no hepatomegaly, no splenomegaly EXT:  2 plus pulses upper, diminished bilateral lower, left femoral bruit no edema, no cyanosis no clubbing NEURO:  Nonfocal 61 EKG:  Sinus rhythm, rate 62, axis within normal limits, minimal will criteria for left ventricular hypertrophy with nonspecific ST-T wave changes.. 11/10/2015  ASSESSMENT AND PLAN  Cardiomyopathy He has class I symptoms.  No change in therapy is indicated.  I will repeat an echo for a baseline EF as it has been two years.   Atrial fibrillation He has had no recurrent dysrhythmias since stopping ETOH.   Atrial fib was seen only during his acute illness.  No anticoagulation is indicated.   CAD This is nonobstructive.  He will continue to be managed with risk reduction.  HTN The blood pressure is at target. No change in medications is indicated. We will continue with therapeutic lifestyle changes (TLC).  CAROTID STENOSIS He is status post CEA.  This is now occluded.  He is followed by Dr. Myra Gianotti  PVD He has had symptom relief.   We will continue with risk reduction.   DYSLIPIDEMIA He is overdue for follow up of a lipid profile and I have ordered this.  The goal will be an LDL less than 70

## 2015-11-10 NOTE — Patient Instructions (Signed)
Medication Instructions:  The current medical regimen is effective;  continue present plan and medications.  Labwork: Please have fasting blood work at [facility]. (Lipid)  Testing/Procedures: Your physician has requested that you have an echocardiogram. Echocardiography is a painless test that uses sound waves to create images of your heart. It provides your doctor with information about the size and shape of your heart and how well your heart's chambers and valves are working. This procedure takes approximately one hour. There are no restrictions for this procedure.  Follow-Up: Follow up in 6 months with Dr. Antoine Poche in [place].  You will receive a letter in the mail 2 months before you are due.  Please call us when you receive this letter to schedule your follow up appointment.  If you need a refill on your cardiac medications before your next appointment, please call your pharmacy.  Thank you for choosing [facility]!!

## 2015-11-11 ENCOUNTER — Other Ambulatory Visit: Payer: Medicare Other

## 2015-11-11 ENCOUNTER — Encounter: Payer: Self-pay | Admitting: Cardiology

## 2015-11-11 DIAGNOSIS — E785 Hyperlipidemia, unspecified: Secondary | ICD-10-CM

## 2015-11-11 NOTE — Progress Notes (Signed)
Labs for dr hochrein Lipid E78.5

## 2015-11-12 LAB — LIPID PANEL
CHOLESTEROL TOTAL: 137 mg/dL (ref 100–199)
Chol/HDL Ratio: 4.7 ratio units (ref 0.0–5.0)
HDL: 29 mg/dL — AB (ref >39)
LDL Calculated: 75 mg/dL (ref 0–99)
Triglycerides: 164 mg/dL — ABNORMAL HIGH (ref 0–149)
VLDL CHOLESTEROL CAL: 33 mg/dL (ref 5–40)

## 2015-11-18 ENCOUNTER — Other Ambulatory Visit: Payer: Self-pay

## 2015-11-18 ENCOUNTER — Ambulatory Visit (INDEPENDENT_AMBULATORY_CARE_PROVIDER_SITE_OTHER): Payer: Medicare Other

## 2015-11-18 DIAGNOSIS — I428 Other cardiomyopathies: Secondary | ICD-10-CM

## 2015-11-18 DIAGNOSIS — I429 Cardiomyopathy, unspecified: Secondary | ICD-10-CM

## 2015-12-13 ENCOUNTER — Other Ambulatory Visit: Payer: Self-pay | Admitting: Cardiology

## 2015-12-13 NOTE — Telephone Encounter (Signed)
Rx refill sent to pharmacy. 

## 2015-12-27 ENCOUNTER — Other Ambulatory Visit: Payer: Self-pay | Admitting: Cardiology

## 2015-12-27 NOTE — Telephone Encounter (Signed)
REFILL 

## 2016-01-14 ENCOUNTER — Encounter: Payer: Self-pay | Admitting: Surgery

## 2016-01-16 ENCOUNTER — Other Ambulatory Visit: Payer: Self-pay | Admitting: Cardiology

## 2016-01-18 NOTE — Telephone Encounter (Signed)
REFILL 

## 2016-01-24 ENCOUNTER — Ambulatory Visit (INDEPENDENT_AMBULATORY_CARE_PROVIDER_SITE_OTHER): Payer: Medicare Other | Admitting: Surgery

## 2016-01-24 ENCOUNTER — Encounter: Payer: Self-pay | Admitting: Surgery

## 2016-01-24 ENCOUNTER — Ambulatory Visit (HOSPITAL_COMMUNITY)
Admission: RE | Admit: 2016-01-24 | Discharge: 2016-01-24 | Disposition: A | Discharge status: Home / Self Care | Payer: Medicare Other | Source: Ambulatory Visit | Attending: Surgery | Admitting: Surgery

## 2016-01-24 VITALS — BP 133/71 | HR 67 | Ht 64.0 in | Wt 160.8 lb

## 2016-01-24 DIAGNOSIS — I6523 Occlusion and stenosis of bilateral carotid arteries: Secondary | ICD-10-CM

## 2016-01-24 DIAGNOSIS — I6521 Occlusion and stenosis of right carotid artery: Secondary | ICD-10-CM | POA: Insufficient documentation

## 2016-01-24 DIAGNOSIS — Z87891 Personal history of nicotine dependence: Secondary | ICD-10-CM | POA: Diagnosis not present

## 2016-01-24 DIAGNOSIS — Z48812 Encounter for surgical aftercare following surgery on the circulatory system: Secondary | ICD-10-CM

## 2016-01-24 DIAGNOSIS — I63232 Cerebral infarction due to unspecified occlusion or stenosis of left carotid arteries: Secondary | ICD-10-CM | POA: Diagnosis not present

## 2016-01-24 DIAGNOSIS — I509 Heart failure, unspecified: Secondary | ICD-10-CM | POA: Insufficient documentation

## 2016-01-24 DIAGNOSIS — K219 Gastro-esophageal reflux disease without esophagitis: Secondary | ICD-10-CM | POA: Insufficient documentation

## 2016-01-24 DIAGNOSIS — I2510 Atherosclerotic heart disease of native coronary artery without angina pectoris: Secondary | ICD-10-CM | POA: Diagnosis not present

## 2016-01-24 NOTE — Progress Notes (Signed)
[facility] [place]  Patient name: Ronnie Harvey MRN: [id number] DOB: 03-04-1946 Sex: male  REASON FOR VISIT: follow up  HPI: Ronnie Harvey is a 70 y.o. male who underwent left carotid endarterectomy with patch angioplasty on 12/03/2014 for asymptomatic stenosis. Intraoperatively there was a 90% stenosis. There were significant inflammatory changes on both the inside of the outside of the artery with very friable surfaces. When he came in last week for his duplex, there was the possibility of an occlusion. He is asymptomatic. I sent him for a CT scan to confirm whether or not he had an occlusion. CT scan  confirmed the carotid occlusion.  He is back today for follow-up.  He has no complaints.  Past Medical History  Diagnosis Date  . ETOH abuse     Quit 1998  . Tobacco abuse     Quit Dec 2012  . Cardiomyopathy, dilated (HCC)     a. 09/2012 Echo: EF 15-20%, diff HK, gr2 DD, Triv AI, Mod MR, mild TR/PR, mod-sev dil LA.  [name] PAF (paroxysmal atrial fibrillation) (HCC)   . PVD (peripheral vascular disease) (HCC)     a. 05/2013 s/p PTA/self-expanding stenting of the REIA and LCIA.  [name] CAD (coronary artery disease) nonobstructive    a. 10/2012 Cath: LM 50, LAD 25p, 70m, Diag 33m, LCX 30p, 40d, OM1 60-70, OM3 30, RCA 40-5m, AM 70p, PDA nl, EF 20%.  . CHF (congestive heart failure) (HCC)   . Myocardial infarction (HCC)   . Shortness of breath dyspnea     with exertion  . GERD (gastroesophageal reflux disease)     rarely (worse when he was drinking)  . Arthritis   . Diverticulitis   . Retinal hole of right eye   . Itchy skin     Family History  Problem Relation Age of Onset  . Diabetes Mother   . Diabetes Father   . Diabetes Brother   . COPD Brother   . Cancer Sister 25    breast  . Diabetes Brother     SOCIAL HISTORY: Social History  Substance Use Topics  . Smoking status: Former Smoker -- 1.00 packs/day for 49 years    Types: Cigarettes   Start date: 08/30/2012    Quit date: 11/24/2012  . Smokeless tobacco: Former Neurosurgeon    Types: Snuff  . Alcohol Use: No     Comment: Former ETOH.  None since 1998 Prior to 1998 was a heavy drinker    No Known Allergies  Current Outpatient Prescriptions  Medication Sig Dispense Refill  . aspirin EC 81 MG tablet Take 1 tablet (81 mg total) by mouth daily.    [name] atorvastatin (LIPITOR) 40 MG tablet TAKE 1 TABLET DAILY 90 tablet 1  . carvedilol (COREG) 25 MG tablet Take 25 mg by mouth 2 (two) times daily with a meal.    . furosemide (LASIX) 20 MG tablet TAKE 1 TABLET DAILY 90 tablet 1  . losartan (COZAAR) 50 MG tablet Take 1 tablet (50 mg total) by mouth 2 (two) times daily. 180 tablet 3  . spironolactone (ALDACTONE) 50 MG tablet TAKE 1 TABLET DAILY 90 tablet 1   No current facility-administered medications for this visit.    REVIEW OF SYSTEMS:  [X]  denotes positive finding, [ ]  denotes negative finding Cardiac  Comments:  Chest pain or chest pressure:    Shortness of breath upon exertion:    Short of breath when lying flat:    Irregular  heart rhythm:        Vascular    Pain in calf, thigh, or hip brought on by ambulation:    Pain in feet at night that wakes you up from your sleep:     Blood clot in your veins:    Leg swelling:         Pulmonary    Oxygen at home:    Productive cough:     Wheezing:         Neurologic    Sudden weakness in arms or legs:     Sudden numbness in arms or legs:     Sudden onset of difficulty speaking or slurred speech:    Temporary loss of vision in one eye:     Problems with dizziness:         Gastrointestinal    Blood in stool:     Vomited blood:         Genitourinary    Burning when urinating:     Blood in urine:        Psychiatric    Major depression:         Hematologic    Bleeding problems:    Problems with blood clotting too easily:        Skin    Rashes or ulcers:        Constitutional    Fever or chills:      PHYSICAL  EXAM: Filed Vitals:   01/24/16 1043 01/24/16 1044  BP: 123/73 133/71  Pulse: 67   Height:  (1.626 m)   Weight: 160 lb 12.8 oz (72.938 kg)   SpO2: 95%     GENERAL: The patient is a well-nourished male, in no acute distress. The vital signs are documented above. CARDIAC: There is a regular rate and rhythm.  PULMONARY: There is good air exchange bilaterally without wheezing or rales. ABDOMEN: Soft and non-tender with normal pitched bowel sounds.  MUSCULOSKELETAL: There are no major deformities or cyanosis. NEUROLOGIC: No focal weakness or paresthesias are detected. SKIN: There are no ulcers or rashes noted. PSYCHIATRIC: The patient has a normal affect.  DATA:  I have reviewed his carotid ultrasound.  He has 40-59% right-sided stenosis.  The left side is occluded  MEDICAL ISSUES: We discussed continuing with routine surveillance.  I will have him follow-up in one year with a carotid ultrasound if he develops symptoms or if his stenosis becomes greater than 80%, I would favor stenting his right carotid artery, given the amount of inflammation on the left side and the fact that he had a early occlusion.    Durene Cal [facility] [employer]: [phone]

## 2016-02-18 ENCOUNTER — Telehealth: Payer: Self-pay | Admitting: Nurse Practitioner

## 2016-02-18 ENCOUNTER — Ambulatory Visit (INDEPENDENT_AMBULATORY_CARE_PROVIDER_SITE_OTHER): Payer: Medicare Other | Admitting: Nurse Practitioner

## 2016-02-18 ENCOUNTER — Encounter: Payer: Self-pay | Admitting: Nurse Practitioner

## 2016-02-18 VITALS — BP 142/70 | HR 72 | Temp 97.2°F | Ht 64.0 in | Wt 161.0 lb

## 2016-02-18 DIAGNOSIS — L03032 Cellulitis of left toe: Secondary | ICD-10-CM | POA: Diagnosis not present

## 2016-02-18 MED ORDER — CIPROFLOXACIN HCL 500 MG PO TABS: 500.0000 mg | ORAL_TABLET | Freq: Two times a day (BID) | ORAL | Status: DC

## 2016-02-18 NOTE — Progress Notes (Signed)
   Subjective:    Patient ID: Ronnie Harvey, male    DOB: May 25, 1946, 70 y.o.   MRN: [id number]  HPI Patient comes in today c/o left toe swelling and erythema- started 2 days ago and swelling and redness has gotten worse.    Review of Systems  Constitutional: Negative.   HENT: Negative.   Respiratory: Negative.   Cardiovascular: Negative.   Genitourinary: Negative.   Neurological: Negative.   Psychiatric/Behavioral: Negative.   All other systems reviewed and are negative.      Objective:   Physical Exam  Constitutional: He is oriented to person, place, and time. He appears well-developed and well-nourished. No distress.  Cardiovascular: Normal rate, normal heart sounds and intact distal pulses.   Pulmonary/Chest: Effort normal and breath sounds normal.  Neurological: He is alert and oriented to person, place, and time.  Skin: Skin is warm.  Erythematous edematous swollen area around medial side of left 2nd nail bed.  Psychiatric: He has a normal mood and affect. His behavior is normal. Judgment and thought content normal.   BP 142/70 mmHg  Pulse 72  Temp(Src) 97.2 F (36.2 C) (Oral)  Ht 5\' 4"  (1.626 m)  Wt 161 lb (73.029 kg)  BMI 27.62 kg/m2        Assessment & Plan:   1. Paronychia of second toe of left foot    Meds ordered this encounter  Medications  . ciprofloxacin (CIPRO) 500 MG tablet    Sig: Take 1 tablet (500 mg total) by mouth 2 (two) times daily.    Dispense:  20 tablet    Refill:  0    Order Specific Question:  Supervising Provider    Answer:  Ernestina Penna [[id number]]   Epsom salt soaks BID Do not pick at area RTO if not improving  Mary-Margaret Daphine Deutscher, FNP

## 2016-02-18 NOTE — Patient Instructions (Signed)
Paronychia °Paronychia is an infection of the skin that surrounds a nail. It usually affects the skin around a fingernail, but it may also occur near a toenail. It often causes pain and swelling around the nail. This condition may come on suddenly or develop over a longer period. In some cases, a collection of pus (abscess) can form near or under the nail. Usually, paronychia is not serious and it clears up with treatment. °CAUSES °This condition may be caused by bacteria or fungi. It is commonly caused by either Streptococcus or Staphylococcus bacteria. The bacteria or fungi often cause the infection by getting into the affected area through an opening in the skin, such as a cut or a hangnail. °RISK FACTORS °This condition is more likely to develop in: °· People who get their hands wet often, such as those who work as dishwashers, bartenders, or nurses. °· People who bite their fingernails or suck their thumbs. °· People who trim their nails too short. °· People who have hangnails or injured fingertips. °· People who get manicures. °· People who have diabetes. °SYMPTOMS °Symptoms of this condition include: °· Redness and swelling of the skin near the nail. °· Tenderness around the nail when you touch the area. °· Pus-filled bumps under the cuticle. The cuticle is the skin at the base or sides of the nail. °· Fluid or pus under the nail. °· Throbbing pain in the area. °DIAGNOSIS °This condition is usually diagnosed with a physical exam. In some cases, a sample of pus may be taken from an abscess to be tested in a lab. This can help to determine what type of bacteria or fungi is causing the condition. °TREATMENT °Treatment for this condition depends on the cause and severity of the condition. If the condition is mild, it may clear up on its own in a few days. Your health care provider may recommend soaking the affected area in warm water a few times a day. When treatment is needed, the options may  include: °· Antibiotic medicine, if the condition is caused by a bacterial infection. °· Antifungal medicine, if the condition is caused by a fungal infection. °· Incision and drainage, if an abscess is present. In this procedure, the health care provider will cut open the abscess so the pus can drain out. °HOME CARE INSTRUCTIONS °· Soak the affected area in warm water if directed to do so by your health care provider. You may be told to do this for 20 minutes, 2-3 times a day. Keep the area dry in between soakings. °· Take medicines only as directed by your health care provider. °· If you were prescribed an antibiotic medicine, finish all of it even if you start to feel better. °· Keep the affected area clean. °· Do not try to drain a fluid-filled bump yourself. °· If you will be washing dishes or performing other tasks that require your hands to get wet, wear rubber gloves. You should also wear gloves if your hands might come in contact with irritating substances, such as cleaners or chemicals. °· Follow your health care provider's instructions about: °¨ Wound care. °¨ Bandage (dressing) changes and removal. °SEEK MEDICAL CARE IF: °· Your symptoms get worse or do not improve with treatment. °· You have a fever or chills. °· You have redness spreading from the affected area. °· You have continued or increased fluid, blood, or pus coming from the affected area. °· Your finger or knuckle becomes swollen or is difficult to move. °  °  This information is not intended to replace advice given to you by your health care provider. Make sure you discuss any questions you have with your health care provider. °  °Document Released: 02/28/2001 Document Revised: 01/19/2015 Document Reviewed: 08/12/2014 °Elsevier Interactive Patient Education ©2016 [employer] ° °

## 2016-02-18 NOTE — Telephone Encounter (Signed)
Pt given appt at 3:45 today with MMM.

## 2016-03-03 NOTE — Addendum Note (Signed)
Addended by: Sharee Pimple on: 03/03/2016 04:52 PM   Modules accepted: Orders

## 2016-03-14 ENCOUNTER — Other Ambulatory Visit: Payer: Self-pay | Admitting: Cardiology

## 2016-05-11 ENCOUNTER — Encounter: Payer: Self-pay | Admitting: Cardiology

## 2016-05-16 ENCOUNTER — Other Ambulatory Visit: Payer: Self-pay | Admitting: Cardiology

## 2016-05-16 NOTE — Telephone Encounter (Signed)
Rx request sent to pharmacy.  

## 2016-05-22 NOTE — Progress Notes (Signed)
HPI The patient presents for followup of cardiomyopathy. He was seen in consultation by us at the time of diverticulitis and bowel microperforation a few years ago. He had atrial fibrillation as part of this hospitalization. He had respiratory failure and required intubation. His ejection fraction was 20%.  He was not anticoagulated at that time because of his ongoing acute issues. Cardiac catheterization demonstrated left main 50% stenosis, LAD 40% stenosis, circumflex 30% followed by distal 40% stenosis, small obtuse marginal 60-70% stenosis and right coronary artery 40-50% stenosis. The EF was 20%.  He has also had PTA and stenting of his right iliac and left common iliac by Dr. Kirke CorinArida.  I did repeat an echo in 2015 and the EF was about 30% and was up to 40 - 45% in March 2017.  He is status post CEA.  However, follow up angiography demonstrates this to now be occluded on the left.  He has only mild disease on the right with some subclavian disease.  His wife was diagnosed with breast cancer earlier this year and since I last saw him she died.  He presents for follow up.  The patient denies any new symptoms such as chest discomfort, neck or arm discomfort. There has been no new shortness of breath, PND or orthopnea. There have been no reported palpitations, presyncope or syncope.  He is not very active around the house.   Current Outpatient Prescriptions  Medication Sig Dispense Refill  . aspirin EC 81 MG tablet Take 1 tablet (81 mg total) by mouth daily.    [name]. atorvastatin (LIPITOR) 40 MG tablet Take 40 mg by mouth daily.    . carvedilol (COREG) 25 MG tablet Take 25 mg by mouth 2 (two) times daily with a meal.    . furosemide (LASIX) 20 MG tablet Take 20 mg by mouth daily.    [name]. losartan (COZAAR) 50 MG tablet Take 50 mg by mouth 2 (two) times daily.    [name]. spironolactone (ALDACTONE) 50 MG tablet Take 50 mg by mouth daily.     No current facility-administered medications for this visit.     Past  Medical History:  Diagnosis Date  . Arthritis   . CAD (coronary artery disease) nonobstructive   a. 10/2012 Cath: LM 50, LAD 25p, 7130m, Diag 5265m, LCX 30p, 40d, OM1 60-70, OM3 30, RCA 40-1753m, AM 70p, PDA nl, EF 20%.  . Cardiomyopathy, dilated (HCC)    a. 09/2012 Echo: EF 15-20%, diff HK, gr2 DD, Triv AI, Mod MR, mild TR/PR, mod-sev dil LA.  . CHF (congestive heart failure) (HCC)   . Diverticulitis   . ETOH abuse    Quit 1998  . GERD (gastroesophageal reflux disease)    rarely (worse when he was drinking)  . Itchy skin   . Myocardial infarction (HCC)   . PAF (paroxysmal atrial fibrillation) (HCC)   . PVD (peripheral vascular disease) (HCC)    a. 05/2013 s/p PTA/self-expanding stenting of the REIA and LCIA.  [name]. Retinal hole of right eye   . Shortness of breath dyspnea    with exertion  . Tobacco abuse    Quit Dec 2012    Past Surgical History:  Procedure Laterality Date  . ABDOMINAL AORTAGRAM N/A 05/28/2013   Procedure: ABDOMINAL Ronny FlurryAORTAGRAM;  Surgeon: Iran OuchMuhammad A Arida, MD;  Location: [facility] CATH LAB;  Service: Cardiovascular;  Laterality: N/A;  . CAROTID ANGIOGRAM Bilateral 10/27/2014   Procedure: CAROTID ANGIOGRAM;  Surgeon: Nada LibmanVance W Brabham, MD;  [facility];  Service: Cardiovascular;  Laterality: Bilateral;  . CAROTID ENDARTERECTOMY Left December 03, 2014   CEA  . ENDARTERECTOMY Left 12/03/2014   Procedure: ENDARTERECTOMY CAROTID WITH PATCH ANGIOPLASTY;  Surgeon: Nada Libman, MD;  Location: [facility];  Service: Vascular;  Laterality: Left;  . EYE SURGERY Right    cataract surgery with lens implant  . Neck cyst resection    . PERCUTANEOUS STENT INTERVENTION Right 05/28/2013   Procedure: PERCUTANEOUS STENT INTERVENTION;  Surgeon: Iran Ouch, MD;  Location: [facility] CATH LAB;  Service: Cardiovascular;  Laterality: Right;  [name] VASECTOMY      ROS:  As stated in the HPI and negative for all other systems.  PHYSICAL EXAM BP 98/60   Pulse 60   Ht 5\' 3"  (1.6 m)   Wt 152 lb (68.9 kg)    BMI 26.93 kg/m  GENERAL:  Well appearing HEENT: PERRL NECK:  No jugular venous distention, waveform within normal limits, carotid upstroke brisk and symmetric, right carotid bruits, no thyromegaly LUNGS:  Clear to auscultation bilaterally HEART:  PMI not displaced or sustained,S1 and S2 within normal limits, no S3, no S4, no clicks, no rubs, no murmurs ABD:  Flat, positive bowel sounds normal in frequency in pitch, positive midline soft bruits, no rebound, no guarding, no midline pulsatile mass, no hepatomegaly, no splenomegaly EXT:  2 plus pulses upper, diminished bilateral lower, left femoral bruit no edema, no cyanosis no clubbing NEURO:  Nonfocal  Lab Results  Component Value Date   CHOL 137 11/11/2015   TRIG 164 (H) 11/11/2015   HDL 29 (L) 11/11/2015   LDLCALC 75 11/11/2015   LDLDIRECT 74.9 09/02/2013    EKG:  Sinus rhythm, rate 59, axis within normal limits, minimal will criteria for left ventricular hypertrophy with nonspecific ST-T wave changes.05/24/2016  ASSESSMENT AND PLAN  Cardiomyopathy He has class I symptoms.  No change in therapy is indicated. I will check a BMET  Atrial fibrillation He has had no recurrent dysrhythmias since stopping ETOH.   Atrial fib was seen only during his acute illness.  No anticoagulation is indicated.   CAD This is nonobstructive.  He will continue to be managed with risk reduction.  HTN This is being managed in the context of treating his CHF  CAROTID STENOSIS He is status post CEA.  This is now occluded.  He is followed by Dr. Myra Gianotti  PVD He has had symptom relief.   We will continue with risk reduction.   DYSLIPIDEMIA His last LDL is listed above.  He will continue on meds as listed.

## 2016-05-24 ENCOUNTER — Ambulatory Visit (INDEPENDENT_AMBULATORY_CARE_PROVIDER_SITE_OTHER): Payer: Medicare Other | Admitting: Cardiology

## 2016-05-24 ENCOUNTER — Encounter: Payer: Self-pay | Admitting: Cardiology

## 2016-05-24 ENCOUNTER — Other Ambulatory Visit: Payer: Medicare Other

## 2016-05-24 VITALS — BP 98/60 | HR 60 | Ht 63.0 in | Wt 152.0 lb

## 2016-05-24 DIAGNOSIS — I2510 Atherosclerotic heart disease of native coronary artery without angina pectoris: Secondary | ICD-10-CM | POA: Diagnosis not present

## 2016-05-24 DIAGNOSIS — Z79899 Other long term (current) drug therapy: Secondary | ICD-10-CM

## 2016-05-24 DIAGNOSIS — I6523 Occlusion and stenosis of bilateral carotid arteries: Secondary | ICD-10-CM | POA: Diagnosis not present

## 2016-05-24 DIAGNOSIS — I480 Paroxysmal atrial fibrillation: Secondary | ICD-10-CM

## 2016-05-24 MED ORDER — SPIRONOLACTONE 50 MG PO TABS: 50.0000 mg | ORAL_TABLET | Freq: Every day | ORAL | 3 refills | Status: DC

## 2016-05-24 MED ORDER — ATORVASTATIN CALCIUM 40 MG PO TABS: 40.0000 mg | ORAL_TABLET | Freq: Every day | ORAL | 3 refills | Status: DC

## 2016-05-24 MED ORDER — LOSARTAN POTASSIUM 50 MG PO TABS: 50.0000 mg | ORAL_TABLET | Freq: Two times a day (BID) | ORAL | 3 refills | Status: DC

## 2016-05-24 MED ORDER — FUROSEMIDE 20 MG PO TABS: 20.0000 mg | ORAL_TABLET | Freq: Every day | ORAL | 3 refills | Status: DC

## 2016-05-24 MED ORDER — CARVEDILOL 25 MG PO TABS: 25.0000 mg | ORAL_TABLET | Freq: Two times a day (BID) | ORAL | 3 refills | Status: DC

## 2016-05-24 NOTE — Patient Instructions (Signed)
Medication Instructions:  The current medical regimen is effective;  continue present plan and medications.  Labwork: Please have blood work today at [facility]  (BMP)  Follow-Up: Follow up in 6 months with Dr. Hochrein.  You will receive a letter in the mail 2 months before you are due.  Please call us when you receive this letter to schedule your follow up appointment.  If you need a refill on your cardiac medications before your next appointment, please call your pharmacy.  Thank you for choosing [facility]!!     

## 2016-05-25 LAB — BASIC METABOLIC PANEL
BUN / CREAT RATIO: 25 — AB (ref 10–24)
BUN: 31 mg/dL — ABNORMAL HIGH (ref 8–27)
CO2: 24 mmol/L (ref 18–29)
CREATININE: 1.26 mg/dL (ref 0.76–1.27)
Calcium: 9.6 mg/dL (ref 8.6–10.2)
Chloride: 100 mmol/L (ref 96–106)
GFR calc Af Amer: 66 mL/min/{1.73_m2} (ref >59)
GFR calc non Af Amer: 57 mL/min/{1.73_m2} — ABNORMAL LOW (ref >59)
GLUCOSE: 110 mg/dL — AB (ref 65–99)
Potassium: 5 mmol/L (ref 3.5–5.2)
SODIUM: 138 mmol/L (ref 134–144)

## 2016-05-25 LAB — PLEASE NOTE

## 2016-07-10 ENCOUNTER — Ambulatory Visit (INDEPENDENT_AMBULATORY_CARE_PROVIDER_SITE_OTHER): Payer: Medicare Other | Admitting: Ophthalmology

## 2016-10-17 ENCOUNTER — Encounter: Payer: Self-pay | Admitting: Cardiology

## 2017-01-29 ENCOUNTER — Ambulatory Visit: Payer: Medicare Other | Admitting: Family

## 2017-01-29 ENCOUNTER — Encounter (HOSPITAL_COMMUNITY): Payer: Medicare Other

## 2019-10-15 ENCOUNTER — Other Ambulatory Visit: Payer: Self-pay

## 2019-10-16 ENCOUNTER — Encounter: Payer: Self-pay | Admitting: Family Medicine

## 2019-10-16 ENCOUNTER — Ambulatory Visit: Payer: Medicare HMO | Admitting: Family Medicine

## 2019-10-16 VITALS — BP 148/72 | HR 74 | Temp 98.9°F | Ht 63.0 in | Wt 148.6 lb

## 2019-10-16 DIAGNOSIS — I6523 Occlusion and stenosis of bilateral carotid arteries: Secondary | ICD-10-CM

## 2019-10-16 DIAGNOSIS — I509 Heart failure, unspecified: Secondary | ICD-10-CM

## 2019-10-16 DIAGNOSIS — I2510 Atherosclerotic heart disease of native coronary artery without angina pectoris: Secondary | ICD-10-CM | POA: Diagnosis not present

## 2019-10-16 DIAGNOSIS — E785 Hyperlipidemia, unspecified: Secondary | ICD-10-CM

## 2019-10-16 DIAGNOSIS — Z23 Encounter for immunization: Secondary | ICD-10-CM

## 2019-10-16 DIAGNOSIS — I739 Peripheral vascular disease, unspecified: Secondary | ICD-10-CM | POA: Diagnosis not present

## 2019-10-16 DIAGNOSIS — I429 Cardiomyopathy, unspecified: Secondary | ICD-10-CM

## 2019-10-16 DIAGNOSIS — I480 Paroxysmal atrial fibrillation: Secondary | ICD-10-CM | POA: Diagnosis not present

## 2019-10-16 DIAGNOSIS — I10 Essential (primary) hypertension: Secondary | ICD-10-CM | POA: Insufficient documentation

## 2019-10-16 HISTORY — DX: Essential (primary) hypertension: I10

## 2019-10-16 NOTE — Progress Notes (Signed)
New Patient Office Visit  Assessment & Plan:  1-5. Congestive heart failure with cardiomyopathy (HCC)/Coronary artery disease involving native coronary artery of native heart without angina pectoris/Peripheral arterial disease (HCC)/Paroxysmal atrial fibrillation (HCC)/Bilateral carotid artery stenosis - Ambulatory referral to Cardiology - CBC with Differential/Platelet  6. Hyperlipidemia, unspecified hyperlipidemia type - Lipid panel  7. Essential hypertension - Well controlled on current regimen.    Follow-up: Return in about 1 year (around 10/15/2020) for annual physical.   Ronnie Boston, MSN, APRN, FNP-C [facility]  Subjective:  Patient ID: Ronnie Harvey, male    DOB: 01/25/1946  Age: 74 y.o. MRN: [id number]  Patient Care Team: Ronnie Fudge, FNP as PCP - General (Family Medicine) Ronnie Rotunda, MD as Consulting Physician (Cardiology) Ronnie Pierini, FNP as Nurse Practitioner (Family Medicine) Ronnie Ouch, MD as Consulting Physician (Cardiology)  CC:  Chief Complaint  Patient presents with  . New Patient (Initial Visit)    Patient has no problems just needs referral to Ronnie Harvey    HPI [facility] presents to establish care. Patient is transferring care from [place].   Patient is in need of a referral to Dr. Antoine Harvey (cardiology) due to a history of CAD, carotid stenosis, CHF with cardiomyopathy, PAD, and A-Fib. Patient reports he saw him in the past before going to [place].   Patient has no complaints at this time.    Review of Systems  Constitutional: Negative for chills, fever, malaise/fatigue and weight loss.  HENT: Negative for congestion, ear discharge, ear pain, nosebleeds, sinus pain, sore throat and tinnitus.   Eyes: Negative for blurred vision, double vision, pain, discharge and redness.  Respiratory: Negative for cough, shortness of breath and wheezing.   Cardiovascular: Negative for chest pain,  palpitations and leg swelling.  Gastrointestinal: Negative for abdominal pain, constipation, diarrhea, heartburn, nausea and vomiting.  Genitourinary: Negative for dysuria, frequency and urgency.  Musculoskeletal: Negative for myalgias.  Skin: Negative for rash.  Neurological: Negative for dizziness, seizures, weakness and headaches.  Psychiatric/Behavioral: Negative for depression, substance abuse and suicidal ideas. The patient is not nervous/anxious.     Current Outpatient Medications:  .  aspirin EC 81 MG tablet, Take 1 tablet (81 mg total) by mouth daily., Disp: , Rfl:  .  atorvastatin (LIPITOR) 40 MG tablet, Take 1 tablet (40 mg total) by mouth daily., Disp: 90 tablet, Rfl: 3 .  carvedilol (COREG) 25 MG tablet, Take 1 tablet (25 mg total) by mouth 2 (two) times daily with a meal., Disp: 180 tablet, Rfl: 3 .  furosemide (LASIX) 20 MG tablet, Take 1 tablet (20 mg total) by mouth daily., Disp: 90 tablet, Rfl: 3 .  psyllium (METAMUCIL) 58.6 % powder, Take 1 packet by mouth 3 (three) times daily., Disp: , Rfl:  .  spironolactone (ALDACTONE) 50 MG tablet, Take 1 tablet (50 mg total) by mouth daily., Disp: 90 tablet, Rfl: 3  No Known Allergies  Past Medical History:  Diagnosis Date  . Arthritis   . CAD (coronary artery disease) nonobstructive   a. 10/2012 Cath: LM 50, LAD 25p, 33m, Diag 32m, LCX 30p, 40d, OM1 60-70, OM3 30, RCA 40-40m, AM 70p, PDA nl, EF 20%.  . Cardiomyopathy, dilated (HCC)    a. 09/2012 Echo: EF 15-20%, diff HK, gr2 DD, Triv AI, Mod MR, mild TR/PR, mod-sev dil LA.  . CHF (congestive heart failure) (HCC)   . Diverticulitis   . Essential hypertension 10/16/2019  . ETOH abuse    Quit  1998  . GERD (gastroesophageal reflux disease)    rarely (worse when he was drinking)  . Itchy skin   . Myocardial infarction (HCC)   . PAF (paroxysmal atrial fibrillation) (HCC)   . PVD (peripheral vascular disease) (HCC)    a. 05/2013 s/p PTA/self-expanding stenting of the REIA and LCIA.   [name] Retinal hole of right eye   . Shortness of breath dyspnea    with exertion  . Tobacco abuse    Quit Dec 2012    Past Surgical History:  Procedure Laterality Date  . ABDOMINAL AORTAGRAM N/A 05/28/2013   Procedure: ABDOMINAL Ronny Flurry;  Surgeon: Ronnie Ouch, MD;  Location: [facility] CATH LAB;  Service: Cardiovascular;  Laterality: N/A;  . CAROTID ANGIOGRAM Bilateral 10/27/2014   Procedure: CAROTID ANGIOGRAM;  Surgeon: Nada Libman, MD;  Location: [facility] CATH LAB;  Service: Cardiovascular;  Laterality: Bilateral;  . CAROTID ENDARTERECTOMY Left December 03, 2014   CEA  . ENDARTERECTOMY Left 12/03/2014   Procedure: ENDARTERECTOMY CAROTID WITH PATCH ANGIOPLASTY;  Surgeon: Nada Libman, MD;  Location: [facility] OR;  Service: Vascular;  Laterality: Left;  . EYE SURGERY Right    cataract surgery with lens implant  . Neck cyst resection    . PERCUTANEOUS STENT INTERVENTION Right 05/28/2013   Procedure: PERCUTANEOUS STENT INTERVENTION;  Surgeon: Ronnie Ouch, MD;  Location: [facility] CATH LAB;  Service: Cardiovascular;  Laterality: Right;  [name] VASECTOMY      Family History  Problem Relation Age of Onset  . Diabetes Mother   . Diabetes Father   . Diabetes Brother   . COPD Brother   . Breast cancer Sister 7  . Diabetes Brother     Social History   Socioeconomic History  . Marital status: Married    Spouse name: Not on file  . Number of children: Not on file  . Years of education: Not on file  . Highest education level: Not on file  Occupational History  . Not on file  Tobacco Use  . Smoking status: Former Smoker    Packs/day: 1.00    Years: 49.00    Pack years: 49.00    Types: Cigarettes    Start date: 08/30/2012    Quit date: 11/24/2012    Years since quitting: 6.8  . Smokeless tobacco: Former Neurosurgeon    Types: Snuff  Substance and Sexual Activity  . Alcohol use: No    Alcohol/week: 0.0 standard drinks    Comment: Former ETOH.  None since 1998 Prior to 1998 was a heavy drinker  . Drug  use: No  . Sexual activity: Not on file  Other Topics Concern  . Not on file  Social History Narrative  . Not on file   Social Determinants of Health   Financial Resource Strain:   . Difficulty of Paying Living Expenses: Not on file  Food Insecurity:   . Worried About Programme researcher, broadcasting/film/video in the Last Year: Not on file  . Ran Out of Food in the Last Year: Not on file  Transportation Needs:   . Lack of Transportation (Medical): Not on file  . Lack of Transportation (Non-Medical): Not on file  Physical Activity:   . Days of Exercise per Week: Not on file  . Minutes of Exercise per Session: Not on file  Stress:   . Feeling of Stress : Not on file  Social Connections:   . Frequency of Communication with Friends and Family: Not on file  . Frequency of Social  Gatherings with Friends and Family: Not on file  . Attends Religious Services: Not on file  . Active Member of Clubs or Organizations: Not on file  . Attends Archivist Meetings: Not on file  . Marital Status: Not on file  Intimate Partner Violence:   . Fear of Current or Ex-Partner: Not on file  . Emotionally Abused: Not on file  . Physically Abused: Not on file  . Sexually Abused: Not on file    Objective:   Today's Vitals: BP (!) 148/72 Comment: manual  Pulse 74   Temp 98.9 F (37.2 C) (Temporal)   Ht 5\' 3"  (1.6 m)   Wt 148 lb 9.6 oz (67.4 kg)   SpO2 93%   BMI 26.32 kg/m   Physical Exam Vitals reviewed.  Constitutional:      General: He is not in acute distress.    Appearance: Normal appearance. He is overweight. He is not ill-appearing, toxic-appearing or diaphoretic.  HENT:     Head: Normocephalic and atraumatic.     Right Ear: Tympanic membrane, ear canal and external ear normal. There is no impacted cerumen.     Left Ear: Tympanic membrane, ear canal and external ear normal. There is no impacted cerumen.     Nose: Nose normal. No congestion or rhinorrhea.     Mouth/Throat:     Mouth: Mucous  membranes are moist.     Pharynx: Oropharynx is clear. No oropharyngeal exudate or posterior oropharyngeal erythema.  Eyes:     General: No scleral icterus.       Right eye: No discharge.        Left eye: No discharge.     Conjunctiva/sclera: Conjunctivae normal.     Pupils: Pupils are equal, round, and reactive to light.  Cardiovascular:     Rate and Rhythm: Normal rate and regular rhythm.     Heart sounds: Normal heart sounds. No murmur. No friction rub. No gallop.   Pulmonary:     Effort: Pulmonary effort is normal. No respiratory distress.     Breath sounds: Normal breath sounds. No stridor. No wheezing, rhonchi or rales.  Abdominal:     General: Abdomen is flat. Bowel sounds are normal. There is no distension.     Palpations: Abdomen is soft. There is no mass.     Tenderness: There is no abdominal tenderness. There is no guarding or rebound.     Hernia: No hernia is present.  Musculoskeletal:        General: Normal range of motion.     Cervical back: Normal range of motion and neck supple. No rigidity. No muscular tenderness.     Right lower leg: No edema.     Left lower leg: No edema.  Lymphadenopathy:     Cervical: No cervical adenopathy.  Skin:    General: Skin is warm and dry.     Capillary Refill: Capillary refill takes less than 2 seconds.  Neurological:     General: No focal deficit present.     Mental Status: He is alert and oriented to person, place, and time. Mental status is at baseline.  Psychiatric:        Mood and Affect: Mood normal.        Behavior: Behavior normal.        Thought Content: Thought content normal.        Judgment: Judgment normal.

## 2019-10-17 ENCOUNTER — Encounter: Payer: Self-pay | Admitting: Family Medicine

## 2019-10-17 LAB — CBC WITH DIFFERENTIAL/PLATELET
Basophils Absolute: 0 10*3/uL (ref 0.0–0.2)
Basos: 1 %
EOS (ABSOLUTE): 0.3 10*3/uL (ref 0.0–0.4)
Eos: 3 %
Hematocrit: 45.1 % (ref 37.5–51.0)
Hemoglobin: 15.4 g/dL (ref 13.0–17.7)
Immature Grans (Abs): 0.1 10*3/uL (ref 0.0–0.1)
Immature Granulocytes: 1 %
Lymphocytes Absolute: 1.6 10*3/uL (ref 0.7–3.1)
Lymphs: 19 %
MCH: 31.3 pg (ref 26.6–33.0)
MCHC: 34.1 g/dL (ref 31.5–35.7)
MCV: 92 fL (ref 79–97)
Monocytes Absolute: 1.1 10*3/uL — ABNORMAL HIGH (ref 0.1–0.9)
Monocytes: 13 %
Neutrophils Absolute: 5.4 10*3/uL (ref 1.4–7.0)
Neutrophils: 63 %
Platelets: 212 10*3/uL (ref 150–450)
RBC: 4.92 x10E6/uL (ref 4.14–5.80)
RDW: 12.2 % (ref 11.6–15.4)
WBC: 8.5 10*3/uL (ref 3.4–10.8)

## 2019-10-17 LAB — CMP14+EGFR
ALT: 13 IU/L (ref 0–44)
AST: 24 IU/L (ref 0–40)
Albumin/Globulin Ratio: 1.3 (ref 1.2–2.2)
Albumin: 4.2 g/dL (ref 3.7–4.7)
Alkaline Phosphatase: 104 IU/L (ref 39–117)
BUN/Creatinine Ratio: 18 (ref 10–24)
BUN: 21 mg/dL (ref 8–27)
Bilirubin Total: 1.2 mg/dL (ref 0.0–1.2)
CO2: 24 mmol/L (ref 20–29)
Calcium: 9.2 mg/dL (ref 8.6–10.2)
Chloride: 97 mmol/L (ref 96–106)
Creatinine, Ser: 1.18 mg/dL (ref 0.76–1.27)
GFR calc Af Amer: 70 mL/min/{1.73_m2} (ref >59)
GFR calc non Af Amer: 60 mL/min/{1.73_m2} (ref >59)
Globulin, Total: 3.2 g/dL (ref 1.5–4.5)
Glucose: 92 mg/dL (ref 65–99)
Potassium: 4.3 mmol/L (ref 3.5–5.2)
Sodium: 135 mmol/L (ref 134–144)
Total Protein: 7.4 g/dL (ref 6.0–8.5)

## 2019-10-17 LAB — LIPID PANEL
Chol/HDL Ratio: 3.5 ratio (ref 0.0–5.0)
Cholesterol, Total: 105 mg/dL (ref 100–199)
HDL: 30 mg/dL — ABNORMAL LOW (ref >39)
LDL Chol Calc (NIH): 56 mg/dL (ref 0–99)
Triglycerides: 98 mg/dL (ref 0–149)
VLDL Cholesterol Cal: 19 mg/dL (ref 5–40)

## 2019-11-25 DIAGNOSIS — Z7189 Other specified counseling: Secondary | ICD-10-CM | POA: Insufficient documentation

## 2019-11-25 NOTE — Progress Notes (Signed)
Cardiology Office Note   Date:  11/26/2019   ID:  [name], DOB [birth year], MRN [id number]  PCP:  Gwenlyn Fudge, FNP  Cardiologist:   No primary care provider on file. Referring:  Gwenlyn Fudge, FNP  Chief Complaint  Patient presents with  . Coronary Artery Disease      History of Present Illness: Ronnie Harvey is a 74 y.o. male who presents for followup of cardiomyopathy. He was seen in consultation by [place] at the time of diverticulitis and bowel microperforation a few years ago. He had atrial fibrillation as part of this hospitalization. He had respiratory failure and required intubation. His ejection fraction was 20%.  He was not anticoagulated at that time because of his ongoing acute issues. Cardiac catheterization demonstrated left main 50% stenosis, LAD 40% stenosis, circumflex 30% followed by distal 40% stenosis, small obtuse marginal 60-70% stenosis and right coronary artery 40-50% stenosis. The EF was 20%.  He has also had PTA and stenting of his right iliac and left common iliac by Dr. Kirke Corin.  I did repeat an echo in 2015 and the EF was about 30% and was up to 40 - 45% in March 2017.  He is status post CEA.  However, follow up angiography demonstrates this to now be occluded on the left.  He has only mild disease on the right with some subclavian disease.    He has been over 3 years since I saw him.  He had been up at [place].  His wife died 3 years ago.  I do see that in 2018 he had to have stenting of his circumflex and right coronary artery.  He will have these notes but he brings the cards of with the stent details.  He indicates that he had an abnormal stress test.  I am not sure what symptoms he was having at that time.  He says he has felt okay since then.  He has not had any new shortness of breath, PND or orthopnea.  Is not had any palpitations, presyncope or syncope.  He denies any chest pressure, neck or arm discomfort.  He does yard work around his house  and all the housework now that he is living alone.   Past Medical History:  Diagnosis Date  . Arthritis   . CAD (coronary artery disease) nonobstructive   a. 10/2012 Cath: LM 50, LAD 25p, 26m, Diag 52m, LCX 30p, 40d, OM1 60-70, OM3 30, RCA 40-87m, AM 70p, PDA nl, EF 20%. c.  Stents to RCA circumvlex  06/2017 in [place].    . Cardiomyopathy, dilated (HCC)    a. 09/2012 Echo: EF 15-20%, diff HK, gr2 DD, Triv AI, Mod MR, mild TR/PR, mod-sev dil LA.  . CHF (congestive heart failure) (HCC)   . Diverticulitis   . Essential hypertension 10/16/2019  . ETOH abuse    Quit 1998  . GERD (gastroesophageal reflux disease)    rarely (worse when he was drinking)  . Itchy skin   . Myocardial infarction (HCC)   . PAF (paroxysmal atrial fibrillation) (HCC)   . PVD (peripheral vascular disease) (HCC)    a. 05/2013 s/p PTA/self-expanding stenting of the REIA and LCIA.  [name] Retinal hole of right eye   . Shortness of breath dyspnea    with exertion  . Tobacco abuse    Quit Dec 2012    Past Surgical History:  Procedure Laterality Date  . ABDOMINAL AORTAGRAM N/A 05/28/2013   Procedure: ABDOMINAL AORTAGRAM;  Surgeon: Iran Ouch, MD;  Location: [facility] CATH LAB;  Service: Cardiovascular;  Laterality: N/A;  . CAROTID ANGIOGRAM Bilateral 10/27/2014   Procedure: CAROTID ANGIOGRAM;  Surgeon: Nada Libman, MD;  [facility] CATH LAB;  Service: Cardiovascular;  Laterality: Bilateral;  . CAROTID ENDARTERECTOMY Left December 03, 2014   CEA  . ENDARTERECTOMY Left 12/03/2014   Procedure: ENDARTERECTOMY CAROTID WITH PATCH ANGIOPLASTY;  Surgeon: Nada Libman, MD;  Location: [facility] OR;  Service: Vascular;  Laterality: Left;  . EYE SURGERY Right    cataract surgery with lens implant  . Neck cyst resection    . PERCUTANEOUS STENT INTERVENTION Right 05/28/2013   Procedure: PERCUTANEOUS STENT INTERVENTION;  Surgeon: Iran Ouch, MD;  Location: [facility] CATH LAB;  Service: Cardiovascular;  Laterality: Right;  [name] VASECTOMY        Current Outpatient Medications  Medication Sig Dispense Refill  . aspirin EC 81 MG tablet Take 1 tablet (81 mg total) by mouth daily.    [name] atorvastatin (LIPITOR) 40 MG tablet Take 1 tablet (40 mg total) by mouth daily. 90 tablet 3  . carvedilol (COREG) 12.5 MG tablet Take 1 tablet (12.5 mg total) by mouth 2 (two) times daily. 180 tablet 3  . furosemide (LASIX) 20 MG tablet Take 20 mg by mouth. Take one tablet by mouth Mon., Wed., and Friday    . nitroGLYCERIN (NITROSTAT) 0.3 MG SL tablet Place 0.3 mg under the tongue every 5 (five) minutes as needed for chest pain.    [name] psyllium (METAMUCIL) 58.6 % powder Take 1 packet by mouth daily.     [name] spironolactone (ALDACTONE) 25 MG tablet 25 mg. Take one by mouth tablet on Mon., Wed., and Friday     No current facility-administered medications for this visit.    Allergies:   Patient has no known allergies.    Social History:  The patient  reports that he quit smoking about 7 years ago. His smoking use included cigarettes. He started smoking about 7 years ago. He has a 49.00 pack-year smoking history. He has quit using smokeless tobacco.  His smokeless tobacco use included snuff. He reports that he does not drink alcohol or use drugs.   Family History:  The patient's family history includes Breast cancer (age of onset: 7) in his sister; COPD in his brother; Diabetes in his brother, brother, father, and mother.    ROS:  Please see the history of present illness.   Otherwise, review of systems are positive for none.   All other systems are reviewed and negative.    PHYSICAL EXAM: VS:  BP (!) 160/80   Pulse 60   Ht 5\' 2"  (1.575 m)   Wt 144 lb (65.3 kg)   BMI 26.34 kg/m  , BMI Body mass index is 26.34 kg/m. GENERAL:  Well appearing HEENT:  Pupils equal round and reactive, fundi not visualized, oral mucosa unremarkable NECK:  No jugular venous distention, waveform within normal limits, carotid upstroke brisk and symmetric, no bruits, no  thyromegaly LYMPHATICS:  No cervical, inguinal adenopathy LUNGS:  Clear to auscultation bilaterally BACK:  No CVA tenderness CHEST:  Unremarkable HEART:  PMI not displaced or sustained,S1 and S2 within normal limits, no S3, no S4, no clicks, no rubs, no murmurs ABD:  Flat, positive bowel sounds normal in frequency in pitch, no bruits, no rebound, no guarding, no midline pulsatile mass, no hepatomegaly, no splenomegaly EXT:  2 plus pulses throughout, no edema, no cyanosis no clubbing SKIN:  No  rashes no nodules NEURO:  Cranial nerves II through XII grossly intact, motor grossly intact throughout PSYCH:  Cognitively intact, oriented to person place and time    EKG:  EKG is ordered today. The ekg ordered today demonstrates sinus rhythm, rate 60, axis within normal limits, intervals within normal limits, no acute ST-T wave changes   Recent Labs: 10/16/2019: ALT 13; BUN 21; Creatinine, Ser 1.18; Hemoglobin 15.4; Platelets 212; Potassium 4.3; Sodium 135    Lipid Panel    Component Value Date/Time   CHOL 105 10/16/2019 1354   TRIG 98 10/16/2019 1354   HDL 30 (L) 10/16/2019 1354   CHOLHDL 3.5 10/16/2019 1354   CHOLHDL 4 09/02/2013 0853   VLDL 41.2 (H) 09/02/2013 0853   LDLCALC 56 10/16/2019 1354   LDLDIRECT 74.9 09/02/2013 0853      Wt Readings from Last 3 Encounters:  11/26/19 144 lb (65.3 kg)  10/16/19 148 lb 9.6 oz (67.4 kg)  05/24/16 152 lb (68.9 kg)      Other studies Reviewed: Additional studies/ records that were reviewed today include: Labs. Review of the above records demonstrates:  Please see elsewhere in the note.     ASSESSMENT AND PLAN:  Cardiomyopathy His ejection fraction he said when it was checked at [place] was still around 45%.  He has not really tolerated a a lot of med titration and that he has had some low blood pressures.  For now we will continue him on what he is on but I will be watching his blood pressures as well.   Atrial fibrillation He  stopped drinking in 1998.  I have documented that he had atrial fibrillation in the distant past but none since he stopped alcohol.  I do not see any recent evidence of this.  No change in therapy.   CAD This is as above.  No change in therapy.  Needs continued risk reduction.   HTN Actually had to reduce his ACE inhibitor in the past.  Is not taking it now.  Has had some low blood pressures.  However, now it seems high.  He is getting keep a blood pressure diary and I might start a low-dose 20 mg of lisinopril.   CAROTID STENOSIS He is status post CEA.  This is now occluded.    We will continue with risk reduction.   Dyslipidemia His last LDL is LDL was very low and his total cholesterol 105.  He will remain on the meds as listed.    Current medicines are reviewed at length with the patient today.  The patient does not have concerns regarding medicines.  The following changes have been made:  no change  Labs/ tests ordered today include: None  Orders Placed This Encounter  Procedures  . EKG 12-Lead     Disposition:   FU with me in 4 months.     Signed, Minus Breeding, MD  11/26/2019 2:27 PM    [facility]

## 2019-11-26 ENCOUNTER — Encounter (INDEPENDENT_AMBULATORY_CARE_PROVIDER_SITE_OTHER): Payer: Self-pay

## 2019-11-26 ENCOUNTER — Ambulatory Visit: Payer: Self-pay | Admitting: Cardiology

## 2019-11-26 ENCOUNTER — Encounter: Payer: Self-pay | Admitting: Cardiology

## 2019-11-26 ENCOUNTER — Ambulatory Visit: Payer: Medicare HMO | Admitting: Cardiology

## 2019-11-26 ENCOUNTER — Other Ambulatory Visit: Payer: Self-pay

## 2019-11-26 VITALS — BP 160/80 | HR 60 | Ht 62.0 in | Wt 144.0 lb

## 2019-11-26 DIAGNOSIS — I480 Paroxysmal atrial fibrillation: Secondary | ICD-10-CM

## 2019-11-26 DIAGNOSIS — I10 Essential (primary) hypertension: Secondary | ICD-10-CM | POA: Diagnosis not present

## 2019-11-26 DIAGNOSIS — I6529 Occlusion and stenosis of unspecified carotid artery: Secondary | ICD-10-CM

## 2019-11-26 DIAGNOSIS — I509 Heart failure, unspecified: Secondary | ICD-10-CM

## 2019-11-26 DIAGNOSIS — I429 Cardiomyopathy, unspecified: Secondary | ICD-10-CM

## 2019-11-26 DIAGNOSIS — I2510 Atherosclerotic heart disease of native coronary artery without angina pectoris: Secondary | ICD-10-CM | POA: Diagnosis not present

## 2019-11-26 MED ORDER — CARVEDILOL 12.5 MG PO TABS: 12.5000 mg | ORAL_TABLET | Freq: Two times a day (BID) | ORAL | 3 refills | Status: DC

## 2019-11-26 NOTE — Patient Instructions (Signed)
Medication Instructions:  The current medical regimen is effective;  continue present plan and medications.  *If you need a refill on your cardiac medications before your next appointment, please call your pharmacy*  Please keep a blood pressure diary for a few weeks and return it to the [place] office.  Follow-Up: At [facility], you and your health needs are our priority.  As part of our continuing mission to provide you with exceptional heart care, we have created designated Provider Care Teams.  These Care Teams include your primary Cardiologist (physician) and Advanced Practice Providers (APPs -  Physician Assistants and Nurse Practitioners) who all work together to provide you with the care you need, when you need it.  We recommend signing up for the patient portal called "MyChart".  Sign up information is provided on this After Visit Summary.  MyChart is used to connect with patients for Virtual Visits (Telemedicine).  Patients are able to view lab/test results, encounter notes, upcoming appointments, etc.  Non-urgent messages can be sent to your provider as well.   To learn more about what you can do with MyChart, go to [email or web address].    Your next appointment:   4 month(s)  The format for your next appointment:   In Person  Provider:   [name], MD   Thank you for choosing [facility]!!

## 2019-12-12 ENCOUNTER — Encounter: Payer: Self-pay | Admitting: *Deleted

## 2019-12-24 ENCOUNTER — Encounter: Payer: Self-pay | Admitting: *Deleted

## 2020-03-29 DIAGNOSIS — I5022 Chronic systolic (congestive) heart failure: Secondary | ICD-10-CM | POA: Insufficient documentation

## 2020-03-29 NOTE — Progress Notes (Signed)
Cardiology Office Note   Date:  03/31/2020   ID:  Ronnie Harvey, DOB November 02, 1945, MRN [id number]  PCP:  Ronnie Fudge, FNP  Cardiologist:   No primary care provider on file. Referring:  Ronnie Fudge, FNP  Chief Complaint  Patient presents with  . Coronary Artery Disease      History of Present Illness: Ronnie Harvey is a 74 y.o. male who presents for followup of cardiomyopathy. He was seen in consultation by Ronnie Harvey at the time of diverticulitis and bowel microperforation a few years ago. He had atrial fibrillation as part of this hospitalization. He had respiratory failure and required intubation. His ejection fraction was 20%.  He was not anticoagulated at that time because of his ongoing acute issues. Cardiac catheterization demonstrated left main 50% stenosis, LAD 40% stenosis, circumflex 30% followed by distal 40% stenosis, small obtuse marginal 60-70% stenosis and right coronary artery 40-50% stenosis. The EF was 20%.  He has also had PTA and stenting of his right iliac and left common iliac by Ronnie Harvey.  I did repeat an echo in 2015 and the EF was about 30% and was up to 40 - 45% in March 2017.  He is status post CEA.  However, follow up angiography demonstrates this to now be occluded on the left.  He has only mild disease on the right with some subclavian disease.    At the last visit earlier this year I asked him to record his blood pressure diary because he was running high.  His blood pressure is running better and typically in the 140 range or so.  He has not tolerated a lot of med titration in the past.  He feels very well.  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  He denies any chest pressure, neck or arm discomfort.  He has had no new shortness of breath, PND or orthopnea.  He has had no weight gain or edema.   Past Medical History:  Diagnosis Date  . Arthritis   . CAD (coronary artery disease) nonobstructive   a. 10/2012 Cath: LM 50, LAD 25p, 92m, Diag  53m, LCX 30p, 40d, OM1 60-70, OM3 30, RCA 40-6m, AM 70p, PDA nl, EF 20%. c.  Stents to RCA circumvlex  06/2017 in [place].    . Cardiomyopathy, dilated (HCC)    a. 09/2012 Echo: EF 15-20%, diff HK, gr2 DD, Triv AI, Mod MR, mild TR/PR, mod-sev dil LA.  . CHF (congestive heart failure) (HCC)   . Diverticulitis   . Essential hypertension 10/16/2019  . ETOH abuse    Quit 1998  . GERD (gastroesophageal reflux disease)    rarely (worse when he was drinking)  . Itchy skin   . Myocardial infarction (HCC)   . PAF (paroxysmal atrial fibrillation) (HCC)   . PVD (peripheral vascular disease) (HCC)    a. 05/2013 s/p PTA/self-expanding stenting of the REIA and LCIA.  Ronnie Harvey Retinal hole of right eye   . Shortness of breath dyspnea    with exertion  . Tobacco abuse    Quit Dec 2012    Past Surgical History:  Procedure Laterality Date  . ABDOMINAL AORTAGRAM N/A 05/28/2013   Procedure: ABDOMINAL Ronny Flurry;  Surgeon: Ronnie Ouch, MD;  Location: [facility] CATH LAB;  Service: Cardiovascular;  Laterality: N/A;  . CAROTID ANGIOGRAM Bilateral 10/27/2014   Procedure: CAROTID ANGIOGRAM;  Surgeon: Ronnie Libman, MD;  Location: [facility] CATH LAB;  Service: Cardiovascular;  Laterality: Bilateral;  .  CAROTID ENDARTERECTOMY Left December 03, 2014   CEA  . ENDARTERECTOMY Left 12/03/2014   Procedure: ENDARTERECTOMY CAROTID WITH PATCH ANGIOPLASTY;  Surgeon: Ronnie Libman, MD;  Location: [facility];  Service: Vascular;  Laterality: Left;  . EYE SURGERY Right    cataract surgery with lens implant  . Neck cyst resection    . PERCUTANEOUS STENT INTERVENTION Right 05/28/2013   Procedure: PERCUTANEOUS STENT INTERVENTION;  Surgeon: Ronnie Ouch, MD;  Location: [facility] CATH LAB;  Service: Cardiovascular;  Laterality: Right;  Ronnie Harvey VASECTOMY       Current Outpatient Medications  Medication Sig Dispense Refill  . aspirin EC 81 MG tablet Take 1 tablet (81 mg total) by mouth daily.    Ronnie Harvey atorvastatin (LIPITOR) 40 MG tablet Take 1 tablet (40 mg  total) by mouth daily. 90 tablet 3  . carvedilol (COREG) 12.5 MG tablet Take 1 tablet (12.5 mg total) by mouth 2 (two) times daily. 180 tablet 3  . furosemide (LASIX) 20 MG tablet Take 20 mg by mouth. Take one tablet by mouth Mon., Wed., and Friday    . nitroGLYCERIN (NITROSTAT) 0.3 MG SL tablet Place 0.3 mg under the tongue every 5 (five) minutes as needed for chest pain.    Ronnie Harvey psyllium (METAMUCIL) 58.6 % powder Take 1 packet by mouth as needed.     Ronnie Harvey spironolactone (ALDACTONE) 25 MG tablet 25 mg. Take one by mouth tablet on Mon., Wed., and Friday     No current facility-administered medications for this visit.    Allergies:   Patient has no known allergies.   ROS:  Please see the history of present illness.   Otherwise, review of systems are positive for none.   All other systems are reviewed and negative.    PHYSICAL EXAM: VS:  BP 120/88   Pulse 61   Ht 5\' 2"  (1.575 m)   Wt 143 lb (64.9 kg)   BMI 26.16 kg/m  , BMI Body mass index is 26.16 kg/m. GENERAL:  Well appearing NECK:  No jugular venous distention, waveform within normal limits, carotid upstroke brisk and symmetric, no bruits, no thyromegaly LUNGS:  Clear to auscultation bilaterally CHEST:  Unremarkable HEART:  PMI not displaced or sustained,S1 and S2 within normal limits, no S3, no S4, no clicks, no rubs, no murmurs ABD:  Flat, positive bowel sounds normal in frequency in pitch, no bruits, no rebound, no guarding, no midline pulsatile mass, no hepatomegaly, no splenomegaly EXT:  2 plus pulses throughout, no edema, no cyanosis no clubbing   EKG:  EKG is  ordered today. The ekg ordered today demonstrates sinus rhythm, rate 60, axis within normal limits, intervals within normal limits, no acute ST-T wave changes   Recent Labs: 10/16/2019: ALT 13; BUN 21; Creatinine, Ser 1.18; Hemoglobin 15.4; Platelets 212; Potassium 4.3; Sodium 135    Lipid Panel    Component Value Date/Time   CHOL 105 10/16/2019 1354   TRIG 98  10/16/2019 1354   HDL 30 (L) 10/16/2019 1354   CHOLHDL 3.5 10/16/2019 1354   CHOLHDL 4 09/02/2013 0853   VLDL 41.2 (H) 09/02/2013 0853   LDLCALC 56 10/16/2019 1354   LDLDIRECT 74.9 09/02/2013 0853      Wt Readings from Last 3 Encounters:  03/31/20 143 lb (64.9 kg)  11/26/19 144 lb (65.3 kg)  10/16/19 148 lb 9.6 oz (67.4 kg)      Other studies Reviewed: Additional studies/ records that were reviewed today include: None. Review of the above records demonstrates:  NA  ASSESSMENT AND PLAN:  Cardiomyopathy His ejection fraction he said when it was checked at [place] was still around 45%.  He has not really tolerated a a lot of med titration.  No change in therapy.  Atrial fibrillation I have documented that he had atrial fibrillation in the distant past but none since he stopped alcohol.    CAD The patient has no new sypmtoms.  No further cardiovascular testing is indicated.  We will continue with aggressive risk reduction and meds as listed.  HTN The blood pressure is actually well controlled.  He really has not tolerated med titration.  I am not going to further titrate.    Carotid stenosis He is status post CEA.  This is now occluded.    I will follow up Dopplers in the future.  Dyslipidemia:  His last LDL is LDL was very low and his total cholesterol 105.     Current medicines are reviewed at length with the patient today.  The patient does not have concerns regarding medicines.  The following changes have been made:  no change  Labs/ tests ordered today include: None  Orders Placed This Encounter  Procedures  . EKG 12-Lead     Disposition:   FU with me in 12 months.     Signed, [name], MD  03/31/2020 1:20 PM     [facility]

## 2020-03-31 ENCOUNTER — Other Ambulatory Visit: Payer: Self-pay

## 2020-03-31 ENCOUNTER — Encounter: Payer: Self-pay | Admitting: Cardiology

## 2020-03-31 ENCOUNTER — Ambulatory Visit (INDEPENDENT_AMBULATORY_CARE_PROVIDER_SITE_OTHER): Payer: Medicare HMO | Admitting: Cardiology

## 2020-03-31 VITALS — BP 120/88 | HR 61 | Ht 62.0 in | Wt 143.0 lb

## 2020-03-31 DIAGNOSIS — I2510 Atherosclerotic heart disease of native coronary artery without angina pectoris: Secondary | ICD-10-CM | POA: Diagnosis not present

## 2020-03-31 DIAGNOSIS — I10 Essential (primary) hypertension: Secondary | ICD-10-CM

## 2020-03-31 DIAGNOSIS — I5022 Chronic systolic (congestive) heart failure: Secondary | ICD-10-CM

## 2020-03-31 NOTE — Patient Instructions (Signed)
Medication Instructions:  The current medical regimen is effective;  continue present plan and medications.  *If you need a refill on your cardiac medications before your next appointment, please call your pharmacy*  Follow-Up: At [facility], you and your health needs are our priority.  As part of our continuing mission to provide you with exceptional heart care, we have created designated Provider Care Teams.  These Care Teams include your primary Cardiologist (physician) and Advanced Practice Providers (APPs -  Physician Assistants and Nurse Practitioners) who all work together to provide you with the care you need, when you need it.  We recommend signing up for the patient portal called "MyChart".  Sign up information is provided on this After Visit Summary.  MyChart is used to connect with patients for Virtual Visits (Telemedicine).  Patients are able to view lab/test results, encounter notes, upcoming appointments, etc.  Non-urgent messages can be sent to your provider as well.   To learn more about what you can do with MyChart, go to [email or web address].    Your next appointment:   12 month(s)  The format for your next appointment:   In Person  Provider:   James Hochrein, MD   Thank you for choosing [facility]!!     

## 2020-04-14 ENCOUNTER — Other Ambulatory Visit: Payer: Self-pay

## 2020-04-14 ENCOUNTER — Ambulatory Visit: Payer: Medicare HMO | Admitting: Family Medicine

## 2020-04-14 ENCOUNTER — Encounter: Payer: Self-pay | Admitting: Family Medicine

## 2020-04-14 VITALS — BP 140/79 | HR 68 | Temp 97.3°F | Ht 62.0 in | Wt 143.4 lb

## 2020-04-14 DIAGNOSIS — I10 Essential (primary) hypertension: Secondary | ICD-10-CM

## 2020-04-14 DIAGNOSIS — I2510 Atherosclerotic heart disease of native coronary artery without angina pectoris: Secondary | ICD-10-CM

## 2020-04-14 DIAGNOSIS — I739 Peripheral vascular disease, unspecified: Secondary | ICD-10-CM | POA: Diagnosis not present

## 2020-04-14 DIAGNOSIS — I5022 Chronic systolic (congestive) heart failure: Secondary | ICD-10-CM

## 2020-04-14 DIAGNOSIS — Z1159 Encounter for screening for other viral diseases: Secondary | ICD-10-CM

## 2020-04-14 DIAGNOSIS — E782 Mixed hyperlipidemia: Secondary | ICD-10-CM

## 2020-04-14 NOTE — Progress Notes (Signed)
Assessment & Plan:  1. Peripheral arterial disease ([place]) - On statin therapy.  2-5. Essential hypertension/Chronic systolic HF (heart failure) (HCC)/Coronary artery disease involving native coronary artery of native heart without angina pectoris/Mixed hyperlipidemia - Well controlled on current regimen. Managed by cardiology.  - CMP14+EGFR - Lipid panel  6. Encounter for hepatitis C screening test for low risk patient - Hepatitis C antibody   Return in about 1 year (around 04/14/2021) for annual physical.  [name], MSN, APRN, FNP-C [name]  Subjective:    Patient ID: Ronnie Harvey, male    DOB: 10-26-1945, 74 y.o.   MRN: [id number]  Patient Care Team: Loman Brooklyn, FNP as PCP - General (Family Medicine) Minus Breeding, MD as Consulting Physician (Cardiology) Chevis Pretty, FNP as Nurse Practitioner (Family Medicine) [name], MD as Consulting Physician (Cardiology)   Chief Complaint:  Chief Complaint  Patient presents with  . Hypertension    6 month follow up   . Hyperlipidemia    HPI: Ronnie Harvey is a 74 y.o. male presenting on 04/14/2020 for Hypertension (6 month follow up ) and Hyperlipidemia  Since our last visit patient did get reestablished with cardiology, Dr. Percival Spanish.  New complaints: None  Social history:  Relevant past medical, surgical, family and social history reviewed and updated as indicated. Interim medical history since our last visit reviewed.  Allergies and medications reviewed and updated.  DATA REVIEWED: CHART IN EPIC  ROS: Negative unless specifically indicated above in HPI.    Current Outpatient Medications:  .  aspirin EC 81 MG tablet, Take 1 tablet (81 mg total) by mouth daily., Disp: , Rfl:  .  atorvastatin (LIPITOR) 40 MG tablet, Take 1 tablet (40 mg total) by mouth daily., Disp: 90 tablet, Rfl: 3 .  carvedilol (COREG) 12.5 MG tablet, Take 1 tablet (12.5 mg total) by  mouth 2 (two) times daily., Disp: 180 tablet, Rfl: 3 .  furosemide (LASIX) 20 MG tablet, Take 20 mg by mouth. Take one tablet by mouth Mon., Wed., and Friday, Disp: , Rfl:  .  nitroGLYCERIN (NITROSTAT) 0.3 MG SL tablet, Place 0.3 mg under the tongue every 5 (five) minutes as needed for chest pain., Disp: , Rfl:  .  psyllium (METAMUCIL) 58.6 % powder, Take 1 packet by mouth as needed. , Disp: , Rfl:  .  spironolactone (ALDACTONE) 25 MG tablet, 25 mg. Take one by mouth tablet on Mon., Wed., and Friday, Disp: , Rfl:    No Known Allergies Past Medical History:  Diagnosis Date  . Arthritis   . CAD (coronary artery disease) nonobstructive   a. 10/2012 Cath: LM 50, LAD 25p, 42m Diag 279mLCX 30p, 40d, OM1 60-70, OM3 30, RCA 40-502mM 70p, PDA nl, EF 20%. c.  Stents to RCA circumvlex  06/2017 [place]  . Cardiomyopathy, [place]  a. 09/2012 Echo: EF 15-20%, diff HK, gr2 DD, Triv AI, Mod MR, mild TR/PR, mod-sev dil LA.  . CHF (congestive heart failure) ([place] . Diverticulitis   . Essential hypertension 10/16/2019  . ETOH abuse    Quit 1998  . GERD (gastroesophageal reflux disease)    rarely (worse when he was drinking)  . Itchy skin   . Myocardial infarction ([place] . PAF (paroxysmal [place] fibrillation) ([place] . PVD (peripheral vascular disease) ([place]  a. 05/2013 s/p PTA/self-expanding stenting of the REIA and LCIA.  . RMarland Kitchentinal hole of  right eye   . Shortness of breath dyspnea    with exertion  . Tobacco abuse    Quit Dec 2012    Past Surgical History:  Procedure Laterality Date  . ABDOMINAL AORTAGRAM N/A 05/28/2013   Procedure: ABDOMINAL AORTAGRAM;  Surgeon: Muhammad A Arida, MD;  Location: [facility] CATH LAB;  Service: Cardiovascular;  Laterality: N/A;  . CAROTID ANGIOGRAM Bilateral 10/27/2014   Procedure: CAROTID ANGIOGRAM;  Surgeon: Vance W Brabham, MD;  Location: [facility] CATH LAB;  Service: Cardiovascular;  Laterality: Bilateral;  . CAROTID ENDARTERECTOMY Left December 03, 2014   CEA   . ENDARTERECTOMY Left 12/03/2014   Procedure: ENDARTERECTOMY CAROTID WITH PATCH ANGIOPLASTY;  Surgeon: Vance W Brabham, MD;  Location: [facility] OR;  Service: Vascular;  Laterality: Left;  . EYE SURGERY Right    cataract surgery with lens implant  . Neck cyst resection    . PERCUTANEOUS STENT INTERVENTION Right 05/28/2013   Procedure: PERCUTANEOUS STENT INTERVENTION;  Surgeon: Muhammad A Arida, MD;  Location: [facility] CATH LAB;  Service: Cardiovascular;  Laterality: Right;  . VASECTOMY      Social History   Socioeconomic History  . Marital status: Married    Spouse name: Not on file  . Number of children: Not on file  . Years of education: Not on file  . Highest education level: Not on file  Occupational History  . Not on file  Tobacco Use  . Smoking status: Former Smoker    Packs/day: 1.00    Years: 49.00    Pack years: 49.00    Types: Cigarettes    Start date: 08/30/2012    Quit date: 11/24/2012    Years since quitting: 7.3  . Smokeless tobacco: Former User    Types: Snuff  Substance and Sexual Activity  . Alcohol use: No    Alcohol/week: 0.0 standard drinks    Comment: Former ETOH.  None since 1998 Prior to 1998 was a heavy drinker  . Drug use: No  . Sexual activity: Not on file  Other Topics Concern  . Not on file  Social History Narrative  . Not on file   Social Determinants of Health   Financial Resource Strain:   . Difficulty of Paying Living Expenses:   Food Insecurity:   . Worried About Running Out of Food in the Last Year:   . Ran Out of Food in the Last Year:   Transportation Needs:   . Lack of Transportation (Medical):   . Lack of Transportation (Non-Medical):   Physical Activity:   . Days of Exercise per Week:   . Minutes of Exercise per Session:   Stress:   . Feeling of Stress :   Social Connections:   . Frequency of Communication with Friends and Family:   . Frequency of Social Gatherings with Friends and Family:   . Attends Religious Services:   . Active  Member of Clubs or Organizations:   . Attends Club or Organization Meetings:   . Marital Status:   Intimate Partner Violence:   . Fear of Current or Ex-Partner:   . Emotionally Abused:   . Physically Abused:   . Sexually Abused:         Objective:    BP (!) 140/79   Pulse 68   Temp (!) 97.3 F (36.3 C) (Temporal)   Ht 5' 2" (1.575 m)   Wt 143 lb 6.4 oz (65 kg)   SpO2 95%   BMI 26.23 kg/m   Wt Readings from Last   3 Encounters:  04/14/20 143 lb 6.4 oz (65 kg)  03/31/20 143 lb (64.9 kg)  11/26/19 144 lb (65.3 kg)    Physical Exam Vitals reviewed.  Constitutional:      General: He is not in acute distress.    Appearance: Normal appearance. He is overweight. He is not ill-appearing, toxic-appearing or diaphoretic.  HENT:     Head: Normocephalic and atraumatic.  Eyes:     General: No scleral icterus.       Right eye: No discharge.        Left eye: No discharge.     Conjunctiva/sclera: Conjunctivae normal.  Cardiovascular:     Rate and Rhythm: Normal rate and regular rhythm.     Heart sounds: Normal heart sounds. No murmur heard.  No friction rub. No gallop.   Pulmonary:     Effort: Pulmonary effort is normal. No respiratory distress.     Breath sounds: Normal breath sounds. No stridor. No wheezing, rhonchi or rales.  Musculoskeletal:        General: Normal range of motion.     Cervical back: Normal range of motion.  Skin:    General: Skin is warm and dry.  Neurological:     Mental Status: He is alert and oriented to person, place, and time. Mental status is at baseline.  Psychiatric:        Mood and Affect: Mood normal.        Behavior: Behavior normal.        Thought Content: Thought content normal.        Judgment: Judgment normal.     Lab Results  Component Value Date   TSH 1.998 09/04/2012   Lab Results  Component Value Date   WBC 8.5 10/16/2019   HGB 15.4 10/16/2019   HCT 45.1 10/16/2019   MCV 92 10/16/2019   PLT 212 10/16/2019   Lab Results    Component Value Date   NA 135 10/16/2019   K 4.3 10/16/2019   CO2 24 10/16/2019   GLUCOSE 92 10/16/2019   BUN 21 10/16/2019   CREATININE 1.18 10/16/2019   BILITOT 1.2 10/16/2019   ALKPHOS 104 10/16/2019   AST 24 10/16/2019   ALT 13 10/16/2019   PROT 7.4 10/16/2019   ALBUMIN 4.2 10/16/2019   CALCIUM 9.2 10/16/2019   ANIONGAP 7 12/04/2014   GFR 53.38 (L) 02/17/2014   Lab Results  Component Value Date   CHOL 105 10/16/2019   Lab Results  Component Value Date   HDL 30 (L) 10/16/2019   Lab Results  Component Value Date   LDLCALC 56 10/16/2019   Lab Results  Component Value Date   TRIG 98 10/16/2019   Lab Results  Component Value Date   CHOLHDL 3.5 10/16/2019   Lab Results  Component Value Date   HGBA1C 5.7 (H) 09/07/2012

## 2020-04-15 ENCOUNTER — Encounter: Payer: Self-pay | Admitting: Family Medicine

## 2020-04-15 LAB — CMP14+EGFR
ALT: 9 IU/L (ref 0–44)
AST: 16 IU/L (ref 0–40)
Albumin/Globulin Ratio: 1.4 (ref 1.2–2.2)
Albumin: 4.3 g/dL (ref 3.7–4.7)
Alkaline Phosphatase: 117 IU/L (ref 48–121)
BUN/Creatinine Ratio: 19 (ref 10–24)
BUN: 20 mg/dL (ref 8–27)
Bilirubin Total: 0.7 mg/dL (ref 0.0–1.2)
CO2: 22 mmol/L (ref 20–29)
Calcium: 9.2 mg/dL (ref 8.6–10.2)
Chloride: 106 mmol/L (ref 96–106)
Creatinine, Ser: 1.06 mg/dL (ref 0.76–1.27)
GFR calc Af Amer: 79 mL/min/{1.73_m2} (ref >59)
GFR calc non Af Amer: 68 mL/min/{1.73_m2} (ref >59)
Globulin, Total: 3.1 g/dL (ref 1.5–4.5)
Glucose: 106 mg/dL — ABNORMAL HIGH (ref 65–99)
Potassium: 4.4 mmol/L (ref 3.5–5.2)
Sodium: 141 mmol/L (ref 134–144)
Total Protein: 7.4 g/dL (ref 6.0–8.5)

## 2020-04-15 LAB — LIPID PANEL
Chol/HDL Ratio: 3.5 ratio (ref 0.0–5.0)
Cholesterol, Total: 111 mg/dL (ref 100–199)
HDL: 32 mg/dL — ABNORMAL LOW (ref >39)
LDL Chol Calc (NIH): 61 mg/dL (ref 0–99)
Triglycerides: 90 mg/dL (ref 0–149)
VLDL Cholesterol Cal: 18 mg/dL (ref 5–40)

## 2020-04-15 LAB — HEPATITIS C ANTIBODY: Hep C Virus Ab: <0.1 s/co ratio (ref 0.0–0.9)

## 2020-06-09 ENCOUNTER — Other Ambulatory Visit: Payer: Self-pay

## 2020-06-09 MED ORDER — NITROGLYCERIN 0.3 MG SL SUBL: 0.3000 mg | SUBLINGUAL_TABLET | SUBLINGUAL | 1 refills | Status: DC | PRN

## 2020-06-09 MED ORDER — FUROSEMIDE 20 MG PO TABS: ORAL_TABLET | ORAL | 4 refills | Status: DC

## 2020-06-09 MED ORDER — ATORVASTATIN CALCIUM 40 MG PO TABS: 40.0000 mg | ORAL_TABLET | Freq: Every day | ORAL | 3 refills | Status: DC

## 2020-06-09 MED ORDER — SPIRONOLACTONE 25 MG PO TABS: ORAL_TABLET | ORAL | 3 refills | Status: DC

## 2020-07-09 ENCOUNTER — Other Ambulatory Visit: Payer: Self-pay | Admitting: Cardiology

## 2020-07-09 NOTE — Telephone Encounter (Signed)
°*  STAT* If patient is at the pharmacy, call can be transferred to refill team.   1. Which medications need to be refilled? (please list name of each medication and dose if known)  atorvastatin (LIPITOR) 40 MG tablet  2. Which pharmacy/location (including street and city if local pharmacy) is medication to be sent to? [facility] - [place], [place] - [address]  3. Do they need a 30 day or 90 day supply? 90 day supply

## 2020-07-21 ENCOUNTER — Other Ambulatory Visit: Payer: Self-pay | Admitting: Cardiology

## 2020-07-21 NOTE — Telephone Encounter (Signed)
°*  STAT* If patient is at the pharmacy, call can be transferred to refill team.   1. Which medications need to be refilled? (please list name of each medication and dose if known) atorvastatin (LIPITOR) 40 MG tablet  2. Which pharmacy/location (including street and city if local pharmacy) is medication to be sent to? [facility] - [place], [place] - [address]  3. Do they need a 30 day or 90 day supply? 90 day supply

## 2020-10-28 ENCOUNTER — Other Ambulatory Visit: Payer: Self-pay | Admitting: Cardiology

## 2021-03-22 ENCOUNTER — Encounter: Payer: Self-pay | Admitting: Family Medicine

## 2021-03-22 ENCOUNTER — Other Ambulatory Visit: Payer: Self-pay

## 2021-03-22 ENCOUNTER — Ambulatory Visit (INDEPENDENT_AMBULATORY_CARE_PROVIDER_SITE_OTHER): Payer: Medicare HMO | Admitting: Family Medicine

## 2021-03-22 VITALS — BP 133/81 | HR 74 | Temp 97.5°F | Ht 62.0 in | Wt 143.2 lb

## 2021-03-22 DIAGNOSIS — I10 Essential (primary) hypertension: Secondary | ICD-10-CM

## 2021-03-22 DIAGNOSIS — N50812 Left testicular pain: Secondary | ICD-10-CM

## 2021-03-22 DIAGNOSIS — Z0001 Encounter for general adult medical examination with abnormal findings: Secondary | ICD-10-CM

## 2021-03-22 DIAGNOSIS — E782 Mixed hyperlipidemia: Secondary | ICD-10-CM

## 2021-03-22 DIAGNOSIS — I2510 Atherosclerotic heart disease of native coronary artery without angina pectoris: Secondary | ICD-10-CM | POA: Diagnosis not present

## 2021-03-22 DIAGNOSIS — I6523 Occlusion and stenosis of bilateral carotid arteries: Secondary | ICD-10-CM

## 2021-03-22 DIAGNOSIS — R0989 Other specified symptoms and signs involving the circulatory and respiratory systems: Secondary | ICD-10-CM

## 2021-03-22 DIAGNOSIS — I739 Peripheral vascular disease, unspecified: Secondary | ICD-10-CM

## 2021-03-22 DIAGNOSIS — Z0000 Encounter for general adult medical examination without abnormal findings: Secondary | ICD-10-CM

## 2021-03-22 DIAGNOSIS — N50811 Right testicular pain: Secondary | ICD-10-CM

## 2021-03-22 DIAGNOSIS — I5022 Chronic systolic (congestive) heart failure: Secondary | ICD-10-CM

## 2021-03-22 MED ORDER — ATORVASTATIN CALCIUM 40 MG PO TABS: 40.0000 mg | ORAL_TABLET | Freq: Every day | ORAL | 3 refills | Status: DC

## 2021-03-22 NOTE — Progress Notes (Signed)
Assessment & Plan:  1. Well adult exam Preventive health education provided. Patient declined a low dose lung cancer screening CT.  - CBC with Differential/Platelet - CMP14+EGFR - Lipid panel - PSA, total and free  2. Mixed hyperlipidemia Labs to assess. - atorvastatin (LIPITOR) 40 MG tablet; Take 1 tablet (40 mg total) by mouth daily.  Dispense: 90 tablet; Refill: 3 - CMP14+EGFR - Lipid panel  3. Coronary artery disease involving native coronary artery of native heart without angina pectoris On a statin. - atorvastatin (LIPITOR) 40 MG tablet; Take 1 tablet (40 mg total) by mouth daily.  Dispense: 90 tablet; Refill: 3 - CMP14+EGFR - Lipid panel  4. Peripheral arterial disease ([facility]) On a statin. - atorvastatin (LIPITOR) 40 MG tablet; Take 1 tablet (40 mg total) by mouth daily.  Dispense: 90 tablet; Refill: 3 - CMP14+EGFR - Lipid panel  5. Bilateral carotid artery stenosis Referred back to vascular for follow-up since it has been 5 years.  - atorvastatin (LIPITOR) 40 MG tablet; Take 1 tablet (40 mg total) by mouth daily.  Dispense: 90 tablet; Refill: 3 - CMP14+EGFR - Lipid panel - Ambulatory referral to Vascular Surgery  6. Right carotid bruit Referred back to vascular for follow-up since it has been 5 years.  - Ambulatory referral to Vascular Surgery  7. Essential hypertension Well controlled on current regimen.  - CBC with Differential/Platelet - CMP14+EGFR - Lipid panel  8. Chronic systolic HF (heart failure) (HCC) Well controlled on current regimen. Managed by cardiology. - CBC with Differential/Platelet - CMP14+EGFR - Lipid panel  9. Pain in both testicles - US Scrotum; Future   Follow-up: Return in about 1 year (around 03/22/2022) for annual physical.   Ronnie Limes, MSN, APRN, FNP-C [facility]  Subjective:  Patient ID: [facility]nie [facility], male    DOB: 10/05/1945  Age: 75 y.o. MRN: [id number]  Patient Care Team: Loman Brooklyn,  FNP as PCP - General (Family Medicine) Minus Breeding, MD as Consulting Physician (Cardiology) Chevis Pretty, FNP as Nurse Practitioner (Family Medicine) [name], MD as Consulting Physician (Cardiology)   CC:  Chief Complaint  Patient presents with   Annual Exam    HPI Ronnie [facility] presents for his annual physical.   Occupation: Retired, Marital status: Widowed, Substance use: None Diet: Regular, Exercise: None Last eye exam: Scheduled at the end of this month Last dental exam: Last year Lung Cancer Screening with low-dose Chest CT: Declined Hepatitis C Screening: WNL 04/14/2020 PSA: Unknown Immunizations: Flu Vaccine:  not flu season Tdap Vaccine: up to date  Shingrix Vaccine: up to date  COVID-19 Vaccine: up to date Pneumonia Vaccine: up to date  Advanced Directives Patient does not have advanced directives including DNR, living will, healthcare power of attorney, financial power of attorney, and MOST form.   DEPRESSION SCREENING PHQ 2/9 Scores 03/22/2021 04/14/2020 10/16/2019 02/18/2016 12/18/2014  PHQ - 2 Score 0 0 0 0 0  PHQ- 9 Score 0 - - - -     Patient reports his testicles have hurting for a couple of months. It is not daily. It occurs in both testes, but not at the same time (goes back and forth). Pain doesn't last long. Denies fever, swelling, erythema, or warmth.   Patient has a history of bilateral carotid artery stenosis. The left side completely occluded years ago. He was previously seeing Dr. [name], vascular surgeon, but has not seen him since 2017. At that time, the plan was to repeat his  carotid ultrasound in one year (which was not done). Patient states his wife died that year and he just cancelled everything.   Review of Systems  Constitutional:  Negative for chills, fever, malaise/fatigue and weight loss.  HENT:  Positive for hearing loss. Negative for congestion, ear discharge, ear pain, nosebleeds, sinus pain, sore throat and tinnitus.    Eyes:  Positive for blurred vision. Negative for double vision, pain, discharge and redness.  Respiratory:  Negative for cough, shortness of breath and wheezing.   Cardiovascular:  Positive for chest pain (fleeting). Negative for palpitations and leg swelling.  Gastrointestinal:  Positive for abdominal pain (from his belt pushing in on his stomach). Negative for constipation, diarrhea, heartburn, nausea and vomiting.  Genitourinary:  Negative for dysuria, frequency and urgency.       Denies trouble initiating a urine stream, weak stream, split stream, nocturia, and dribbling.   Musculoskeletal:  Negative for myalgias.  Skin:  Negative for rash.  Neurological:  Negative for dizziness, seizures, weakness and headaches.  Psychiatric/Behavioral:  Negative for depression, substance abuse and suicidal ideas. The patient is not nervous/anxious.     Current Outpatient Medications:    aspirin EC 81 MG tablet, Take 1 tablet (81 mg total) by mouth daily., Disp: , Rfl:    atorvastatin (LIPITOR) 40 MG tablet, Take 1 tablet (40 mg total) by mouth daily., Disp: 90 tablet, Rfl: 3   carvedilol (COREG) 12.5 MG tablet, TAKE 1 TABLET TWICE A DAY, Disp: 180 tablet, Rfl: 3   furosemide (LASIX) 20 MG tablet, Take one tablet by mouth Mon., Wed., and Friday, Disp: 45 tablet, Rfl: 4   nitroGLYCERIN (NITROSTAT) 0.3 MG SL tablet, Place 1 tablet (0.3 mg total) under the tongue every 5 (five) minutes as needed for chest pain., Disp: 90 tablet, Rfl: 1   psyllium (METAMUCIL) 58.6 % powder, Take 1 packet by mouth as needed. , Disp: , Rfl:    spironolactone (ALDACTONE) 25 MG tablet, Take one by mouth tablet on Mon., Wed., and Friday, Disp: 40 tablet, Rfl: 3  No Known Allergies  Past Medical History:  Diagnosis Date   Arthritis    CAD (coronary artery disease) nonobstructive   a. 10/2012 Cath: LM 50, LAD 25p, 63m Diag 265mLCX 30p, 40d, OM1 60-70, OM3 30, RCA 40-5045mM 70p, PDA nl, EF 20%. c.  Stents to RCA circumvlex   06/2017 in [facility]   Cardiomyopathy, dilated ([facility]  a. 09/2012 Echo: EF 15-20%, diff HK, gr2 DD, Triv AI, Mod MR, mild TR/PR, mod-sev dil LA.   CHF (congestive heart failure) ([facility]  Diverticulitis    Essential hypertension 10/16/2019   ETOH abuse    Quit 1998   GERD (gastroesophageal reflux disease)    rarely (worse when he was drinking)   Itchy skin    [facility]ocardial infarction ([facility]  PAF (paroxysmal atrial fibrillation) [place]  PVD (peripheral vascular disease) ([facility]  a. 05/2013 s/p PTA/self-expanding stenting of the REIA and LCIA.   Retinal hole of right eye    Shortness of breath dyspnea    with exertion   Tobacco abuse    Quit Dec 2012    Past Surgical History:  Procedure Laterality Date   ABDOMINAL AORTAGRAM N/A 05/28/2013   Procedure: ABDOMINAL AORTAGRAM;  Surgeon: MuhWellington HampshireD;  [facility];  Service: Cardiovascular;  Laterality: N/A;   CAROTID ANGIOGRAM Bilateral 10/27/2014   Procedure: CAROTID ANGIOGRAM;  Surgeon: VanDurene Fruits  Pierre Bali, MD;  Location: [facility];  Service: Cardiovascular;  Laterality: Bilateral;   CAROTID ENDARTERECTOMY Left December 03, 2014   CEA   ENDARTERECTOMY Left 12/03/2014   Procedure: ENDARTERECTOMY CAROTID WITH PATCH ANGIOPLASTY;  Surgeon: Serafina Mitchell, MD;  Location: [facility];  Service: Vascular;  Laterality: Left;   EYE SURGERY Right    cataract surgery with lens implant   Neck cyst resection     PERCUTANEOUS STENT INTERVENTION Right 05/28/2013   Procedure: PERCUTANEOUS STENT INTERVENTION;  Surgeon: [name], MD;  Location: [facility];  Service: Cardiovascular;  Laterality: Right;   VASECTOMY      Family History  Problem Relation Age of Onset   Diabetes Mother    Diabetes Father    Diabetes Brother    COPD Brother    Breast cancer Sister 4   Diabetes Brother     Social History   Socioeconomic History   Marital status: Married    Spouse name: Not on file   Number of children: Not on file   Years of  education: Not on file   Highest education level: Not on file  Occupational History   Not on file  Tobacco Use   Smoking status: Former    Packs/day: 1.00    Years: 49.00    Pack years: 49.00    Types: Cigarettes    Start date: 08/30/2012    Quit date: 11/24/2012    Years since quitting: 8.3   Smokeless tobacco: Former    Types: Snuff  Substance and Sexual Activity   Alcohol use: No    Alcohol/week: 0.0 standard drinks    Comment: Former ETOH.  None since 1998 Prior to 1998 was a heavy drinker   Drug use: No   Sexual activity: Not on file  Other Topics Concern   Not on file  Social History Narrative   Not on file   Social Determinants of Health   Financial Resource Strain: Not on file  Food Insecurity: Not on file  Transportation Needs: Not on file  Physical Activity: Not on file  Stress: Not on file  Social Connections: Not on file  Intimate Partner Violence: Not on file      Objective:    BP 133/81   Pulse 74   Temp (!) 97.5 F (36.4 C) (Temporal)   Ht _0  (1.575 m)   Wt 143 lb 3.2 oz (65 kg)   SpO2 94%   BMI 26.19 kg/m   Wt Readings from Last 3 Encounters:  03/22/21 143 lb 3.2 oz (65 kg)  04/14/20 143 lb 6.4 oz (65 kg)  03/31/20 143 lb (64.9 kg)    Physical Exam Vitals reviewed. Exam conducted with a chaperone present.  Constitutional:      General: He is not in acute distress.    Appearance: Normal appearance. He is overweight. He is not ill-appearing, toxic-appearing or diaphoretic.  HENT:     Head: Normocephalic and atraumatic.     Right Ear: Tympanic membrane, ear canal and external ear normal. There is no impacted cerumen.     Left Ear: Tympanic membrane, ear canal and external ear normal. There is no impacted cerumen.     Nose: Nose normal. No congestion or rhinorrhea.     Mouth/Throat:     Mouth: Mucous membranes are moist.     Pharynx: Oropharynx is clear. No oropharyngeal exudate or posterior oropharyngeal erythema.  Eyes:     General:  No scleral icterus.  Right eye: No discharge.        Left eye: No discharge.     Conjunctiva/sclera: Conjunctivae normal.     Pupils: Pupils are equal, round, and reactive to light.  Neck:     Vascular: Carotid bruit (right side) present.  Cardiovascular:     Rate and Rhythm: Normal rate and regular rhythm.     Heart sounds: Normal heart sounds. No murmur heard.   No friction rub. No gallop.  Pulmonary:     Effort: Pulmonary effort is normal. No respiratory distress.     Breath sounds: Normal breath sounds. No stridor. No wheezing, rhonchi or rales.  Abdominal:     General: Abdomen is flat. Bowel sounds are normal. There is no distension.     Palpations: Abdomen is soft. There is no hepatomegaly, splenomegaly or mass.     Tenderness: There is no abdominal tenderness. There is no guarding or rebound.     Hernia: No hernia is present.  Genitourinary:    Pubic Area: No rash or pubic lice.      Penis: Normal.      Testes: Cremasteric reflex is present.        Right: Mass, tenderness, swelling, testicular hydrocele or varicocele not present. Right testis is descended. Cremasteric reflex is present.         Left: Mass, tenderness, swelling, testicular hydrocele or varicocele not present. Left testis is descended. Cremasteric reflex is present.      Epididymis:     Right: Not inflamed or enlarged. Tenderness present. No mass.     Left: Not inflamed or enlarged. No mass or tenderness.  Musculoskeletal:        General: Normal range of motion.     Cervical back: Normal range of motion and neck supple. No rigidity. No muscular tenderness.     Right lower leg: No edema.     Left lower leg: No edema.  Lymphadenopathy:     Cervical: No cervical adenopathy.  Skin:    General: Skin is warm and dry.     Capillary Refill: Capillary refill takes less than 2 seconds.  Neurological:     General: No focal deficit present.     Mental Status: He is alert and oriented to person, place, and time.  Mental status is at baseline.  Psychiatric:        Mood and Affect: Mood normal.        Behavior: Behavior normal.        Thought Content: Thought content normal.        Judgment: Judgment normal.    Lab Results  Component Value Date   TSH 1.998 09/04/2012   Lab Results  Component Value Date   WBC 8.5 10/16/2019   HGB 15.4 10/16/2019   HCT 45.1 10/16/2019   MCV 92 10/16/2019   PLT 212 10/16/2019   Lab Results  Component Value Date   NA 141 04/14/2020   K 4.4 04/14/2020   CO2 22 04/14/2020   GLUCOSE 106 (H) 04/14/2020   BUN 20 04/14/2020   CREATININE 1.06 04/14/2020   BILITOT 0.7 04/14/2020   ALKPHOS 117 04/14/2020   AST 16 04/14/2020   ALT 9 04/14/2020   PROT 7.4 04/14/2020   ALBUMIN 4.3 04/14/2020   CALCIUM 9.2 04/14/2020   ANIONGAP 7 12/04/2014   GFR 53.38 (L) 02/17/2014   Lab Results  Component Value Date   CHOL 111 04/14/2020   Lab Results  Component Value Date   HDL 32 (  L) 04/14/2020   Lab Results  Component Value Date   LDLCALC 61 04/14/2020   Lab Results  Component Value Date   TRIG 90 04/14/2020   Lab Results  Component Value Date   CHOLHDL 3.5 04/14/2020   Lab Results  Component Value Date   HGBA1C 5.7 (H) 09/07/2012

## 2021-03-22 NOTE — Patient Instructions (Signed)
Preventive Care 76 Years and Older, Male Preventive care refers to lifestyle choices and visits with your health care provider that can promote health and wellness. This includes: A yearly physical exam. This is also called an annual wellness visit. Regular dental and eye exams. Immunizations. Screening for certain conditions. Healthy lifestyle choices, such as: Eating a healthy diet. Getting regular exercise. Not using drugs or products that contain nicotine and tobacco. Limiting alcohol use. What can I expect for my preventive care visit? Physical exam Your health care provider will check your: Height and weight. These may be used to calculate your BMI (body mass index). BMI is a measurement that tells if you are at a healthy weight. Heart rate and blood pressure. Body temperature. Skin for abnormal spots. Counseling Your health care provider may ask you questions about your: Past medical problems. Family's medical history. Alcohol, tobacco, and drug use. Emotional well-being. Home life and relationship well-being. Sexual activity. Diet, exercise, and sleep habits. History of falls. Memory and ability to understand (cognition). Work and work environment. Access to firearms. What immunizations do I need?  Vaccines are usually given at various ages, according to a schedule. Your health care provider will recommend vaccines for you based on your age, medicalhistory, and lifestyle or other factors, such as travel or where you work. What tests do I need? Blood tests Lipid and cholesterol levels. These may be checked every 5 years, or more often depending on your overall health. Hepatitis C test. Hepatitis B test. Screening Lung cancer screening. You may have this screening every year starting at age 55 if you have a 30-pack-year history of smoking and currently smoke or have quit within the past 15 years. Colorectal cancer screening. All adults should have this screening  starting at age 50 and continuing until age 75. Your health care provider may recommend screening at age 45 if you are at increased risk. You will have tests every 1-10 years, depending on your results and the type of screening test. Prostate cancer screening. Recommendations will vary depending on your family history and other risks. Genital exam to check for testicular cancer or hernias. Diabetes screening. This is done by checking your blood sugar (glucose) after you have not eaten for a while (fasting). You may have this done every 1-3 years. Abdominal aortic aneurysm (AAA) screening. You may need this if you are a current or former smoker. STD (sexually transmitted disease) testing, if you are at risk. Follow these instructions at home: Eating and drinking  Eat a diet that includes fresh fruits and vegetables, whole grains, lean protein, and low-fat dairy products. Limit your intake of foods with high amounts of sugar, saturated fats, and salt. Take vitamin and mineral supplements as recommended by your health care provider. Do not drink alcohol if your health care provider tells you not to drink. If you drink alcohol: Limit how much you have to 0-2 drinks a day. Be aware of how much alcohol is in your drink. In the [place] one drink equals one 12 oz bottle of beer (355 mL), one 5 oz glass of wine (148 mL), or one 1 oz glass of hard liquor (44 mL).  Lifestyle Take daily care of your teeth and gums. Brush your teeth every morning and night with fluoride toothpaste. Floss one time each day. Stay active. Exercise for at least 30 minutes 5 or more days each week. Do not use any products that contain nicotine or tobacco, such as cigarettes, e-cigarettes, and chewing tobacco.   If you need help quitting, ask your health care provider. Do not use drugs. If you are sexually active, practice safe sex. Use a condom or other form of protection to prevent STIs (sexually transmitted infections). Talk  with your health care provider about taking a low-dose aspirin or statin. Find healthy ways to cope with stress, such as: Meditation, yoga, or listening to music. Journaling. Talking to a trusted person. Spending time with friends and family. Safety Always wear your seat belt while driving or riding in a vehicle. Do not drive: If you have been drinking alcohol. Do not ride with someone who has been drinking. When you are tired or distracted. While texting. Wear a helmet and other protective equipment during sports activities. If you have firearms in your house, make sure you follow all gun safety procedures. What's next? Visit your health care provider once a year for an annual wellness visit. Ask your health care provider how often you should have your eyes and teeth checked. Stay up to date on all vaccines. This information is not intended to replace advice given to you by your health care provider. Make sure you discuss any questions you have with your healthcare provider. Document Revised: 06/03/2019 Document Reviewed: 08/29/2018 Elsevier Patient Education  2022 [employer]  

## 2021-03-23 ENCOUNTER — Other Ambulatory Visit: Payer: Self-pay | Admitting: Family Medicine

## 2021-03-23 DIAGNOSIS — R972 Elevated prostate specific antigen [PSA]: Secondary | ICD-10-CM

## 2021-03-23 LAB — CMP14+EGFR
ALT: 10 IU/L (ref 0–44)
AST: 17 IU/L (ref 0–40)
Albumin/Globulin Ratio: 1.5 (ref 1.2–2.2)
Albumin: 4.3 g/dL (ref 3.7–4.7)
Alkaline Phosphatase: 116 IU/L (ref 44–121)
BUN/Creatinine Ratio: 20 (ref 10–24)
BUN: 20 mg/dL (ref 8–27)
Bilirubin Total: 0.8 mg/dL (ref 0.0–1.2)
CO2: 23 mmol/L (ref 20–29)
Calcium: 9.1 mg/dL (ref 8.6–10.2)
Chloride: 102 mmol/L (ref 96–106)
Creatinine, Ser: 1.01 mg/dL (ref 0.76–1.27)
Globulin, Total: 2.9 g/dL (ref 1.5–4.5)
Glucose: 99 mg/dL (ref 65–99)
Potassium: 4.5 mmol/L (ref 3.5–5.2)
Sodium: 139 mmol/L (ref 134–144)
Total Protein: 7.2 g/dL (ref 6.0–8.5)
eGFR: 77 mL/min/{1.73_m2} (ref >59)

## 2021-03-23 LAB — LIPID PANEL
Chol/HDL Ratio: 3.9 ratio (ref 0.0–5.0)
Cholesterol, Total: 112 mg/dL (ref 100–199)
HDL: 29 mg/dL — ABNORMAL LOW (ref >39)
LDL Chol Calc (NIH): 64 mg/dL (ref 0–99)
Triglycerides: 102 mg/dL (ref 0–149)
VLDL Cholesterol Cal: 19 mg/dL (ref 5–40)

## 2021-03-23 LAB — CBC WITH DIFFERENTIAL/PLATELET
Basophils Absolute: 0.1 10*3/uL (ref 0.0–0.2)
Basos: 1 %
EOS (ABSOLUTE): 0 10*3/uL (ref 0.0–0.4)
Eos: 0 %
Hematocrit: 44.6 % (ref 37.5–51.0)
Hemoglobin: 15 g/dL (ref 13.0–17.7)
Immature Grans (Abs): 0 10*3/uL (ref 0.0–0.1)
Immature Granulocytes: 0 %
Lymphocytes Absolute: 1.9 10*3/uL (ref 0.7–3.1)
Lymphs: 21 %
MCH: 32.3 pg (ref 26.6–33.0)
MCHC: 33.6 g/dL (ref 31.5–35.7)
MCV: 96 fL (ref 79–97)
Monocytes Absolute: 0.8 10*3/uL (ref 0.1–0.9)
Monocytes: 9 %
Neutrophils Absolute: 6.4 10*3/uL (ref 1.4–7.0)
Neutrophils: 69 %
Platelets: 183 10*3/uL (ref 150–450)
RBC: 4.65 x10E6/uL (ref 4.14–5.80)
RDW: 12.5 % (ref 11.6–15.4)
WBC: 9.3 10*3/uL (ref 3.4–10.8)

## 2021-03-23 LAB — PSA, TOTAL AND FREE
PSA, Free Pct: 16 %
PSA, Free: 0.8 ng/mL
Prostate Specific Ag, Serum: 5 ng/mL — ABNORMAL HIGH (ref 0.0–4.0)

## 2021-03-24 ENCOUNTER — Telehealth: Payer: Self-pay | Admitting: Family Medicine

## 2021-03-24 DIAGNOSIS — N50812 Left testicular pain: Secondary | ICD-10-CM

## 2021-03-24 DIAGNOSIS — N50811 Right testicular pain: Secondary | ICD-10-CM

## 2021-03-24 NOTE — Telephone Encounter (Signed)
Order corrected to US Scrotum with Doppler as requested.

## 2021-04-04 ENCOUNTER — Ambulatory Visit (HOSPITAL_COMMUNITY)
Admission: RE | Admit: 2021-04-04 | Discharge: 2021-04-04 | Disposition: A | Discharge status: Home / Self Care | Payer: Medicare HMO | Source: Ambulatory Visit | Attending: Family Medicine | Admitting: Family Medicine

## 2021-04-04 ENCOUNTER — Other Ambulatory Visit: Payer: Self-pay

## 2021-04-04 DIAGNOSIS — N50811 Right testicular pain: Secondary | ICD-10-CM | POA: Insufficient documentation

## 2021-04-04 DIAGNOSIS — N50812 Left testicular pain: Secondary | ICD-10-CM | POA: Diagnosis present

## 2021-04-20 ENCOUNTER — Other Ambulatory Visit: Payer: Self-pay

## 2021-04-20 DIAGNOSIS — I6523 Occlusion and stenosis of bilateral carotid arteries: Secondary | ICD-10-CM

## 2021-04-25 NOTE — Progress Notes (Signed)
Cardiology Office Note   Date:  04/27/2021   ID:  Ronnie Harvey, DOB May 03, 1946, MRN [id number]  PCP:  Gwenlyn Fudge, FNP  Cardiologist:   None Referring:  Gwenlyn Fudge, FNP  Chief Complaint  Patient presents with   Cardiomyopathy       History of Present Illness: Ronnie Harvey is a 75 y.o. male who presents for followup of cardiomyopathy. He was seen in consultation by [place] at the time of diverticulitis and bowel microperforation a few years ago. He had atrial fibrillation as part of this hospitalization. He had respiratory failure and required intubation. His ejection fraction was 20%.  He was not anticoagulated at that time because of his ongoing acute issues. Cardiac catheterization demonstrated left main 50% stenosis, LAD 40% stenosis, circumflex 30% followed by distal 40% stenosis, small obtuse marginal 60-70% stenosis and right coronary artery 40-50% stenosis. The EF was 20%.  He has also had PTA and stenting of his right iliac and left common iliac by Dr. Kirke Corin.  I did repeat an echo in 2015 and the EF was about 30% and was up to 40 - 45% in March 2017.  He is status post CEA.  However, follow up angiography demonstrates this to now be occluded on the left.  He has only mild disease on the right with some subclavian disease.    Since I last saw him he has done well.  He likes to work on doing some puzzles.  He does some yard work including push mowing a ditch.  With this he denies any new cardiovascular symptoms.  He gets fatigued when he stops what he is doing a rest for a little bit and then can go on.  He is not describing any new chest pressure, neck or arm discomfort.  He has no palpitations, presyncope or syncope.  He has had no weight gain or edema.     Past Medical History:  Diagnosis Date   Arthritis    CAD (coronary artery disease) nonobstructive   a. 10/2012 Cath: LM 50, LAD 25p, 42m, Diag 32m, LCX 30p, 40d, OM1 60-70, OM3 30, RCA 40-30m, AM 70p, PDA nl, EF  20%. c.  Stents to RCA circumvlex  06/2017 in [place].     Cardiomyopathy, dilated (HCC)    a. 09/2012 Echo: EF 15-20%, diff HK, gr2 DD, Triv AI, Mod MR, mild TR/PR, mod-sev dil LA.   CHF (congestive heart failure) (HCC)    Diverticulitis    Essential hypertension 10/16/2019   ETOH abuse    Quit 1998   GERD (gastroesophageal reflux disease)    rarely (worse when he was drinking)   Itchy skin    Myocardial infarction (HCC)    PAF (paroxysmal atrial fibrillation) (HCC)    PVD (peripheral vascular disease) (HCC)    a. 05/2013 s/p PTA/self-expanding stenting of the REIA and LCIA.   Retinal hole of right eye    Shortness of breath dyspnea    with exertion   Tobacco abuse    Quit Dec 2012    Past Surgical History:  Procedure Laterality Date   ABDOMINAL AORTAGRAM N/A 05/28/2013   Procedure: ABDOMINAL AORTAGRAM;  Surgeon: Iran Ouch, MD;  Location: [facility] CATH LAB;  Service: Cardiovascular;  Laterality: N/A;   CAROTID ANGIOGRAM Bilateral 10/27/2014   Procedure: CAROTID ANGIOGRAM;  Surgeon: Nada Libman, MD;  Location: [facility] CATH LAB;  Service: Cardiovascular;  Laterality: Bilateral;   CAROTID ENDARTERECTOMY Left December 03, 2014  CEA   ENDARTERECTOMY Left 12/03/2014   Procedure: ENDARTERECTOMY CAROTID WITH PATCH ANGIOPLASTY;  Surgeon: Nada Libman, MD;  Location: [facility] OR;  Service: Vascular;  Laterality: Left;   EYE SURGERY Right    cataract surgery with lens implant   Neck cyst resection     PERCUTANEOUS STENT INTERVENTION Right 05/28/2013   Procedure: PERCUTANEOUS STENT INTERVENTION;  Surgeon: Iran Ouch, MD;  Location: [facility] CATH LAB;  Service: Cardiovascular;  Laterality: Right;   VASECTOMY       Current Outpatient Medications  Medication Sig Dispense Refill   aspirin EC 81 MG tablet Take 1 tablet (81 mg total) by mouth daily.     atorvastatin (LIPITOR) 40 MG tablet Take 1 tablet (40 mg total) by mouth daily. 90 tablet 3   carvedilol (COREG) 12.5 MG tablet TAKE 1 TABLET TWICE  A DAY 180 tablet 3   furosemide (LASIX) 20 MG tablet Take one tablet by mouth Mon., Wed., and Friday 45 tablet 4   nitroGLYCERIN (NITROSTAT) 0.3 MG SL tablet Place 1 tablet (0.3 mg total) under the tongue every 5 (five) minutes as needed for chest pain. 90 tablet 1   NON FORMULARY Colloidal silver 1 oz every morning.     psyllium (METAMUCIL) 58.6 % powder Take 1 packet by mouth as needed.      spironolactone (ALDACTONE) 25 MG tablet Take one by mouth tablet on Mon., Wed., and Friday 40 tablet 3   No current facility-administered medications for this visit.    Allergies:   Patient has no known allergies.   ROS:  Please see the history of present illness.   Otherwise, review of systems are positive for none.   All other systems are reviewed and negative.    PHYSICAL EXAM: VS:  BP 112/80   Pulse 65   Ht 5\' 2"  (1.575 m)   Wt 137 lb (62.1 kg)   BMI 25.06 kg/m  , BMI Body mass index is 25.06 kg/m. GENERAL:  Well appearing NECK:  No jugular venous distention, waveform within normal limits, carotid upstroke brisk and symmetric, no bruits, no thyromegaly LUNGS:  Clear to auscultation bilaterally CHEST:  Unremarkable HEART:  PMI not displaced or sustained,S1 and S2 within normal limits, no S3, no S4, no clicks, no rubs, no murmurs ABD:  Flat, positive bowel sounds normal in frequency in pitch, no bruits, no rebound, no guarding, no midline pulsatile mass, no hepatomegaly, no splenomegaly EXT:  2 plus pulses throughout, no edema, no cyanosis no clubbing  EKG:  EKG is  ordered today. The ekg ordered today demonstrates sinus rhythm, rate 65, axis within normal limits, intervals within normal limits, no acute ST-T wave changes   Recent Labs: 03/22/2021: ALT 10; BUN 20; Creatinine, Ser 1.01; Hemoglobin 15.0; Platelets 183; Potassium 4.5; Sodium 139    Lipid Panel    Component Value Date/Time   CHOL 112 03/22/2021 1141   TRIG 102 03/22/2021 1141   HDL 29 (L) 03/22/2021 1141   CHOLHDL 3.9  03/22/2021 1141   CHOLHDL 4 09/02/2013 0853   VLDL 41.2 (H) 09/02/2013 0853   LDLCALC 64 03/22/2021 1141   LDLDIRECT 74.9 09/02/2013 0853      Wt Readings from Last 3 Encounters:  04/27/21 137 lb (62.1 kg)  03/22/21 143 lb 3.2 oz (65 kg)  04/14/20 143 lb 6.4 oz (65 kg)      Other studies Reviewed: Additional studies/ records that were reviewed today include: Labs Review of the above records demonstrates:  See elsewhere  ASSESSMENT AND PLAN:  Cardiomyopathy I would not suspect his ejection fraction to be reduced compared to previous.  He seems to be euvolemic.  No change in therapy.   Atrial fibrillation He has not had any symptomatic paroxysms of this.  He has had no atrial fibrillation documented since he stopped drinking alcohol.   CAD The patient has no new sypmtoms.  No further cardiovascular testing is indicated.  We will continue with aggressive risk reduction and meds as listed.  HTN The blood pressure is well controlled.  No change in therapy.   Carotid stenosis He is status post CEA.  This is now occluded.  He has follow-up with Dr. Myra Gianotti soon.  Dyslipidemia:  64 with an HDL of 29 in July.  No change in therapy.   Current medicines are reviewed at length with the patient today.  The patient does not have concerns regarding medicines.  The following changes have been made:  no change  Labs/ tests ordered today include: None  Orders Placed This Encounter  Procedures   EKG 12-Lead      Disposition:   FU with me in 12 months.     Signed, [name], MD  04/27/2021 12:53 PM    [facility]

## 2021-04-27 ENCOUNTER — Ambulatory Visit (INDEPENDENT_AMBULATORY_CARE_PROVIDER_SITE_OTHER): Payer: Medicare HMO | Admitting: Cardiology

## 2021-04-27 ENCOUNTER — Other Ambulatory Visit: Payer: Medicare HMO

## 2021-04-27 ENCOUNTER — Other Ambulatory Visit: Payer: Self-pay

## 2021-04-27 ENCOUNTER — Encounter: Payer: Self-pay | Admitting: Cardiology

## 2021-04-27 VITALS — BP 112/80 | HR 65 | Ht 62.0 in | Wt 137.0 lb

## 2021-04-27 DIAGNOSIS — I2510 Atherosclerotic heart disease of native coronary artery without angina pectoris: Secondary | ICD-10-CM

## 2021-04-27 DIAGNOSIS — I255 Ischemic cardiomyopathy: Secondary | ICD-10-CM | POA: Diagnosis not present

## 2021-04-27 DIAGNOSIS — R972 Elevated prostate specific antigen [PSA]: Secondary | ICD-10-CM

## 2021-04-27 DIAGNOSIS — I6523 Occlusion and stenosis of bilateral carotid arteries: Secondary | ICD-10-CM

## 2021-04-27 DIAGNOSIS — I480 Paroxysmal atrial fibrillation: Secondary | ICD-10-CM

## 2021-04-27 DIAGNOSIS — I10 Essential (primary) hypertension: Secondary | ICD-10-CM

## 2021-04-27 DIAGNOSIS — E785 Hyperlipidemia, unspecified: Secondary | ICD-10-CM

## 2021-04-27 NOTE — Patient Instructions (Signed)
Medication Instructions:  The current medical regimen is effective;  continue present plan and medications.  *If you need a refill on your cardiac medications before your next appointment, please call your pharmacy*  Follow-Up: At [facility], you and your health needs are our priority.  As part of our continuing mission to provide you with exceptional heart care, we have created designated Provider Care Teams.  These Care Teams include your primary Cardiologist (physician) and Advanced Practice Providers (APPs -  Physician Assistants and Nurse Practitioners) who all work together to provide you with the care you need, when you need it.  We recommend signing up for the patient portal called "MyChart".  Sign up information is provided on this After Visit Summary.  MyChart is used to connect with patients for Virtual Visits (Telemedicine).  Patients are able to view lab/test results, encounter notes, upcoming appointments, etc.  Non-urgent messages can be sent to your provider as well.   To learn more about what you can do with MyChart, go to [email or web address].    Your next appointment:   1 year(s)  The format for your next appointment:   In Person  Provider:   James Hochrein, MD   Thank you for choosing [facility]!!    

## 2021-04-28 LAB — PSA, TOTAL AND FREE
PSA, Free Pct: 16.6 %
PSA, Free: 1.11 ng/mL
Prostate Specific Ag, Serum: 6.7 ng/mL — ABNORMAL HIGH (ref 0.0–4.0)

## 2021-04-29 NOTE — Progress Notes (Signed)
Pt aware of results. Go ahead and place referral.

## 2021-05-09 ENCOUNTER — Ambulatory Visit (HOSPITAL_COMMUNITY)
Admission: RE | Admit: 2021-05-09 | Discharge: 2021-05-09 | Disposition: A | Discharge status: Home / Self Care | Payer: Medicare HMO | Source: Ambulatory Visit | Attending: Surgery | Admitting: Surgery

## 2021-05-09 ENCOUNTER — Encounter: Payer: Medicare HMO | Admitting: Surgery

## 2021-05-09 DIAGNOSIS — I6523 Occlusion and stenosis of bilateral carotid arteries: Secondary | ICD-10-CM | POA: Diagnosis not present

## 2021-05-10 ENCOUNTER — Other Ambulatory Visit: Payer: Self-pay

## 2021-05-12 ENCOUNTER — Other Ambulatory Visit: Payer: Self-pay | Admitting: Cardiology

## 2021-05-12 NOTE — Telephone Encounter (Signed)
Rx(s) sent to pharmacy electronically.  

## 2021-05-13 ENCOUNTER — Other Ambulatory Visit: Payer: Self-pay

## 2021-05-13 ENCOUNTER — Ambulatory Visit (INDEPENDENT_AMBULATORY_CARE_PROVIDER_SITE_OTHER): Payer: Medicare HMO | Admitting: Surgery

## 2021-05-13 DIAGNOSIS — I70213 Atherosclerosis of native arteries of extremities with intermittent claudication, bilateral legs: Secondary | ICD-10-CM

## 2021-05-13 NOTE — Progress Notes (Signed)
[facility] [place]  Patient name: Ronnie Harvey MRN: [id number] DOB: 02-04-1946 Sex: male   Virtual Visit via Telephone Note   This visit type was conducted due to national recommendations for restrictions regarding the COVID-19 Pandemic (e.g. social distancing) in an effort to limit this patient's exposure and mitigate transmission in our community.  Due to his co-morbid illnesses, this patient is at least at moderate risk for complications without adequate follow up.  This format is felt to be most appropriate for this patient at this time.  The patient did not have access to video technology/had technical difficulties with video requiring transitioning to audio format only (telephone).  All issues noted in this document were discussed and addressed.  No physical exam could be performed with this format.    Patient Location: Home Provider Location: Office/Clinic    REASON FOR APPOINTMENT:    Follow up  HISTORY OF PRESENT ILLNESS:   Ronnie Harvey is a 75 y.o. male who underwent left carotid endarterectomy with patch angioplasty on 12/03/2014 for asymptomatic stenosis.  Intraoperatively there was a 90% stenosis.  There were significant inflammatory changes on both the inside of the outside of the artery with very friable surfaces.  When he came in last week for his duplex, there was the possibility of an occlusion.  He is asymptomatic.  I sent him for a CT scan to confirm whether or not he had an occlusion.  CT scan  confirmed the carotid occlusion. [name] is a 75 y.o. male who underwent left carotid endarterectomy with patch angioplasty on 12/03/2014 for asymptomatic stenosis.  Intraoperatively there was a 90% stenosis.  There were significant inflammatory changes on both the inside of the outside of the artery with very friable surfaces.  When he came in last week for his duplex, there was the possibility of an occlusion.  He is asymptomatic.   I sent him for a CT scan to confirm whether or not he had an occlusion.  CT scan  confirmed the carotid occlusion.    PAST MEDICAL HISTORY    Past Medical History:  Diagnosis Date   Arthritis    CAD (coronary artery disease) nonobstructive   a. 10/2012 Cath: LM 50, LAD 25p, 23m, Diag 28m, LCX 30p, 40d, OM1 60-70, OM3 30, RCA 40-26m, AM 70p, PDA nl, EF 20%. c.  Stents to RCA circumvlex  06/2017 in [place].     Cardiomyopathy, dilated (HCC)    a. 09/2012 Echo: EF 15-20%, diff HK, gr2 DD, Triv AI, Mod MR, mild TR/PR, mod-sev dil LA.   CHF (congestive heart failure) (HCC)    Diverticulitis    Essential hypertension 10/16/2019   ETOH abuse    Quit 1998   GERD (gastroesophageal reflux disease)    rarely (worse when he was drinking)   Itchy skin    Myocardial infarction (HCC)    PAF (paroxysmal atrial fibrillation) (HCC)    PVD (peripheral vascular disease) (HCC)    a. 05/2013 s/p PTA/self-expanding stenting of the REIA and LCIA.   Retinal hole of right eye    Shortness of breath dyspnea    with exertion   Tobacco abuse    Quit Dec 2012     FAMILY HISTORY   Family History  Problem Relation Age of Onset   Diabetes Mother    Diabetes Father    Diabetes Brother    COPD Brother  Breast cancer Sister 95   Diabetes Brother     SOCIAL HISTORY:   Social History   Socioeconomic History   Marital status: Widowed    Spouse name: Not on file   Number of children: Not on file   Years of education: Not on file   Highest education level: Not on file  Occupational History   Occupation: Retired  Tobacco Use   Smoking status: Former    Packs/day: 1.00    Years: 49.00    Pack years: 49.00    Types: Cigarettes    Start date: 08/30/2012    Quit date: 11/24/2012    Years since quitting: 8.4   Smokeless tobacco: Former    Types: Snuff  Substance and Sexual Activity   Alcohol use: No    Alcohol/week: 0.0 standard drinks    Comment: Former ETOH.  None since 1998 Prior to 1998 was  a heavy drinker   Drug use: No   Sexual activity: Not on file  Other Topics Concern   Not on file  Social History Narrative   Not on file   Social Determinants of Health   Financial Resource Strain: Not on file  Food Insecurity: Not on file  Transportation Needs: Not on file  Physical Activity: Not on file  Stress: Not on file  Social Connections: Not on file  Intimate Partner Violence: Not on file    ALLERGIES:    No Known Allergies  CURRENT MEDICATIONS:    Current Outpatient Medications  Medication Sig Dispense Refill   aspirin EC 81 MG tablet Take 1 tablet (81 mg total) by mouth daily.     atorvastatin (LIPITOR) 40 MG tablet Take 1 tablet (40 mg total) by mouth daily. 90 tablet 3   carvedilol (COREG) 12.5 MG tablet TAKE 1 TABLET TWICE A DAY 180 tablet 3   furosemide (LASIX) 20 MG tablet Take one tablet by mouth Mon., Wed., and Friday 45 tablet 4   nitroGLYCERIN (NITROSTAT) 0.3 MG SL tablet Place 1 tablet (0.3 mg total) under the tongue every 5 (five) minutes as needed for chest pain. 90 tablet 1   NON FORMULARY Colloidal silver 1 oz every morning.     psyllium (METAMUCIL) 58.6 % powder Take 1 packet by mouth as needed.      spironolactone (ALDACTONE) 25 MG tablet TAKE 1 TABLET ON MONDAY, WEDNESDAY AND FRIDAY 40 tablet 3   No current facility-administered medications for this visit.    REVIEW OF SYSTEMS:   Please see the history of present illness.     All other systems reviewed and are negative.  PHYSICAL EXAM:   There were no vitals filed for this visit.    Recent Labs: 03/22/2021: ALT 10; BUN 20; Creatinine, Ser 1.01; Hemoglobin 15.0; Platelets 183; Potassium 4.5; Sodium 139   Recent Lipid Panel Lab Results  Component Value Date/Time   CHOL 112 03/22/2021 11:41 AM   TRIG 102 03/22/2021 11:41 AM   HDL 29 (L) 03/22/2021 11:41 AM   CHOLHDL 3.9 03/22/2021 11:41 AM   CHOLHDL 4 09/02/2013 08:53 AM   LDLCALC 64 03/22/2021 11:41 AM   LDLDIRECT 74.9  09/02/2013 08:53 AM    Wt Readings from Last 3 Encounters:  04/27/21 137 lb (62.1 kg)  03/22/21 143 lb 3.2 oz (65 kg)  04/14/20 143 lb 6.4 oz (65 kg)     STUDIES:   I have reviewed hsi u/s with the following findings:  Right Carotid: Velocities in the right ICA are consistent with  a 60-79%                 stenosis.   Left Carotid: Evidence consistent with a total occlusion of the left ICA.   Vertebrals: Bilateral vertebral arteries demonstrate antegrade flow. Left              vertebral artery demonstrates high resistant flow.   ASSESSMENT and PLAN   Asymptomatic Right carotid stenosis on  the wetting of left carotid occlusion.  He remains asymptomatic.  He will return in 6 months for surveillance duplex.  If he requires intervention in the future, it would be best treated with stenting given the amount of inflamation associated with the left carotid at the time of CEA    Time:   Today, I have spent 7 minutes with the patient with telehealth technology discussing the above problems.        Charlena Cross, MD, FACS [facility] [phone] Pager [phone]

## 2021-05-15 ENCOUNTER — Encounter: Payer: Self-pay | Admitting: Surgery

## 2021-05-18 ENCOUNTER — Other Ambulatory Visit: Payer: Self-pay

## 2021-05-18 DIAGNOSIS — I70213 Atherosclerosis of native arteries of extremities with intermittent claudication, bilateral legs: Secondary | ICD-10-CM

## 2021-05-18 DIAGNOSIS — I6523 Occlusion and stenosis of bilateral carotid arteries: Secondary | ICD-10-CM

## 2021-07-08 ENCOUNTER — Other Ambulatory Visit: Payer: Self-pay

## 2021-07-08 ENCOUNTER — Encounter: Payer: Self-pay | Admitting: Urology

## 2021-07-08 ENCOUNTER — Ambulatory Visit: Payer: Medicare HMO | Admitting: Urology

## 2021-07-08 VITALS — BP 148/76 | HR 69

## 2021-07-08 DIAGNOSIS — R972 Elevated prostate specific antigen [PSA]: Secondary | ICD-10-CM | POA: Diagnosis not present

## 2021-07-08 LAB — URINALYSIS, ROUTINE W REFLEX MICROSCOPIC
Bilirubin, UA: NEGATIVE
Glucose, UA: NEGATIVE
Ketones, UA: NEGATIVE
Leukocytes,UA: NEGATIVE
Nitrite, UA: NEGATIVE
Protein,UA: NEGATIVE
Specific Gravity, UA: 1.01 (ref 1.005–1.030)
Urobilinogen, Ur: 0.2 mg/dL (ref 0.2–1.0)
pH, UA: 6.5 (ref 5.0–7.5)

## 2021-07-08 LAB — MICROSCOPIC EXAMINATION
Bacteria, UA: NONE SEEN
Epithelial Cells (non renal): NONE SEEN /hpf (ref 0–10)
RBC, Urine: NONE SEEN /hpf (ref 0–2)
Renal Epithel, UA: NONE SEEN /hpf
WBC, UA: NONE SEEN /hpf (ref 0–5)

## 2021-07-08 NOTE — Progress Notes (Signed)
07/08/2021 9:51 AM   [name] Mar 22, 1946 [id number]  Referring provider: Gwenlyn Fudge, FNP [address] [place],  [place] [address]  Elevated PSA   HPI: Ronnie Harvey is a 75yo here for evaluation of elevated PSA. PSA was 5.0 with 16% free. No significant LUTS. IPSS 11 QOL 2. Nocturia 2x. Urine stream strong. PVR 91cc. No prior PSA values. No family hx of prostate cancer. No other complaints today.    PMH: Past Medical History:  Diagnosis Date   Arthritis    CAD (coronary artery disease) nonobstructive   a. 10/2012 Cath: LM 50, LAD 25p, 42m, Diag 38m, LCX 30p, 40d, OM1 60-70, OM3 30, RCA 40-63m, AM 70p, PDA nl, EF 20%. c.  Stents to RCA circumvlex  06/2017 in [place].     Cardiomyopathy, dilated (HCC)    a. 09/2012 Echo: EF 15-20%, diff HK, gr2 DD, Triv AI, Mod Ronnie, mild TR/PR, mod-sev dil LA.   CHF (congestive heart failure) (HCC)    Diverticulitis    Essential hypertension 10/16/2019   ETOH abuse    Quit 1998   GERD (gastroesophageal reflux disease)    rarely (worse when he was drinking)   Itchy skin    Myocardial infarction (HCC)    PAF (paroxysmal atrial fibrillation) (HCC)    PVD (peripheral vascular disease) (HCC)    a. 05/2013 s/p PTA/self-expanding stenting of the REIA and LCIA.   Retinal hole of right eye    Shortness of breath dyspnea    with exertion   Tobacco abuse    Quit Dec 2012    Surgical History: Past Surgical History:  Procedure Laterality Date   ABDOMINAL AORTAGRAM N/A 05/28/2013   Procedure: ABDOMINAL AORTAGRAM;  Surgeon: Iran Ouch, MD;  Location: [facility] CATH LAB;  Service: Cardiovascular;  Laterality: N/A;   CAROTID ANGIOGRAM Bilateral 10/27/2014   Procedure: CAROTID ANGIOGRAM;  Surgeon: Nada Libman, MD;  Location: [facility] CATH LAB;  Service: Cardiovascular;  Laterality: Bilateral;   CAROTID ENDARTERECTOMY Left December 03, 2014   CEA   ENDARTERECTOMY Left 12/03/2014   Procedure: ENDARTERECTOMY CAROTID WITH PATCH ANGIOPLASTY;  Surgeon: Nada Libman, MD;  Location: [facility] OR;  Service: Vascular;  Laterality: Left;   EYE SURGERY Right    cataract surgery with lens implant   Neck cyst resection     PERCUTANEOUS STENT INTERVENTION Right 05/28/2013   Procedure: PERCUTANEOUS STENT INTERVENTION;  Surgeon: Iran Ouch, MD;  Location: [facility] CATH LAB;  Service: Cardiovascular;  Laterality: Right;   VASECTOMY      Home Medications:  Allergies as of 07/08/2021   No Known Allergies      Medication List        Accurate as of July 08, 2021  9:51 AM. If you have any questions, ask your nurse or doctor.          aspirin EC 81 MG tablet Take 1 tablet (81 mg total) by mouth daily.   atorvastatin 40 MG tablet Commonly known as: LIPITOR Take 1 tablet (40 mg total) by mouth daily.   carvedilol 12.5 MG tablet Commonly known as: COREG TAKE 1 TABLET TWICE A DAY   furosemide 20 MG tablet Commonly known as: LASIX Take one tablet by mouth Mon., Wed., and Friday   nitroGLYCERIN 0.3 MG SL tablet Commonly known as: NITROSTAT Place 1 tablet (0.3 mg total) under the tongue every 5 (five) minutes as needed for chest pain.   NON FORMULARY Colloidal silver 1 oz every morning.  psyllium 58.6 % powder Commonly known as: METAMUCIL Take 1 packet by mouth as needed.   spironolactone 25 MG tablet Commonly known as: ALDACTONE TAKE 1 TABLET ON MONDAY, WEDNESDAY AND FRIDAY        Allergies: No Known Allergies  Family History: Family History  Problem Relation Age of Onset   Diabetes Mother    Diabetes Father    Diabetes Brother    COPD Brother    Breast cancer Sister 32   Diabetes Brother     Social History:  reports that he quit smoking about 8 years ago. His smoking use included cigarettes. He started smoking about 8 years ago. He has a 49.00 pack-year smoking history. He has quit using smokeless tobacco.  His smokeless tobacco use included snuff. He reports that he does not drink alcohol and does not use  drugs.  ROS: All other review of systems were reviewed and are negative except what is noted above in HPI  Physical Exam: BP (!) 148/76   Pulse 69   Constitutional:  Alert and oriented, No acute distress. HEENT:  AT, moist mucus membranes.  Trachea midline, no masses. Cardiovascular: No clubbing, cyanosis, or edema. Respiratory: Normal respiratory effort, no increased work of breathing. GI: Abdomen is soft, nontender, nondistended, no abdominal masses GU: No CVA tenderness. Circumcised phallus. No masses/lesions on penis, testis, scrotum. Prostate 40g smooth no nodules no induration.  Lymph: No cervical or inguinal lymphadenopathy. Skin: No rashes, bruises or suspicious lesions. Neurologic: Grossly intact, no focal deficits, moving all 4 extremities. Psychiatric: Normal mood and affect.  Laboratory Data: Lab Results  Component Value Date   WBC 9.3 03/22/2021   HGB 15.0 03/22/2021   HCT 44.6 03/22/2021   MCV 96 03/22/2021   PLT 183 03/22/2021    Lab Results  Component Value Date   CREATININE 1.01 03/22/2021    No results found for: PSA  No results found for: TESTOSTERONE  Lab Results  Component Value Date   HGBA1C 5.7 (H) 09/07/2012    Urinalysis    Component Value Date/Time   COLORURINE YELLOW 11/25/2014 1058   APPEARANCEUR CLEAR 11/25/2014 1058   LABSPEC 1.010 11/25/2014 1058   PHURINE 5.5 11/25/2014 1058   GLUCOSEU NEGATIVE 11/25/2014 1058   HGBUR NEGATIVE 11/25/2014 1058   BILIRUBINUR NEGATIVE 11/25/2014 1058   KETONESUR NEGATIVE 11/25/2014 1058   PROTEINUR NEGATIVE 11/25/2014 1058   UROBILINOGEN 0.2 11/25/2014 1058   NITRITE NEGATIVE 11/25/2014 1058   LEUKOCYTESUR NEGATIVE 11/25/2014 1058    Lab Results  Component Value Date   BACTERIA RARE 09/03/2012    Pertinent Imaging:  Results for orders placed in visit on 03/13/13  DG Abd 1 View  Narrative *RADIOLOGY REPORT*  Clinical Data: History of diarrhea.  ABDOMEN - 1 VIEW  Comparison:  None.  Findings: There is obesity.  There is thoracolumbar scoliosis with upper curve convexity to the right and lower curve convexity to the left.  Multilevel marginal osteophyte formation is seen representing degenerative spondylosis.  There is narrowing of some of the intervertebral disc spaces as well.  There is mild gaseous distention of several loops of small intestine.  Colonic gas is present.  Left upper quadrant bowel gas may reflect a combination of stomach and colon.  No opaque calculi are seen.  IMPRESSION: History given of diarrhea.  Mild gaseous distention of several loops of small intestine.  Colonic gas present.  Gas in left upper quadrant most likely reflects combination of stomach and colon. Air fluid levels cannot  be evaluated without erect or decubitus examination.  Chronic bony changes are detailed above.  Clinically significant discrepancy from primary report, if provided: None   Original Report Authenticated By: Onalee Hua Call  No results found for this or any previous visit.  No results found for this or any previous visit.  No results found for this or any previous visit.  No results found for this or any previous visit.  No results found for this or any previous visit.  No results found for this or any previous visit.  No results found for this or any previous visit.   Assessment & Plan:    1. Elevated PSA The patient and I talked about etiologies of elevated PSA.  We discussed the possible relationship between elevated PSA and prostate cancer, BPH, prostatitis, infection trauma and recent ejaculations.  The patient's PSA has been relatively stable over the interval and as such I recommended that we follow-up with a repeat PSA in 6 months.  If it remains elevated with a positive rising trend we will discuss prostate biopsy at his follow-up appointment.  - Urinalysis, Routine w reflex microscopic   No follow-ups on file.  Wilkie Aye,  MD  [facility] [place]

## 2021-07-08 NOTE — Progress Notes (Signed)
post void residual=91 Urological Symptom Review  Patient is experiencing the following symptoms: Frequent urination Hard to postpone urination Get up at night to urinate Leakage of urine Weak stream   Review of Systems  Gastrointestinal (upper)  : Negative for upper GI symptoms  Gastrointestinal (lower) : Negative for lower GI symptoms  Constitutional : Night Sweats  Skin: Negative for skin symptoms  Eyes: Negative for eye symptoms  Ear/Nose/Throat : Negative for Ear/Nose/Throat symptoms  Hematologic/Lymphatic: Negative for Hematologic/Lymphatic symptoms  Cardiovascular : Negative for cardiovascular symptoms  Respiratory : Negative for respiratory symptoms  Endocrine: Negative for endocrine symptoms  Musculoskeletal: Back pain Joint pain  Neurological: Negative for neurological symptoms  Psychologic: Negative for psychiatric symptoms

## 2021-08-10 ENCOUNTER — Other Ambulatory Visit: Payer: Self-pay | Admitting: Cardiology

## 2021-09-02 ENCOUNTER — Telehealth: Payer: Self-pay | Admitting: Family Medicine

## 2021-09-20 ENCOUNTER — Telehealth: Payer: Self-pay | Admitting: Family Medicine

## 2021-09-20 NOTE — Telephone Encounter (Signed)
Patient declined the Medicare Wellness Visit with [facility]   Stated he does not need to have completed with nurse.

## 2021-10-14 ENCOUNTER — Other Ambulatory Visit: Payer: Self-pay

## 2021-10-14 MED ORDER — CARVEDILOL 12.5 MG PO TABS: 12.5000 mg | ORAL_TABLET | Freq: Two times a day (BID) | ORAL | 3 refills | Status: DC

## 2021-11-21 ENCOUNTER — Ambulatory Visit (HOSPITAL_COMMUNITY)
Admission: RE | Admit: 2021-11-21 | Discharge: 2021-11-21 | Disposition: A | Discharge status: Home / Self Care | Payer: Medicare Other | Source: Ambulatory Visit | Attending: Surgery | Admitting: Surgery

## 2021-11-21 ENCOUNTER — Other Ambulatory Visit: Payer: Self-pay

## 2021-11-21 ENCOUNTER — Ambulatory Visit: Payer: Medicare Other | Admitting: Surgery

## 2021-11-21 ENCOUNTER — Encounter: Payer: Self-pay | Admitting: Surgery

## 2021-11-21 VITALS — BP 128/79 | HR 65 | Temp 98.4°F | Resp 20 | Ht 62.0 in | Wt 131.9 lb

## 2021-11-21 DIAGNOSIS — I6523 Occlusion and stenosis of bilateral carotid arteries: Secondary | ICD-10-CM

## 2021-11-21 NOTE — Progress Notes (Signed)
? ?[facility] [place] ? ?Patient name: Ronnie Harvey MRN: [id number] DOB: 02/02/1946 Sex: male ? ? ?REASON FOR VISIT:  ? ? ?Follow up ? ?HISOTRY OF PRESENT ILLNESS:  ? ?Ronnie Harvey is a 76 y.o. male who underwent left carotid endarterectomy with patch angioplasty on 12/03/2014 for asymptomatic stenosis.  Intraoperatively there was a 90% stenosis.  There were significant inflammatory changes on both the inside of the outside of the artery with very friable surfaces.  His surveillance ultrasound suggested occlusion of the left carotid which was confirmed with CT scan.  He remains asymptomatic.  Specifically, he denies numbness or weakness in either extremity.  He denies slurred speech.  He denies amaurosis fugax. ? ?The patient takes a statin for hypercholesterolemia.  His blood pressure is well controlled with an ACE inhibitor.  He quit smoking in 2012.  He takes aspirin.  Is a history of coronary artery disease, status post PCI ?PAST MEDICAL HISTORY:  ? ?Past Medical History:  ?Diagnosis Date  ? Arthritis   ? CAD (coronary artery disease) nonobstructive  ? a. 10/2012 Cath: LM 50, LAD 25p, 7m, Diag 43m, LCX 30p, 40d, OM1 60-70, OM3 30, RCA 40-38m, AM 70p, PDA nl, EF 20%. c.  Stents to RCA circumvlex  06/2017 in [place].    ? Cardiomyopathy, dilated (HCC)   ? a. 09/2012 Echo: EF 15-20%, diff HK, gr2 DD, Triv AI, Mod MR, mild TR/PR, mod-sev dil LA.  ? CHF (congestive heart failure) (HCC)   ? Diverticulitis   ? Essential hypertension 10/16/2019  ? ETOH abuse   ? Quit 1998  ? GERD (gastroesophageal reflux disease)   ? rarely (worse when he was drinking)  ? Itchy skin   ? Myocardial infarction [facility])   ? PAF (paroxysmal atrial fibrillation) (HCC)   ? PVD (peripheral vascular disease) (HCC)   ? a. 05/2013 s/p PTA/self-expanding stenting of the REIA and LCIA.  ? Retinal hole of right eye   ? Shortness of breath dyspnea   ? with exertion  ? Tobacco abuse   ? Quit  Dec 2012  ? ? ? ?FAMILY HISTORY:  ? ?Family History  ?Problem Relation Age of Onset  ? Diabetes Mother   ? Diabetes Father   ? Diabetes Brother   ? COPD Brother   ? Breast cancer Sister 42  ? Diabetes Brother   ? ? ?SOCIAL HISTORY:  ? ?Social History  ? ?Tobacco Use  ? Smoking status: Former  ?  Packs/day: 1.00  ?  Years: 49.00  ?  Pack years: 49.00  ?  Types: Cigarettes  ?  Start date: 08/30/2012  ?  Quit date: 11/24/2012  ?  Years since quitting: 8.9  ? Smokeless tobacco: Former  ?  Types: Snuff  ?Substance Use Topics  ? Alcohol use: No  ?  Alcohol/week: 0.0 standard drinks  ?  Comment: Former ETOH.  None since 1998 Prior to 1998 was a heavy drinker  ? ? ? ?ALLERGIES:  ? ?No Known Allergies ? ? ?CURRENT MEDICATIONS:  ? ?Current Outpatient Medications  ?Medication Sig Dispense Refill  ? aspirin EC 81 MG tablet Take 1 tablet (81 mg total) by mouth daily.    ? atorvastatin (LIPITOR) 40 MG tablet Take 1 tablet (40 mg total) by mouth daily. 90 tablet 3  ? carvedilol (COREG) 12.5 MG tablet Take 1 tablet (12.5 mg total) by mouth 2 (two) times daily. 180 tablet 3  ? furosemide (LASIX) 20 MG tablet TAKE  1 TABLET ON MONDAY, WEDNESDAY AND FRIDAY 45 tablet 3  ? nitroGLYCERIN (NITROSTAT) 0.3 MG SL tablet Place 1 tablet (0.3 mg total) under the tongue every 5 (five) minutes as needed for chest pain. 90 tablet 1  ? NON FORMULARY Colloidal silver 1 oz every morning.    ? psyllium (METAMUCIL) 58.6 % powder Take 1 packet by mouth as needed.     ? spironolactone (ALDACTONE) 25 MG tablet TAKE 1 TABLET ON MONDAY, WEDNESDAY AND FRIDAY 40 tablet 3  ? ?No current facility-administered medications for this visit.  ? ? ?REVIEW OF SYSTEMS:  ? ?[X]  denotes positive finding, [ ]  denotes negative finding ?Cardiac  Comments:  ?Chest pain or chest pressure:    ?Shortness of breath upon exertion:    ?Short of breath when lying flat:    ?Irregular heart rhythm:    ?    ?Vascular    ?Pain in calf, thigh, or hip brought on by ambulation:    ?Pain in  feet at night that wakes you up from your sleep:     ?Blood clot in your veins:    ?Leg swelling:     ?    ?Pulmonary    ?Oxygen at home:    ?Productive cough:     ?Wheezing:     ?    ?Neurologic    ?Sudden weakness in arms or legs:     ?Sudden numbness in arms or legs:     ?Sudden onset of difficulty speaking or slurred speech:    ?Temporary loss of vision in one eye:     ?Problems with dizziness:     ?    ?Gastrointestinal    ?Blood in stool:     ?Vomited blood:     ?    ?Genitourinary    ?Burning when urinating:     ?Blood in urine:    ?    ?Psychiatric    ?Major depression:     ?    ?Hematologic    ?Bleeding problems:    ?Problems with blood clotting too easily:    ?    ?Skin    ?Rashes or ulcers:    ?    ?Constitutional    ?Fever or chills:    ? ? ?PHYSICAL EXAM:  ? ?Vitals:  ? 11/21/21 1329 11/21/21 1331  ?BP: (!) 157/79 128/79  ?Pulse: 65   ?Resp: 20   ?Temp: 98.4 ?F (36.9 ?C)   ?SpO2: 96%   ?Weight: 131 lb 14.4 oz (59.8 kg)   ?Height: 5\' 2"  (1.575 m)   ? ? ?GENERAL: The patient is a well-nourished male, in no acute distress. The vital signs are documented above. ?CARDIAC: There is a regular rate and rhythm.  ?VASCULAR: Nonpalpable pedal pulses ?PULMONARY: Non-labored respirations ?MUSCULOSKELETAL: There are no major deformities or cyanosis. ?NEUROLOGIC: No focal weakness or paresthesias are detected. ?SKIN: There are no ulcers or rashes noted. ?PSYCHIATRIC: The patient has a normal affect. ? ?STUDIES:  ? ?I have reviewed the following studies: ? ?Right Carotid: Velocities in the right ICA are consistent with a 60-79%  ?               stenosis.  ? ?Left Carotid: Evidence consistent with a total occlusion of the left ICA.  ? ?Vertebrals:  Bilateral vertebral arteries demonstrate antegrade flow. Left  ?             vertebral artery demonstrates high resistant flow.  ?Subclavians: Normal flow hemodynamics were seen in  bilateral subclavian  ?             arteries.  ? ?MEDICAL ISSUES:  ? ?Asymptomatic right  carotid stenosis in the setting of left carotid occlusion: We discussed that I would continue to monitor his right carotid stenosis every 6 months.  If he gets to the point where it is greater than 80%, he will need a CT angiogram, as I will likely consider TCAR since he had carotid occlusion following endarterectomy. ? ? ? ?Durene Cal, IV, MD, FACS ?[facility] [place] ?Tel [phone] ?Pager [phone]  ?

## 2021-11-24 ENCOUNTER — Other Ambulatory Visit: Payer: Self-pay | Admitting: *Deleted

## 2021-11-24 DIAGNOSIS — I6523 Occlusion and stenosis of bilateral carotid arteries: Secondary | ICD-10-CM

## 2021-12-30 ENCOUNTER — Other Ambulatory Visit: Payer: Medicare HMO

## 2022-01-03 ENCOUNTER — Other Ambulatory Visit: Payer: Self-pay

## 2022-01-04 LAB — PSA, TOTAL AND FREE
PSA, Free Pct: 12.6 %
PSA, Free: 0.68 ng/mL
Prostate Specific Ag, Serum: 5.4 ng/mL — ABNORMAL HIGH (ref 0.0–4.0)

## 2022-01-06 ENCOUNTER — Ambulatory Visit: Payer: Medicare HMO | Admitting: Urology

## 2022-01-09 ENCOUNTER — Ambulatory Visit: Payer: Medicare Other | Admitting: Urology

## 2022-01-09 ENCOUNTER — Encounter: Payer: Self-pay | Admitting: Urology

## 2022-01-09 VITALS — BP 143/70 | HR 60

## 2022-01-09 DIAGNOSIS — R972 Elevated prostate specific antigen [PSA]: Secondary | ICD-10-CM | POA: Diagnosis not present

## 2022-01-09 LAB — URINALYSIS, ROUTINE W REFLEX MICROSCOPIC
Bilirubin, UA: NEGATIVE
Glucose, UA: NEGATIVE
Ketones, UA: NEGATIVE
Leukocytes,UA: NEGATIVE
Nitrite, UA: NEGATIVE
Protein,UA: NEGATIVE
RBC, UA: NEGATIVE
Specific Gravity, UA: 1.01 (ref 1.005–1.030)
Urobilinogen, Ur: 0.2 mg/dL (ref 0.2–1.0)
pH, UA: 5.5 (ref 5.0–7.5)

## 2022-01-09 NOTE — Patient Instructions (Signed)

## 2022-01-09 NOTE — Progress Notes (Signed)
? ?01/09/2022 ?11:04 AM  ? ?Ronnie Harvey ?22-Sep-1945 ?[id number] ? ?Referring provider: Gwenlyn Fudge, FNP ?[address] [place],  [place] [address] ? ?Followup elevated PSA ? ? ?HPI: ?Ronnie Harvey is a 76yo here for followup for elevated PSA. PSA 5.4 up from 5.0. IPSS 8 QOL 1 on no BPH therapy. Nocturia 1-2x. No straining to urinate. Urine stream strong. NO other complaints today ? ? ?PMH: ?Past Medical History:  ?Diagnosis Date  ? Arthritis   ? CAD (coronary artery disease) nonobstructive  ? a. 10/2012 Cath: LM 50, LAD 25p, 26m, Diag 73m, LCX 30p, 40d, OM1 60-70, OM3 30, RCA 40-44m, AM 70p, PDA nl, EF 20%. c.  Stents to RCA circumvlex  06/2017 in [place].    ? Cardiomyopathy, dilated (HCC)   ? a. 09/2012 Echo: EF 15-20%, diff HK, gr2 DD, Triv AI, Mod Ronnie, mild TR/PR, mod-sev dil LA.  ? CHF (congestive heart failure) (HCC)   ? Diverticulitis   ? Essential hypertension 10/16/2019  ? ETOH abuse   ? Quit 1998  ? GERD (gastroesophageal reflux disease)   ? rarely (worse when he was drinking)  ? Itchy skin   ? Myocardial infarction [facility])   ? PAF (paroxysmal atrial fibrillation) (HCC)   ? PVD (peripheral vascular disease) (HCC)   ? a. 05/2013 s/p PTA/self-expanding stenting of the REIA and LCIA.  ? Retinal hole of right eye   ? Shortness of breath dyspnea   ? with exertion  ? Tobacco abuse   ? Quit Dec 2012  ? ? ?Surgical History: ?Past Surgical History:  ?Procedure Laterality Date  ? ABDOMINAL AORTAGRAM N/A 05/28/2013  ? Procedure: ABDOMINAL AORTAGRAM;  Surgeon: Iran Ouch, MD;  Location: [facility] CATH LAB;  Service: Cardiovascular;  Laterality: N/A;  ? CAROTID ANGIOGRAM Bilateral 10/27/2014  ? Procedure: CAROTID ANGIOGRAM;  Surgeon: Nada Libman, MD;  Location: [facility] CATH LAB;  Service: Cardiovascular;  Laterality: Bilateral;  ? CAROTID ENDARTERECTOMY Left December 03, 2014  ? CEA  ? ENDARTERECTOMY Left 12/03/2014  ? Procedure: ENDARTERECTOMY CAROTID WITH PATCH ANGIOPLASTY;  Surgeon: Nada Libman, MD;  Location: [facility] OR;   Service: Vascular;  Laterality: Left;  ? EYE SURGERY Right   ? cataract surgery with lens implant  ? Neck cyst resection    ? PERCUTANEOUS STENT INTERVENTION Right 05/28/2013  ? Procedure: PERCUTANEOUS STENT INTERVENTION;  Surgeon: Iran Ouch, MD;  Location: [facility] CATH LAB;  Service: Cardiovascular;  Laterality: Right;  ? VASECTOMY    ? ? ?Home Medications:  ?Allergies as of 01/09/2022   ?No Known Allergies ?  ? ?  ?Medication List  ?  ? ?  ? Accurate as of January 09, 2022 11:04 AM. If you have any questions, ask your nurse or doctor.  ?  ?  ? ?  ? ?aspirin EC 81 MG tablet ?Take 1 tablet (81 mg total) by mouth daily. ?  ?atorvastatin 40 MG tablet ?Commonly known as: LIPITOR ?Take 1 tablet (40 mg total) by mouth daily. ?  ?carvedilol 12.5 MG tablet ?Commonly known as: COREG ?Take 1 tablet (12.5 mg total) by mouth 2 (two) times daily. ?  ?furosemide 20 MG tablet ?Commonly known as: LASIX ?TAKE 1 TABLET ON MONDAY, WEDNESDAY AND FRIDAY ?  ?nitroGLYCERIN 0.3 MG SL tablet ?Commonly known as: NITROSTAT ?Place 1 tablet (0.3 mg total) under the tongue every 5 (five) minutes as needed for chest pain. ?  ?NON FORMULARY ?Colloidal silver 1 oz every morning. ?  ?psyllium 58.6 %  powder ?Commonly known as: METAMUCIL ?Take 1 packet by mouth as needed. ?  ?spironolactone 25 MG tablet ?Commonly known as: ALDACTONE ?TAKE 1 TABLET ON MONDAY, WEDNESDAY AND FRIDAY ?  ? ?  ? ? ?Allergies: No Known Allergies ? ?Family History: ?Family History  ?Problem Relation Age of Onset  ? Diabetes Mother   ? Diabetes Father   ? Diabetes Brother   ? COPD Brother   ? Breast cancer Sister 30  ? Diabetes Brother   ? ? ?Social History:  reports that he quit smoking about 9 years ago. His smoking use included cigarettes. He started smoking about 9 years ago. He has a 49.00 pack-year smoking history. He has quit using smokeless tobacco.  His smokeless tobacco use included snuff. He reports that he does not drink alcohol and does not use drugs. ? ?ROS: ?All  other review of systems were reviewed and are negative except what is noted above in HPI ? ?Physical Exam: ?BP (!) 143/70   Pulse 60   ?Constitutional:  Alert and oriented, No acute distress. ?HEENT: [place] AT, moist mucus membranes.  Trachea midline, no masses. ?Cardiovascular: No clubbing, cyanosis, or edema. ?Respiratory: Normal respiratory effort, no increased work of breathing. ?GI: Abdomen is soft, nontender, nondistended, no abdominal masses ?GU: No CVA tenderness.  ?Lymph: No cervical or inguinal lymphadenopathy. ?Skin: No rashes, bruises or suspicious lesions. ?Neurologic: Grossly intact, no focal deficits, moving all 4 extremities. ?Psychiatric: Normal mood and affect. ? ?Laboratory Data: ?Lab Results  ?Component Value Date  ? WBC 9.3 03/22/2021  ? HGB 15.0 03/22/2021  ? HCT 44.6 03/22/2021  ? MCV 96 03/22/2021  ? PLT 183 03/22/2021  ? ? ?Lab Results  ?Component Value Date  ? CREATININE 1.01 03/22/2021  ? ? ?No results found for: PSA ? ?No results found for: TESTOSTERONE ? ?Lab Results  ?Component Value Date  ? HGBA1C 5.7 (H) 09/07/2012  ? ? ?Urinalysis ?   ?Component Value Date/Time  ? COLORURINE YELLOW 11/25/2014 1058  ? APPEARANCEUR Clear 07/08/2021 0944  ? LABSPEC 1.010 11/25/2014 1058  ? PHURINE 5.5 11/25/2014 1058  ? GLUCOSEU Negative 07/08/2021 0944  ? HGBUR NEGATIVE 11/25/2014 1058  ? BILIRUBINUR Negative 07/08/2021 0944  ? KETONESUR NEGATIVE 11/25/2014 1058  ? PROTEINUR Negative 07/08/2021 0944  ? PROTEINUR NEGATIVE 11/25/2014 1058  ? UROBILINOGEN 0.2 11/25/2014 1058  ? NITRITE Negative 07/08/2021 0944  ? NITRITE NEGATIVE 11/25/2014 1058  ? LEUKOCYTESUR Negative 07/08/2021 0944  ? ? ?Lab Results  ?Component Value Date  ? LABMICR See below: 07/08/2021  ? WBCUA None seen 07/08/2021  ? LABEPIT None seen 07/08/2021  ? BACTERIA None seen 07/08/2021  ? ? ?Pertinent Imaging: ? ?Results for orders placed in visit on 03/13/13 ? ?DG Abd 1 View ? ?Narrative ?*RADIOLOGY REPORT* ? ?Clinical Data: History of  diarrhea. ? ?ABDOMEN - 1 VIEW ? ?Comparison: None. ? ?Findings: There is obesity.  There is thoracolumbar scoliosis with ?upper curve convexity to the right and lower curve convexity to the ?left.  Multilevel marginal osteophyte formation is seen ?representing degenerative spondylosis.  There is narrowing of some ?of the intervertebral disc spaces as well. ? ?There is mild gaseous distention of several loops of small ?intestine.  Colonic gas is present.  Left upper quadrant bowel gas ?may reflect a combination of stomach and colon.  No opaque calculi ?are seen. ? ?IMPRESSION: ?History given of diarrhea.  Mild gaseous distention of several ?loops of small intestine.  Colonic gas present.  Gas in left upper ?  quadrant most likely reflects combination of stomach and colon. ?Air fluid levels cannot be evaluated without erect or decubitus ?examination. ? ?Chronic bony changes are detailed above. ? ?Clinically significant discrepancy from primary report, if ?provided: None ? ? ?Original Report Authenticated By: Onalee Hua Call ? ?No results found for this or any previous visit. ? ?No results found for this or any previous visit. ? ?No results found for this or any previous visit. ? ?No results found for this or any previous visit. ? ?No results found for this or any previous visit. ? ?No results found for this or any previous visit. ? ?No results found for this or any previous visit. ? ? ?Assessment & Plan:   ? ?1. Elevated PSA ?-RTC 6 months with PSA ?- Urinalysis, Routine w reflex microscopic ? ? ?No follow-ups on file. ? ?Wilkie Aye, MD ? ?[facility] [place] ?  ?

## 2022-01-19 ENCOUNTER — Other Ambulatory Visit: Payer: Self-pay | Admitting: Cardiology

## 2022-03-05 ENCOUNTER — Other Ambulatory Visit: Payer: Self-pay | Admitting: Family Medicine

## 2022-03-05 DIAGNOSIS — E782 Mixed hyperlipidemia: Secondary | ICD-10-CM

## 2022-03-05 DIAGNOSIS — I2510 Atherosclerotic heart disease of native coronary artery without angina pectoris: Secondary | ICD-10-CM

## 2022-03-05 DIAGNOSIS — I739 Peripheral vascular disease, unspecified: Secondary | ICD-10-CM

## 2022-03-05 DIAGNOSIS — I6523 Occlusion and stenosis of bilateral carotid arteries: Secondary | ICD-10-CM

## 2022-03-29 ENCOUNTER — Other Ambulatory Visit: Payer: Self-pay | Admitting: Cardiology

## 2022-04-30 ENCOUNTER — Other Ambulatory Visit: Payer: Self-pay | Admitting: Family Medicine

## 2022-04-30 DIAGNOSIS — I2510 Atherosclerotic heart disease of native coronary artery without angina pectoris: Secondary | ICD-10-CM

## 2022-04-30 DIAGNOSIS — E782 Mixed hyperlipidemia: Secondary | ICD-10-CM

## 2022-04-30 DIAGNOSIS — I6523 Occlusion and stenosis of bilateral carotid arteries: Secondary | ICD-10-CM

## 2022-04-30 DIAGNOSIS — I739 Peripheral vascular disease, unspecified: Secondary | ICD-10-CM

## 2022-05-18 ENCOUNTER — Encounter: Payer: Self-pay | Admitting: Family Medicine

## 2022-05-18 ENCOUNTER — Ambulatory Visit (INDEPENDENT_AMBULATORY_CARE_PROVIDER_SITE_OTHER): Payer: Medicare Other | Admitting: Family Medicine

## 2022-05-18 VITALS — BP 118/75 | HR 60 | Temp 97.4°F | Ht 62.0 in | Wt 135.4 lb

## 2022-05-18 DIAGNOSIS — Z87891 Personal history of nicotine dependence: Secondary | ICD-10-CM

## 2022-05-18 DIAGNOSIS — M1990 Unspecified osteoarthritis, unspecified site: Secondary | ICD-10-CM | POA: Diagnosis not present

## 2022-05-18 DIAGNOSIS — Z0001 Encounter for general adult medical examination with abnormal findings: Secondary | ICD-10-CM

## 2022-05-18 DIAGNOSIS — J302 Other seasonal allergic rhinitis: Secondary | ICD-10-CM | POA: Diagnosis not present

## 2022-05-18 DIAGNOSIS — Z0000 Encounter for general adult medical examination without abnormal findings: Secondary | ICD-10-CM

## 2022-05-18 NOTE — Patient Instructions (Addendum)
Tylenol 1,000 mg three times daily as needed.  Voltaren gel for arthritis pains.

## 2022-05-18 NOTE — Progress Notes (Signed)
Assessment & Plan:  Well adult exam Discussed health benefits of physical activity, and encouraged him to engage in regular exercise appropriate for his age and condition. Preventive health education provided. Declined COVID booster.   Immunization History  Administered Date(s) Administered   Influenza,inj,Quad PF,6+ Mos 09/02/2013   PFIZER(Purple Top)SARS-COV-2 Vaccination 12/18/2019, 01/09/2020, 08/23/2020   Pneumococcal Conjugate-13 10/16/2019   Pneumococcal Polysaccharide-23 09/07/2012   Tdap 10/16/2019   Zoster Recombinat (Shingrix) 07/16/2018, 09/16/2018   Health Maintenance  Topic Date Due   COVID-19 Vaccine (4 - Pfizer series) 06/03/2022 (Originally 10/18/2020)   INFLUENZA VACCINE  12/17/2022 (Originally 04/18/2022)   TETANUS/TDAP  10/15/2029   Pneumonia Vaccine 76+ Years old  Completed   Hepatitis C Screening  Completed   Zoster Vaccines- Shingrix  Completed   HPV VACCINES  Aged Out   - CBC with Differential/Platelet - CMP14+EGFR - Lipid panel  2. Seasonal allergies Encouraged daily antihistamine.  3. Arthritis Recommended Tylenol and Voltaren gel.  4. History of tobacco use - CT CHEST LUNG CA SCREEN LOW DOSE W/O CM; Future   Follow-up: Return in about 1 year (around 05/19/2023) for annual physical.   [name], MSN, APRN, FNP-C [name]  Subjective:  Patient ID: Ronnie Harvey, male    DOB: 25-Oct-1945  Age: 76 y.o. MRN: [id number]  Patient Care Team: Loman Brooklyn, FNP as PCP - General (Family Medicine) Minus Breeding, MD as Consulting Physician (Cardiology) Chevis Pretty, FNP as Nurse Practitioner (Family Medicine) [name], MD as Consulting Physician (Cardiology)   CC:  Chief Complaint  Patient presents with   Annual Exam    HPI Ronnie Harvey is a 76 y.o. male who presents today for a complete physical exam. He reports consuming a general diet. The patient does not participate in regular exercise  at present. He generally feels well. He reports sleeping well. He does have additional problems to discuss today.   Vision:Within last year Dental:Receives regular dental care  Lung Cancer Screening with low-dose Chest CT: never, but agreeable  Advanced Directives Patient does not have advanced directives including DNR, living will, healthcare power of attorney, financial power of attorney, and MOST form.   DEPRESSION SCREENING    05/18/2022    1:03 PM 03/22/2021   11:04 AM 04/14/2020   10:09 AM 10/16/2019    1:09 PM 02/18/2016    3:48 PM 12/18/2014    1:41 PM  PHQ 2/9 Scores  PHQ - 2 Score 0 0 0 0 0 0  PHQ- 9 Score 1 0        Elevated PSA managed by urology. Upcoming appointment scheduled for 07/10/2022.  Carotid stenosis and atherosclerosis managed by vascular. Upcoming appointment scheduled for 05/29/2022.  Ischemic cardiomyopathy, PAF, CAD, hypertension, and hyperlipidemia managed by cardiology. Upcoming appointment scheduled for 05/31/2022.   Review of Systems  Constitutional:  Negative for chills, fever, malaise/fatigue and weight loss.  HENT:  Negative for congestion, ear discharge, ear pain, nosebleeds, sinus pain, sore throat and tinnitus.   Eyes:  Negative for blurred vision, double vision, pain, discharge and redness.       + itchy, watery eyes  Respiratory:  Positive for cough (occasional occuring most of this summer; clear or yellowish thick sputum). Negative for shortness of breath and wheezing.   Cardiovascular:  Negative for chest pain, palpitations and leg swelling.  Gastrointestinal:  Negative for abdominal pain, constipation, diarrhea, heartburn, nausea and vomiting.  Genitourinary:  Negative for dysuria, frequency and urgency.  Musculoskeletal:  Positive for back pain and joint pain (right hip that started this summer). Negative for myalgias.  Skin:  Negative for rash.  Neurological:  Negative for dizziness, seizures, weakness and headaches.  Psychiatric/Behavioral:   Negative for depression, substance abuse and suicidal ideas. The patient is not nervous/anxious.      Current Outpatient Medications:    aspirin EC 81 MG tablet, Take 1 tablet (81 mg total) by mouth daily., Disp: , Rfl:    atorvastatin (LIPITOR) 40 MG tablet, TAKE 1 TABLET BY MOUTH DAILY, Disp: 90 tablet, Rfl: 0   carvedilol (COREG) 12.5 MG tablet, Take 1 tablet (12.5 mg total) by mouth 2 (two) times daily., Disp: 180 tablet, Rfl: 3   furosemide (LASIX) 20 MG tablet, TAKE 1 TABLET ON MONDAY,  WEDNESDAY AND FRIDAY, Disp: 39 tablet, Rfl: 3   nitroGLYCERIN (NITROSTAT) 0.3 MG SL tablet, Place 1 tablet (0.3 mg total) under the tongue every 5 (five) minutes as needed for chest pain., Disp: 90 tablet, Rfl: 1   NON FORMULARY, Colloidal silver 1 oz every morning., Disp: , Rfl:    spironolactone (ALDACTONE) 25 MG tablet, TAKE 1 TABLET ON MONDAY,  WEDNESDAY AND FRIDAY, Disp: 39 tablet, Rfl: 3  No Known Allergies  Past Medical History:  Diagnosis Date   Arthritis    CAD (coronary artery disease) nonobstructive   a. 10/2012 Cath: LM 50, LAD 25p, 20m Diag 281mLCX 30p, 40d, OM1 60-70, OM3 30, RCA 40-5068mM 70p, PDA nl, EF 20%. c.  [place] RCA circum[place]lex  [place]   [place]  a. 09/2012 Echo: EF 15-20%, diff HK, gr2 DD, Triv AI, Mod MR, mild TR/PR, mod-sev dil LA.   CHF (congestive heart failure) ([place]  Diverticulitis    Essential hypertension 10/16/2019   ETOH abuse    Quit 1998   GERD (gastroesophageal reflux disease)    rarely (worse when he was drinking)   Itchy skin    Myocardial infarction ([facility]  PAF (paroxysmal atrial fibrillation) ([facility]  PVD (peripheral vascular disease) ([place]  a. 05/2013 s/p PTA/self-expanding stenting of the REIA and LCIA.   Retinal hole of right eye    Shortness of breath dyspnea    with exertion   Tobacco abuse    Quit Dec 2012    Past Surgical History:  Procedure Laterality Date   ABDOMINAL AORTAGRAM N/A  05/28/2013   Procedure: ABDOMINAL AORTAGRAM;  Surgeon: MuhWellington HampshireD;  [facility];  Service: Cardiovascular;  Laterality: N/A;   CAROTID ANGIOGRAM Bilateral 10/27/2014   Procedure: CAROTID ANGIOGRAM;  Surgeon: VanSerafina MitchellD;  [facility];  Service: Cardiovascular;  Laterality: Bilateral;   CAROTID ENDARTERECTOMY Left December 03, 2014   CEA   ENDARTERECTOMY Left 12/03/2014   Procedure: ENDARTERECTOMY CAROTID WITH PATCH ANGIOPLASTY;  Surgeon: VanSerafina MitchellD;  [facility];  Service: Vascular;  Laterality: Left;   EYE SURGERY Right    cataract surgery with lens implant   Neck cyst resection     PERCUTANEOUS STENT INTERVENTION Right 05/28/2013   Procedure: PERCUTANEOUS STENT INTERVENTION;  Surgeon: MuhWellington HampshireD;  [facility] [place];  Service: Cardiovascular;  Laterality: Right;   VASECTOMY      Family History  Problem Relation Age of Onset   Diabetes Mother    Diabetes Father    Diabetes Brother    COPD Brother    Breast cancer  Sister 25   Diabetes Brother     Social History   Socioeconomic History   Marital status: Widowed    Spouse name: Not on file   Number of children: Not on file   Years of education: Not on file   Highest education level: Not on file  Occupational History   Occupation: Retired  Tobacco Use   Smoking status: Former    Packs/day: 1.00    Years: 49.00    Total pack years: 49.00    Types: Cigarettes    Start date: 08/30/2012    Quit date: 11/24/2012    Years since quitting: 9.4   Smokeless tobacco: Former    Types: Snuff  Substance and Sexual Activity   Alcohol use: No    Alcohol/week: 0.0 standard drinks of alcohol    Comment: Former ETOH.  None since 1998 Prior to 1998 was a heavy drinker   Drug use: No   Sexual activity: Not on file  Other Topics Concern   Not on file  Social History Narrative   Not on file   Social Determinants of Health   Financial Resource Strain: Not on file  Food  Insecurity: Not on file  Transportation Needs: Not on file  Physical Activity: Not on file  Stress: Not on file  Social Connections: Not on file  Intimate Partner Violence: Not on file      Objective:    BP 118/75   Pulse 60   Temp (!) 97.4 F (36.3 C) (Temporal)   Ht 5' 2" (1.575 m)   Wt 135 lb 6.4 oz (61.4 kg)   SpO2 93%   BMI 24.76 kg/m   BP Readings from Last 3 Encounters:  05/18/22 118/75  01/09/22 (!) 143/70  11/21/21 128/79   Wt Readings from Last 3 Encounters:  05/18/22 135 lb 6.4 oz (61.4 kg)  11/21/21 131 lb 14.4 oz (59.8 kg)  04/27/21 137 lb (62.1 kg)    Physical Exam Vitals reviewed.  Constitutional:      General: He is not in acute distress.    Appearance: Normal appearance. He is not ill-appearing, toxic-appearing or diaphoretic.  HENT:     Head: Normocephalic and atraumatic.     Right Ear: Tympanic membrane, ear canal and external ear normal. There is no impacted cerumen.     Left Ear: Tympanic membrane, ear canal and external ear normal. There is no impacted cerumen.     Nose: Nose normal. No congestion or rhinorrhea.     Mouth/Throat:     Mouth: Mucous membranes are moist.     Pharynx: Oropharynx is clear. No oropharyngeal exudate or posterior oropharyngeal erythema.  Eyes:     General: No scleral icterus.       Right eye: No discharge.        Left eye: No discharge.     Conjunctiva/sclera: Conjunctivae normal.     Pupils: Pupils are equal, round, and reactive to light.  Neck:     Vascular: No carotid bruit.  Cardiovascular:     Rate and Rhythm: Normal rate and regular rhythm.     Heart sounds: Normal heart sounds. No murmur heard.    No friction rub. No gallop.  Pulmonary:     Effort: Pulmonary effort is normal. No respiratory distress.     Breath sounds: Normal breath sounds. No stridor. No wheezing, rhonchi or rales.  Abdominal:     General: Abdomen is flat. Bowel sounds are normal. There is no distension.  Palpations: Abdomen is  soft. There is no hepatomegaly, splenomegaly or mass.     Tenderness: There is no abdominal tenderness. There is no guarding or rebound.     Hernia: No hernia is present.  Musculoskeletal:        General: Normal range of motion.     Cervical back: Normal range of motion and neck supple. No rigidity. No muscular tenderness.     Right lower leg: No edema.     Left lower leg: No edema.  Lymphadenopathy:     Cervical: No cervical adenopathy.  Skin:    General: Skin is warm and dry.     Capillary Refill: Capillary refill takes less than 2 seconds.  Neurological:     General: No focal deficit present.     Mental Status: He is alert and oriented to person, place, and time. Mental status is at baseline.  Psychiatric:        Mood and Affect: Mood normal.        Behavior: Behavior normal.        Thought Content: Thought content normal.        Judgment: Judgment normal.     Lab Results  Component Value Date   TSH 1.998 09/04/2012   Lab Results  Component Value Date   WBC 9.3 03/22/2021   HGB 15.0 03/22/2021   HCT 44.6 03/22/2021   MCV 96 03/22/2021   PLT 183 03/22/2021   Lab Results  Component Value Date   NA 139 03/22/2021   K 4.5 03/22/2021   CO2 23 03/22/2021   GLUCOSE 99 03/22/2021   BUN 20 03/22/2021   CREATININE 1.01 03/22/2021   BILITOT 0.8 03/22/2021   ALKPHOS 116 03/22/2021   AST 17 03/22/2021   ALT 10 03/22/2021   PROT 7.2 03/22/2021   ALBUMIN 4.3 03/22/2021   CALCIUM 9.1 03/22/2021   ANIONGAP 7 12/04/2014   EGFR 77 03/22/2021   GFR 53.38 (L) 02/17/2014   Lab Results  Component Value Date   CHOL 112 03/22/2021   Lab Results  Component Value Date   HDL 29 (L) 03/22/2021   Lab Results  Component Value Date   LDLCALC 64 03/22/2021   Lab Results  Component Value Date   TRIG 102 03/22/2021   Lab Results  Component Value Date   CHOLHDL 3.9 03/22/2021   Lab Results  Component Value Date   HGBA1C 5.7 (H) 09/07/2012

## 2022-05-19 LAB — CMP14+EGFR
ALT: 10 IU/L (ref 0–44)
AST: 16 IU/L (ref 0–40)
Albumin/Globulin Ratio: 1.3 (ref 1.2–2.2)
Albumin: 4.3 g/dL (ref 3.8–4.8)
Alkaline Phosphatase: 124 IU/L — ABNORMAL HIGH (ref 44–121)
BUN/Creatinine Ratio: 20 (ref 10–24)
BUN: 21 mg/dL (ref 8–27)
Bilirubin Total: 0.8 mg/dL (ref 0.0–1.2)
CO2: 25 mmol/L (ref 20–29)
Calcium: 9.5 mg/dL (ref 8.6–10.2)
Chloride: 102 mmol/L (ref 96–106)
Creatinine, Ser: 1.03 mg/dL (ref 0.76–1.27)
Globulin, Total: 3.2 g/dL (ref 1.5–4.5)
Glucose: 92 mg/dL (ref 70–99)
Potassium: 4.7 mmol/L (ref 3.5–5.2)
Sodium: 141 mmol/L (ref 134–144)
Total Protein: 7.5 g/dL (ref 6.0–8.5)
eGFR: 75 mL/min/{1.73_m2} (ref >59)

## 2022-05-19 LAB — CBC WITH DIFFERENTIAL/PLATELET
Basophils Absolute: 0 10*3/uL (ref 0.0–0.2)
Basos: 0 %
EOS (ABSOLUTE): 0.3 10*3/uL (ref 0.0–0.4)
Eos: 3 %
Hematocrit: 43.9 % (ref 37.5–51.0)
Hemoglobin: 14.9 g/dL (ref 13.0–17.7)
Immature Grans (Abs): 0 10*3/uL (ref 0.0–0.1)
Immature Granulocytes: 0 %
Lymphocytes Absolute: 2.3 10*3/uL (ref 0.7–3.1)
Lymphs: 24 %
MCH: 32.1 pg (ref 26.6–33.0)
MCHC: 33.9 g/dL (ref 31.5–35.7)
MCV: 95 fL (ref 79–97)
Monocytes Absolute: 0.8 10*3/uL (ref 0.1–0.9)
Monocytes: 9 %
Neutrophils Absolute: 6.2 10*3/uL (ref 1.4–7.0)
Neutrophils: 64 %
Platelets: 206 10*3/uL (ref 150–450)
RBC: 4.64 x10E6/uL (ref 4.14–5.80)
RDW: 12 % (ref 11.6–15.4)
WBC: 9.8 10*3/uL (ref 3.4–10.8)

## 2022-05-19 LAB — LIPID PANEL
Chol/HDL Ratio: 3.3 ratio (ref 0.0–5.0)
Cholesterol, Total: 102 mg/dL (ref 100–199)
HDL: 31 mg/dL — ABNORMAL LOW (ref >39)
LDL Chol Calc (NIH): 54 mg/dL (ref 0–99)
Triglycerides: 87 mg/dL (ref 0–149)
VLDL Cholesterol Cal: 17 mg/dL (ref 5–40)

## 2022-05-29 ENCOUNTER — Ambulatory Visit: Payer: Medicare Other | Admitting: Physician Assistant

## 2022-05-29 ENCOUNTER — Ambulatory Visit (HOSPITAL_COMMUNITY)
Admission: RE | Admit: 2022-05-29 | Discharge: 2022-05-29 | Disposition: A | Discharge status: Home / Self Care | Payer: Medicare Other | Source: Ambulatory Visit | Attending: Vascular Surgery | Admitting: Vascular Surgery

## 2022-05-29 VITALS — BP 122/68 | HR 70 | Temp 97.5°F | Resp 16 | Ht 62.0 in | Wt 132.0 lb

## 2022-05-29 DIAGNOSIS — I6523 Occlusion and stenosis of bilateral carotid arteries: Secondary | ICD-10-CM

## 2022-05-29 NOTE — Progress Notes (Unsigned)
Cardiology Office Note   Date:  05/31/2022   ID:  LYRIK BURESH, DOB [birth year], MRN [id number]  PCP:  Gwenlyn Fudge, FNP  Cardiologist:   None Referring:  Gwenlyn Fudge, FNP  Chief Complaint  Patient presents with   Cardiomyopathy       History of Present Illness: Ronnie Harvey is a 76 y.o. male who presents for followup of cardiomyopathy. He was seen in consultation by [place] at the time of diverticulitis and bowel microperforation a few years ago. He had atrial fibrillation as part of this hospitalization. He had respiratory failure and required intubation. His ejection fraction was 20%.  He was not anticoagulated at that time because of his ongoing acute issues. Cardiac catheterization demonstrated left main 50% stenosis, LAD 40% stenosis, circumflex 30% followed by distal 40% stenosis, small obtuse marginal 60-70% stenosis and right coronary artery 40-50% stenosis. The EF was 20%.  He has also had PTA and stenting of his right iliac and left common iliac by Dr. Kirke Corin.  I did repeat an echo in 2015 and the EF was about 30% and was up to 40 - 45% in March 2017.  He is status post CEA.  However, follow up angiography demonstrates this to now be occluded on the left.  He has only mild disease on the right with some subclavian disease.    Since I last saw him he he has had no new cardiovascular complaints.  He rarely feels palpitations.  He is not having any presyncope or syncope.  He denies chest pressure, neck or arm discomfort.  He has no new shortness of breath, PND or orthopnea.  He had no weight gain or edema.  He does not do a lot because he does not feel like it although he says occasionally he can split what he needs to.   Past Medical History:  Diagnosis Date   Arthritis    CAD (coronary artery disease) nonobstructive   a. 10/2012 Cath: LM 50, LAD 25p, 51m, Diag 52m, LCX 30p, 40d, OM1 60-70, OM3 30, RCA 40-45m, AM 70p, PDA nl, EF 20%. c.  Stents to RCA circumvlex   06/2017 in [place].     Cardiomyopathy, dilated (HCC)    a. 09/2012 Echo: EF 15-20%, diff HK, gr2 DD, Triv AI, Mod MR, mild TR/PR, mod-sev dil LA.   CHF (congestive heart failure) (HCC)    Diverticulitis    Essential hypertension 10/16/2019   ETOH abuse    Quit 1998   GERD (gastroesophageal reflux disease)    rarely (worse when he was drinking)   Itchy skin    Myocardial infarction (HCC)    PAF (paroxysmal atrial fibrillation) (HCC)    PVD (peripheral vascular disease) (HCC)    a. 05/2013 s/p PTA/self-expanding stenting of the REIA and LCIA.   Retinal hole of right eye    Shortness of breath dyspnea    with exertion   Tobacco abuse    Quit Dec 2012    Past Surgical History:  Procedure Laterality Date   ABDOMINAL AORTAGRAM N/A 05/28/2013   Procedure: ABDOMINAL AORTAGRAM;  Surgeon: Iran Ouch, MD;  [facility];  Service: Cardiovascular;  Laterality: N/A;   CAROTID ANGIOGRAM Bilateral 10/27/2014   Procedure: CAROTID ANGIOGRAM;  Surgeon: Nada Libman, MD;  [facility] LAB;  Service: Cardiovascular;  Laterality: Bilateral;   CAROTID ENDARTERECTOMY Left December 03, 2014   CEA   ENDARTERECTOMY Left 12/03/2014   Procedure: ENDARTERECTOMY  CAROTID WITH PATCH ANGIOPLASTY;  Surgeon: Nada Libman, MD;  Location[facility];  Service: Vascular;  Laterality: Left;   EYE SURGERY Right    cataract surgery with lens implant   Neck cyst resection     PERCUTANEOUS STENT INTERVENTION Right 05/28/2013   Procedure: PERCUTANEOUS STENT INTERVENTION;  Surgeon: Iran Ouch, MD;  [facility];  Service: Cardiovascular;  Laterality: Right;   VASECTOMY       Current Outpatient Medications  Medication Sig Dispense Refill   aspirin EC 81 MG tablet Take 1 tablet (81 mg total) by mouth daily.     atorvastatin (LIPITOR) 40 MG tablet TAKE 1 TABLET BY MOUTH DAILY 90 tablet 0   carvedilol (COREG) 12.5 MG tablet Take 1 tablet (12.5 mg total) by mouth 2 (two) times daily. 180 tablet  3   furosemide (LASIX) 20 MG tablet TAKE 1 TABLET ON MONDAY,  WEDNESDAY AND FRIDAY 39 tablet 3   nitroGLYCERIN (NITROSTAT) 0.4 MG SL tablet Place 1 tablet (0.4 mg total) under the tongue every 5 (five) minutes as needed for chest pain. 25 tablet 4   NON FORMULARY Colloidal silver 1 oz every morning.     spironolactone (ALDACTONE) 25 MG tablet TAKE 1 TABLET ON MONDAY,  WEDNESDAY AND FRIDAY 39 tablet 3   No current facility-administered medications for this visit.    Allergies:   Patient has no known allergies.   ROS:  Please see the history of present illness.   Otherwise, review of systems are positive for none.   All other systems are reviewed and negative.    PHYSICAL EXAM: VS:  BP 128/76   Pulse 64   Ht 5\' 2"  (1.575 m)   Wt 134 lb (60.8 kg)   BMI 24.51 kg/m  , BMI Body mass index is 24.51 kg/m. GENERAL:  Well appearing NECK:  No jugular venous distention, waveform within normal limits, carotid upstroke brisk and symmetric, no bruits, no thyromegaly LUNGS:  Clear to auscultation bilaterally CHEST:  Unremarkable HEART:  PMI not displaced or sustained,S1 and S2 within normal limits, no S3, no S4, no clicks, no rubs, no murmurs ABD:  Flat, positive bowel sounds normal in frequency in pitch, no bruits, no rebound, no guarding, no midline pulsatile mass, no hepatomegaly, no splenomegaly EXT:  2 plus pulses upper and diminished dorsalis pedis and posterior tibialis bilateral lower, no edema, no cyanosis no clubbing   EKG:  EKG is  ordered today. The ekg ordered today demonstrates sinus rhythm, rate 65, axis within normal limits, intervals within normal limits, no acute ST-T wave changes, ventricular bigeminy.    Recent Labs: 05/18/2022: ALT 10; BUN 21; Creatinine, Ser 1.03; Hemoglobin 14.9; Platelets 206; Potassium 4.7; Sodium 141    Lipid Panel    Component Value Date/Time   CHOL 102 05/18/2022 1336   TRIG 87 05/18/2022 1336   HDL 31 (L) 05/18/2022 1336   CHOLHDL 3.3  05/18/2022 1336   CHOLHDL 4 09/02/2013 0853   VLDL 41.2 (H) 09/02/2013 0853   LDLCALC 54 05/18/2022 1336   LDLDIRECT 74.9 09/02/2013 0853      Wt Readings from Last 3 Encounters:  05/31/22 134 lb (60.8 kg)  05/29/22 132 lb (59.9 kg)  05/18/22 135 lb 6.4 oz (61.4 kg)      Other studies Reviewed: Additional studies/ records that were reviewed today include: Labs Review of the above records demonstrates:   See elsewhere  ASSESSMENT AND PLAN:  Cardiomyopathy I will repeat an echo as  it has been years since I checked this for now he will continue the meds as listed.  Atrial fibrillation He is not having any symptomatic paroxysms since he stopped drinking alcohol.  No change in therapy.    CAD The patient has no new sypmtoms.  No further cardiovascular testing is indicated.  We will continue with aggressive risk reduction and meds as listed.  HTN The blood pressure is at target.  No change in therapy.   Carotid stenosis He is status post CEA.  This is now occluded.   This is on the left.  The right was 60 to 79% stenosed the other day on Doppler and he is followed by Dr.Brabham.   Dyslipidemia:  LDL was 54 with an HDL of 31.   Current medicines are reviewed at length with the patient today.  The patient does not have concerns regarding medicines.  The following changes have been made:  None  Labs/ tests ordered today include:  None   Orders Placed This Encounter  Procedures   EKG 12-Lead   ECHOCARDIOGRAM COMPLETE      Disposition:   FU with me in 12 months.     Signed, [name], MD  05/31/2022 1:55 PM    [facility]

## 2022-05-29 NOTE — Progress Notes (Signed)
Established Carotid Patient   History of Present Illness   Ronnie Harvey is a 76 y.o. ([birth year]) male here for follow up of bilateral carotid artery stenosis.  He underwent left carotid endarterectomy with patch angioplasty on 12/03/2014 for asymptomatic stenosis.  Intraoperatively there was 90% stenosis with significant inflammatory changes both externally and internally.  His surveillance ultrasound suggested occlusion of the left carotid which was confirmed with CT scan.   He was last seen by our office on 11/21/2021 and was doing well at that time.  He was asymptomatic.  His studies showed LICA occlusion and RICA 60-79% stenosis.  He denies any strokelike symptoms.  He denies amaurosis fugax or monocular blindness.  He denies facial drooping, hemiplegia, receptive or expressive aphasia.  Current Outpatient Medications  Medication Sig Dispense Refill   aspirin EC 81 MG tablet Take 1 tablet (81 mg total) by mouth daily.     atorvastatin (LIPITOR) 40 MG tablet TAKE 1 TABLET BY MOUTH DAILY 90 tablet 0   carvedilol (COREG) 12.5 MG tablet Take 1 tablet (12.5 mg total) by mouth 2 (two) times daily. 180 tablet 3   furosemide (LASIX) 20 MG tablet TAKE 1 TABLET ON MONDAY,  WEDNESDAY AND FRIDAY 39 tablet 3   nitroGLYCERIN (NITROSTAT) 0.3 MG SL tablet Place 1 tablet (0.3 mg total) under the tongue every 5 (five) minutes as needed for chest pain. 90 tablet 1   NON FORMULARY Colloidal silver 1 oz every morning.     spironolactone (ALDACTONE) 25 MG tablet TAKE 1 TABLET ON MONDAY,  WEDNESDAY AND FRIDAY 39 tablet 3   No current facility-administered medications for this visit.    REVIEW OF SYSTEMS (negative unless checked):   Cardiac:  []  Chest pain or chest pressure? []  Shortness of breath upon activity? []  Shortness of breath when lying flat? []  Irregular heart rhythm?  Vascular:  []  Pain in calf, thigh, or hip brought on by walking? []  Pain in feet at night that wakes you up from your  sleep? []  Blood clot in your veins? []  Leg swelling?  Pulmonary:  []  Oxygen at home? []  Productive cough? []  Wheezing?  Neurologic:  []  Sudden weakness in arms or legs? []  Sudden numbness in arms or legs? []  Sudden onset of difficult speaking or slurred speech? []  Temporary loss of vision in one eye? []  Problems with dizziness?  Gastrointestinal:  []  Blood in stool? []  Vomited blood?  Genitourinary:  []  Burning when urinating? []  Blood in urine?  Psychiatric:  []  Major depression  Hematologic:  []  Bleeding problems? []  Problems with blood clotting?  Dermatologic:  []  Rashes or ulcers?  Constitutional:  []  Fever or chills?  Ear/Nose/Throat:  []  Change in hearing? []  Nose bleeds? []  Sore throat?  Musculoskeletal:  []  Back pain? []  Joint pain? []  Muscle pain?   Physical Examination   Vitals:   05/29/22 1057 05/29/22 1105  BP: (!) 144/86 122/68  Pulse: 70 70  Resp: 16   Temp: (!) 97.5 F (36.4 C)   TempSrc: Temporal   SpO2: 95%   Weight: 132 lb (59.9 kg)   Height: 5\' 2"  (1.575 m)    Body mass index is 24.14 kg/m.  General:  WDWN in NAD; vital signs documented above Gait: Not observed HENT: WNL, normocephalic Pulmonary: normal non-labored breathing , without Rales, rhonchi,  wheezing Cardiac: regular HR, without murmurs without carotid bruit Abdomen: soft, NT, no masses Skin: without rashes Vascular Exam/Pulses: Radial pulses bilaterally 2+ Extremities: without ischemic changes,  without Gangrene , without cellulitis; without open wounds; bilateral feet warm with nonpalpable pulses Musculoskeletal: no muscle wasting or atrophy  Neurologic: A&O X 3;  No focal weakness or paresthesias are detected Psychiatric:  The pt has Normal affect.  Non-Invasive Vascular Imaging   B Carotid Duplex (05/29/2022):  R ICA stenosis:  60-79% R VA:  patent and antegrade L ICA stenosis:  Occluded L VA: patent and high resistant   Medical Decision Making    Ronnie Harvey is a 76 y.o. male who presents with: asx RICA 60 to 79% stenosis and LICA occlusion  Based off the patient's carotid ultrasound today, he still has an occluded left internal carotid artery.  His right internal carotid artery stenosis is unchanged at 60 to 79% stenosis.  He is still asymptomatic. He understands if his right internal carotid artery stenosis progresses to greater than 80% he will likely need a CT angiogram and consideration for TCAR Continue aspirin and Lipitor He can follow-up with our office in 6 months with bilateral carotid artery duplex.  He understands to come back sooner if he starts developing any strokelike symptoms   Loel Dubonnet PA-C [facility] Office: [phone]  Clinic MD: Myra Gianotti

## 2022-05-31 ENCOUNTER — Ambulatory Visit: Payer: Medicare Other | Admitting: Cardiology

## 2022-05-31 ENCOUNTER — Encounter: Payer: Self-pay | Admitting: Cardiology

## 2022-05-31 VITALS — BP 128/76 | HR 64 | Ht 62.0 in | Wt 134.0 lb

## 2022-05-31 DIAGNOSIS — I2510 Atherosclerotic heart disease of native coronary artery without angina pectoris: Secondary | ICD-10-CM | POA: Diagnosis not present

## 2022-05-31 DIAGNOSIS — I480 Paroxysmal atrial fibrillation: Secondary | ICD-10-CM

## 2022-05-31 DIAGNOSIS — I6523 Occlusion and stenosis of bilateral carotid arteries: Secondary | ICD-10-CM | POA: Diagnosis not present

## 2022-05-31 DIAGNOSIS — I255 Ischemic cardiomyopathy: Secondary | ICD-10-CM

## 2022-05-31 DIAGNOSIS — I10 Essential (primary) hypertension: Secondary | ICD-10-CM | POA: Diagnosis not present

## 2022-05-31 MED ORDER — NITROGLYCERIN 0.4 MG SL SUBL: 0.4000 mg | SUBLINGUAL_TABLET | SUBLINGUAL | 4 refills | Status: AC | PRN

## 2022-05-31 NOTE — Patient Instructions (Signed)
Medication Instructions:  The current medical regimen is effective;  continue present plan and medications.  *If you need a refill on your cardiac medications before your next appointment, please call your pharmacy*  Testing/Procedures: Your physician has requested that you have an echocardiogram. Echocardiography is a painless test that uses sound waves to create images of your heart. It provides your doctor with information about the size and shape of your heart and how well your heart's chambers and valves are working. This procedure takes approximately one hour. There are no restrictions for this procedure.  Follow-Up: At [facility], you and your health needs are our priority.  As part of our continuing mission to provide you with exceptional heart care, we have created designated Provider Care Teams.  These Care Teams include your primary Cardiologist (physician) and Advanced Practice Providers (APPs -  Physician Assistants and Nurse Practitioners) who all work together to provide you with the care you need, when you need it.  We recommend signing up for the patient portal called "MyChart".  Sign up information is provided on this After Visit Summary.  MyChart is used to connect with patients for Virtual Visits (Telemedicine).  Patients are able to view lab/test results, encounter notes, upcoming appointments, etc.  Non-urgent messages can be sent to your provider as well.   To learn more about what you can do with MyChart, go to [email or web address].    Your next appointment:   1 year(s)  The format for your next appointment:   In Person  Provider:   [name], MD     Important Information About Sugar

## 2022-06-01 ENCOUNTER — Other Ambulatory Visit: Payer: Self-pay | Admitting: Cardiology

## 2022-06-01 DIAGNOSIS — I255 Ischemic cardiomyopathy: Secondary | ICD-10-CM

## 2022-06-01 DIAGNOSIS — I2510 Atherosclerotic heart disease of native coronary artery without angina pectoris: Secondary | ICD-10-CM

## 2022-06-02 ENCOUNTER — Other Ambulatory Visit: Payer: Self-pay

## 2022-06-02 DIAGNOSIS — I6523 Occlusion and stenosis of bilateral carotid arteries: Secondary | ICD-10-CM

## 2022-06-05 ENCOUNTER — Ambulatory Visit: Payer: Medicare Other | Attending: Cardiology

## 2022-06-05 DIAGNOSIS — I255 Ischemic cardiomyopathy: Secondary | ICD-10-CM | POA: Diagnosis not present

## 2022-06-05 DIAGNOSIS — I2510 Atherosclerotic heart disease of native coronary artery without angina pectoris: Secondary | ICD-10-CM | POA: Diagnosis not present

## 2022-06-05 LAB — ECHOCARDIOGRAM COMPLETE
AR max vel: 2.61 cm2
AV Peak grad: 3.7 mmHg
AV Vena cont: 0.4 cm
Ao pk vel: 0.96 m/s
Area-P 1/2: 4.02 cm2
Calc EF: 40.9 %
MV M vel: 4.34 m/s
MV Peak grad: 75.4 mmHg
P 1/2 time: 1199 msec
Radius: 0.3 cm
S' Lateral: 4.08 cm
Single Plane A2C EF: 41.7 %
Single Plane A4C EF: 41.1 %

## 2022-06-15 ENCOUNTER — Encounter: Payer: Self-pay | Admitting: *Deleted

## 2022-06-29 ENCOUNTER — Other Ambulatory Visit: Payer: Self-pay

## 2022-06-29 DIAGNOSIS — Z122 Encounter for screening for malignant neoplasm of respiratory organs: Secondary | ICD-10-CM

## 2022-06-29 DIAGNOSIS — Z87891 Personal history of nicotine dependence: Secondary | ICD-10-CM

## 2022-07-03 ENCOUNTER — Other Ambulatory Visit: Payer: Medicare Other

## 2022-07-03 DIAGNOSIS — R972 Elevated prostate specific antigen [PSA]: Secondary | ICD-10-CM

## 2022-07-04 LAB — PSA, TOTAL AND FREE
PSA, Free Pct: 15.5 %
PSA, Free: 0.85 ng/mL
Prostate Specific Ag, Serum: 5.5 ng/mL — ABNORMAL HIGH (ref 0.0–4.0)

## 2022-07-10 ENCOUNTER — Ambulatory Visit: Payer: Medicare Other | Admitting: Urology

## 2022-07-10 ENCOUNTER — Encounter: Payer: Self-pay | Admitting: Urology

## 2022-07-10 VITALS — BP 132/85 | HR 68

## 2022-07-10 DIAGNOSIS — N138 Other obstructive and reflux uropathy: Secondary | ICD-10-CM

## 2022-07-10 DIAGNOSIS — N401 Enlarged prostate with lower urinary tract symptoms: Secondary | ICD-10-CM | POA: Diagnosis not present

## 2022-07-10 DIAGNOSIS — R972 Elevated prostate specific antigen [PSA]: Secondary | ICD-10-CM | POA: Diagnosis not present

## 2022-07-10 DIAGNOSIS — R351 Nocturia: Secondary | ICD-10-CM | POA: Diagnosis not present

## 2022-07-10 NOTE — Patient Instructions (Signed)

## 2022-07-10 NOTE — Progress Notes (Signed)
07/10/2022 11:25 AM   Ronnie Harvey Apr 19, 1946 [id number]  Referring provider: Loman Harvey, [place],  [place] [address]  Followup elevated PSA    HPI: Mr Ronnie Harvey is a 76yo here for followup for elevated PSA and BPH. IPSS 7 QOl 1 on no BPH therapy. Nocturia 1-2x. He is not bothered by his nocturia. PSA is Stable at 5.5. No other complaints today   PMH: Past Medical History:  Diagnosis Date   Arthritis    CAD (coronary artery disease) nonobstructive   a. 10/2012 Cath: LM 50, LAD 25p, 63m, Diag 1m, LCX 30p, 40d, OM1 60-70, OM3 30, RCA 40-53m, AM 70p, PDA nl, EF 20%. c.  Stents to RCA circumvlex  06/2017 in [place].     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 09/2012 Echo: EF 15-20%, diff HK, gr2 DD, Triv AI, Mod MR, mild TR/PR, mod-sev dil LA.   CHF (congestive heart failure) ([place])    Diverticulitis    Essential hypertension 10/16/2019   ETOH abuse    Quit 1998   GERD (gastroesophageal reflux disease)    rarely (worse when he was drinking)   Itchy skin    Myocardial infarction ([place])    PAF (paroxysmal atrial fibrillation) ([place])    PVD (peripheral vascular disease) ([place])    a. 05/2013 s/p PTA/self-expanding stenting of the REIA and LCIA.   Retinal hole of right eye    Shortness of breath dyspnea    with exertion   Tobacco abuse    Quit Dec 2012    Surgical History: Past Surgical History:  Procedure Laterality Date   ABDOMINAL AORTAGRAM N/A 05/28/2013   Procedure: ABDOMINAL AORTAGRAM;  Surgeon: [name], MD;  Location: [facility] CATH LAB;  Service: Cardiovascular;  Laterality: N/A;   CAROTID ANGIOGRAM Bilateral 10/27/2014   Procedure: CAROTID ANGIOGRAM;  Surgeon: Serafina Mitchell, MD;  Location: [facility] CATH LAB;  Service: Cardiovascular;  Laterality: Bilateral;   CAROTID ENDARTERECTOMY Left December 03, 2014   CEA   ENDARTERECTOMY Left 12/03/2014   Procedure: ENDARTERECTOMY CAROTID WITH PATCH ANGIOPLASTY;  Surgeon: Serafina Mitchell, MD;  Location: [facility] OR;   Service: Vascular;  Laterality: Left;   EYE SURGERY Right    cataract surgery with lens implant   Neck cyst resection     PERCUTANEOUS STENT INTERVENTION Right 05/28/2013   Procedure: PERCUTANEOUS STENT INTERVENTION;  Surgeon: [name], MD;  Location: [facility] CATH LAB;  Service: Cardiovascular;  Laterality: Right;   VASECTOMY      Home Medications:  Allergies as of 07/10/2022   No Known Allergies      Medication List        Accurate as of July 10, 2022 11:25 AM. If you have any questions, ask your nurse or doctor.          aspirin EC 81 MG tablet Take 1 tablet (81 mg total) by mouth daily.   atorvastatin 40 MG tablet Commonly known as: LIPITOR TAKE 1 TABLET BY MOUTH DAILY   carvedilol 12.5 MG tablet Commonly known as: COREG Take 1 tablet (12.5 mg total) by mouth 2 (two) times daily.   furosemide 20 MG tablet Commonly known as: LASIX TAKE 1 TABLET ON MONDAY,  WEDNESDAY AND FRIDAY   nitroGLYCERIN 0.4 MG SL tablet Commonly known as: NITROSTAT Place 1 tablet (0.4 mg total) under the tongue every 5 (five) minutes as needed for chest pain.   NON FORMULARY Colloidal silver 1 oz every morning.   spironolactone  25 MG tablet Commonly known as: ALDACTONE TAKE 1 TABLET ON MONDAY,  WEDNESDAY AND FRIDAY        Allergies: No Known Allergies  Family History: Family History  Problem Relation Age of Onset   Diabetes Mother    Diabetes Father    Diabetes Brother    COPD Brother    Breast cancer Sister 59   Diabetes Brother     Social History:  reports that he quit smoking about 9 years ago. His smoking use included cigarettes. He started smoking about 9 years ago. He has a 49.00 pack-year smoking history. He has quit using smokeless tobacco.  His smokeless tobacco use included snuff. He reports that he does not drink alcohol and does not use drugs.  ROS: All other review of systems were reviewed and are negative except what is noted above in HPI  Physical  Exam: BP 132/85   Pulse 68   Constitutional:  Alert and oriented, No acute distress. HEENT: Ronnie Harvey AT, moist mucus membranes.  Trachea midline, no masses. Cardiovascular: No clubbing, cyanosis, or edema. Respiratory: Normal respiratory effort, no increased work of breathing. GI: Abdomen is soft, nontender, nondistended, no abdominal masses GU: No CVA tenderness.  Lymph: No cervical or inguinal lymphadenopathy. Skin: No rashes, bruises or suspicious lesions. Neurologic: Grossly intact, no focal deficits, moving all 4 extremities. Psychiatric: Normal mood and affect.  Laboratory Data: Lab Results  Component Value Date   WBC 9.8 05/18/2022   HGB 14.9 05/18/2022   HCT 43.9 05/18/2022   MCV 95 05/18/2022   PLT 206 05/18/2022    Lab Results  Component Value Date   CREATININE 1.03 05/18/2022    No results found for: "PSA"  No results found for: "TESTOSTERONE"  Lab Results  Component Value Date   HGBA1C 5.7 (H) 09/07/2012    Urinalysis    Component Value Date/Time   COLORURINE YELLOW 11/25/2014 1058   APPEARANCEUR Clear 01/09/2022 1116   LABSPEC 1.010 11/25/2014 1058   PHURINE 5.5 11/25/2014 1058   GLUCOSEU Negative 01/09/2022 1116   HGBUR NEGATIVE 11/25/2014 1058   BILIRUBINUR Negative 01/09/2022 1116   KETONESUR NEGATIVE 11/25/2014 1058   PROTEINUR Negative 01/09/2022 1116   PROTEINUR NEGATIVE 11/25/2014 1058   UROBILINOGEN 0.2 11/25/2014 1058   NITRITE Negative 01/09/2022 1116   NITRITE NEGATIVE 11/25/2014 1058   LEUKOCYTESUR Negative 01/09/2022 1116    Lab Results  Component Value Date   LABMICR Comment 01/09/2022   WBCUA None seen 07/08/2021   LABEPIT None seen 07/08/2021   BACTERIA None seen 07/08/2021    Pertinent Imaging:  Results for orders placed in visit on 03/13/13  DG Abd 1 View  Narrative *RADIOLOGY REPORT*  Clinical Data: History of diarrhea.  ABDOMEN - 1 VIEW  Comparison: None.  Findings: There is obesity.  There is thoracolumbar  scoliosis with upper curve convexity to the right and lower curve convexity to the left.  Multilevel marginal osteophyte formation is seen representing degenerative spondylosis.  There is narrowing of some of the intervertebral disc spaces as well.  There is mild gaseous distention of several loops of small intestine.  Colonic gas is present.  Left upper quadrant bowel gas may reflect a combination of stomach and colon.  No opaque calculi are seen.  IMPRESSION: History given of diarrhea.  Mild gaseous distention of several loops of small intestine.  Colonic gas present.  Gas in left upper quadrant most likely reflects combination of stomach and colon. Air fluid levels cannot be evaluated without  erect or decubitus examination.  Chronic bony changes are detailed above.  Clinically significant discrepancy from primary report, if provided: None   Original Report Authenticated By: Shanon Brow Call  No results found for this or any previous visit.  No results found for this or any previous visit.  No results found for this or any previous visit.  No results found for this or any previous visit.  No valid procedures specified. No results found for this or any previous visit.  No results found for this or any previous visit.   Assessment & Plan:    1. Elevated PSA -RTC 6 months with a PSA  2. Benign prostatic hyperplasia with urinary obstruction -patient defers therapy at this time  3. Nocturia Decrease fluid intake within 2 hours of going to bed.    No follow-ups on file.  Nicolette Bang, MD  [facility] [place]

## 2022-07-25 ENCOUNTER — Other Ambulatory Visit: Payer: Self-pay | Admitting: Family Medicine

## 2022-07-25 DIAGNOSIS — I6523 Occlusion and stenosis of bilateral carotid arteries: Secondary | ICD-10-CM

## 2022-07-25 DIAGNOSIS — I2510 Atherosclerotic heart disease of native coronary artery without angina pectoris: Secondary | ICD-10-CM

## 2022-07-25 DIAGNOSIS — I739 Peripheral vascular disease, unspecified: Secondary | ICD-10-CM

## 2022-07-25 DIAGNOSIS — E782 Mixed hyperlipidemia: Secondary | ICD-10-CM

## 2022-08-15 ENCOUNTER — Encounter: Payer: Self-pay | Admitting: Acute Care

## 2022-08-15 ENCOUNTER — Ambulatory Visit (INDEPENDENT_AMBULATORY_CARE_PROVIDER_SITE_OTHER): Payer: Medicare Other | Admitting: Acute Care

## 2022-08-15 DIAGNOSIS — Z87891 Personal history of nicotine dependence: Secondary | ICD-10-CM | POA: Diagnosis not present

## 2022-08-15 NOTE — Progress Notes (Signed)
Virtual Visit via Telephone Note  I connected with Ronnie Harvey on 08/15/22 at  2:00 PM EST by telephone and verified that I am speaking with the correct person using two identifiers.  Location: Patient:  At home Provider: [address], [place], [place], [address]    I discussed the limitations, risks, security and privacy concerns of performing an evaluation and management service by telephone and the availability of in person appointments. I also discussed with the patient that there may be a patient responsible charge related to this service. The patient expressed understanding and agreed to proceed.   Shared Decision Making Visit Lung Cancer Screening Program [phone])   Eligibility: Age 76 y.o. Pack Years Smoking History Calculation 89 pack year smoking history (# packs/per year x # years smoked) Recent History of coughing up blood  no Unexplained weight loss? no ( >Than 15 pounds within the last 6 months ) Prior History Lung / other cancer no (Diagnosis within the last 5 years already requiring surveillance chest CT Scans). Smoking Status Former Smoker Former Smokers: Years since quit: 9 years  Quit Date: 2014  Visit Components: Discussion included one or more decision making aids. yes Discussion included risk/benefits of screening. yes Discussion included potential follow up diagnostic testing for abnormal scans. yes Discussion included meaning and risk of over diagnosis. yes Discussion included meaning and risk of False Positives. yes Discussion included meaning of total radiation exposure. yes  Counseling Included: Importance of adherence to annual lung cancer LDCT screening. yes Impact of comorbidities on ability to participate in the program. yes Ability and willingness to under diagnostic treatment. yes  Smoking Cessation Counseling: Current Smokers:  Discussed importance of smoking cessation. yes Information about tobacco cessation classes and  interventions provided to patient. yes Patient provided with "ticket" for LDCT Scan. yes Symptomatic Patient. no  Counseling NA Diagnosis Code: Tobacco Use Z72.0 Asymptomatic Patient yes  Counseling (Intermediate counseling: > three minutes counseling) H8850 Former Smokers:  Discussed the importance of maintaining cigarette abstinence. yes Diagnosis Code: Personal History of Nicotine Dependence. Y77.412 Information about tobacco cessation classes and interventions provided to patient. Yes Patient provided with "ticket" for LDCT Scan. yes Written Order for Lung Cancer Screening with LDCT placed in Epic. Yes (CT Chest Lung Cancer Screening Low Dose W/O CM) [id number] Z12.2-Screening of respiratory organs Z87.891-Personal history of nicotine dependence  I spent 25 minutes of face to face time/virtual visit time  with  Ronnie Harvey discussing the risks and benefits of lung cancer screening. We took the time to pause the power point at intervals to allow for questions to be asked and answered to ensure understanding. We discussed that he had taken the single most powerful action possible to decrease his risk of developing lung cancer when he quit smoking. I counseled him to remain smoke free, and to contact me if he ever had the desire to smoke again so that I can provide resources and tools to help support the effort to remain smoke free. We discussed the time and location of the scan, and that either  Abigail Miyamoto RN, Karlton Lemon, RN or I  or I will call / send a letter with the results within  24-72 hours of receiving them. He has the office contact information in the event he needs to speak with me,  he verbalized understanding of all of the above and had no further questions upon leaving the office.     I explained to the patient that there has  been a high incidence of coronary artery disease noted on these exams. I explained that this is a non-gated exam therefore degree or severity cannot be  determined. This patient is on statin therapy. I have asked the patient to follow-up with their PCP regarding any incidental finding of coronary artery disease and management with diet or medication as they feel is clinically indicated. The patient verbalized understanding of the above and had no further questions.     Bevelyn Ngo, NP 08/15/2022

## 2022-08-15 NOTE — Patient Instructions (Signed)
Thank you for participating in the [facility] Cancer Screening Program. It was our pleasure to meet you today. We will call you with the results of your scan within the next few days. Your scan will be assigned a Lung RADS category score by the physicians reading the scans.  This Lung RADS score determines follow up scanning.  See below for description of categories, and follow up screening recommendations. We will be in touch to schedule your follow up screening annually or based on recommendations of our providers. We will fax a copy of your scan results to your Primary Care Physician, or the physician who referred you to the program, to ensure they have the results. Please call the office if you have any questions or concerns regarding your scanning experience or results.  Our office number is [phone]. Please speak with Denise Phelps, RN. , or  Denise Buckner RN, They are  our Lung Cancer Screening RN.'s If They are unavailable when you call, Please leave a message on the voice mail. We will return your call at our earliest convenience.This voice mail is monitored several times a day.  Remember, if your scan is normal, we will scan you annually as long as you continue to meet the criteria for the program. (Age 55-77, Current smoker or smoker who has quit within the last 15 years). If you are a smoker, remember, quitting is the single most powerful action that you can take to decrease your risk of lung cancer and other pulmonary, breathing related problems. We know quitting is hard, and we are here to help.  Please let us know if there is anything we can do to help you meet your goal of quitting. If you are a former smoker, congratulations. We are proud of you! Remain smoke free! Remember you can refer friends or family members through the number above.  We will screen them to make sure they meet criteria for the program. Thank you for helping us take better care of you by  participating in Lung Screening.  You can receive free nicotine replacement therapy ( patches, gum or mints) by calling 1-800-QUIT NOW. Please call so we can get you on the path to becoming  a non-smoker. I know it is hard, but you can do this!  Lung RADS Categories:  Lung RADS 1: no nodules or definitely non-concerning nodules.  Recommendation is for a repeat annual scan in 12 months.  Lung RADS 2:  nodules that are non-concerning in appearance and behavior with a very low likelihood of becoming an active cancer. Recommendation is for a repeat annual scan in 12 months.  Lung RADS 3: nodules that are probably non-concerning , includes nodules with a low likelihood of becoming an active cancer.  Recommendation is for a 6-month repeat screening scan. Often noted after an upper respiratory illness. We will be in touch to make sure you have no questions, and to schedule your 6-month scan.  Lung RADS 4 A: nodules with concerning findings, recommendation is most often for a follow up scan in 3 months or additional testing based on our provider's assessment of the scan. We will be in touch to make sure you have no questions and to schedule the recommended 3 month follow up scan.  Lung RADS 4 B:  indicates findings that are concerning. We will be in touch with you to schedule additional diagnostic testing based on our provider's  assessment of the scan.  Other options for assistance in smoking cessation (   As covered by your insurance benefits)  Hypnosis for smoking cessation  [employer] [phone]  Acupuncture for smoking cessation  [facility] [phone]   

## 2022-08-16 ENCOUNTER — Ambulatory Visit (HOSPITAL_COMMUNITY)
Admission: RE | Admit: 2022-08-16 | Discharge: 2022-08-16 | Disposition: A | Discharge status: Home / Self Care | Payer: Medicare Other | Source: Ambulatory Visit | Attending: Acute Care | Admitting: Acute Care

## 2022-08-16 DIAGNOSIS — Z87891 Personal history of nicotine dependence: Secondary | ICD-10-CM | POA: Insufficient documentation

## 2022-08-16 DIAGNOSIS — J432 Centrilobular emphysema: Secondary | ICD-10-CM | POA: Insufficient documentation

## 2022-08-16 DIAGNOSIS — Z122 Encounter for screening for malignant neoplasm of respiratory organs: Secondary | ICD-10-CM | POA: Diagnosis not present

## 2022-08-16 DIAGNOSIS — I2510 Atherosclerotic heart disease of native coronary artery without angina pectoris: Secondary | ICD-10-CM | POA: Diagnosis not present

## 2022-08-16 DIAGNOSIS — I700 Atherosclerosis of aorta: Secondary | ICD-10-CM | POA: Insufficient documentation

## 2022-08-17 ENCOUNTER — Telehealth: Payer: Self-pay | Admitting: Acute Care

## 2022-08-17 DIAGNOSIS — R911 Solitary pulmonary nodule: Secondary | ICD-10-CM

## 2022-08-17 DIAGNOSIS — Z87891 Personal history of nicotine dependence: Secondary | ICD-10-CM

## 2022-08-17 NOTE — Telephone Encounter (Signed)
I have called the patient with the results of his low-dose screening CT.  I explained that his scan was read as a lung RADS 3, probably benign.  There is a new 7.4 mm nodule in his right upper lobe and we need to reevaluate it in 6 months.  This will be May 2024. I also let the patient know that there was notation of coronary artery disease.  The patient is followed by cardiology, Dr. Antoine Poche, he is on a statin, and also had a recent echo in September 2023.  I did let him know there was notation of a renal cyst that did not require any additional follow-up per radiology. Patient is in agreement with a 53-month follow-up low-dose screening CT. Denise please fax results to PCP, and let her know plan is for a 64-month follow-up screening CT.  Place order for 71-month follow-up.  Thank you so much

## 2022-08-17 NOTE — Telephone Encounter (Signed)
Results/plan faxed to PCP.  Order placed for 6 months LCS nodule follow up LDCT

## 2022-09-09 ENCOUNTER — Other Ambulatory Visit: Payer: Self-pay | Admitting: Cardiology

## 2022-09-25 ENCOUNTER — Other Ambulatory Visit: Payer: Self-pay | Admitting: Family Medicine

## 2022-09-25 DIAGNOSIS — I739 Peripheral vascular disease, unspecified: Secondary | ICD-10-CM

## 2022-09-25 DIAGNOSIS — I2510 Atherosclerotic heart disease of native coronary artery without angina pectoris: Secondary | ICD-10-CM

## 2022-09-25 DIAGNOSIS — E782 Mixed hyperlipidemia: Secondary | ICD-10-CM

## 2022-09-25 DIAGNOSIS — I6523 Occlusion and stenosis of bilateral carotid arteries: Secondary | ICD-10-CM

## 2022-11-30 ENCOUNTER — Other Ambulatory Visit: Payer: Self-pay | Admitting: Cardiology

## 2022-12-22 ENCOUNTER — Other Ambulatory Visit: Payer: Self-pay | Admitting: *Deleted

## 2022-12-22 DIAGNOSIS — I6523 Occlusion and stenosis of bilateral carotid arteries: Secondary | ICD-10-CM

## 2023-01-01 ENCOUNTER — Ambulatory Visit (HOSPITAL_COMMUNITY)
Admission: RE | Admit: 2023-01-01 | Discharge: 2023-01-01 | Disposition: A | Discharge status: Home / Self Care | Payer: Medicare Other | Source: Ambulatory Visit | Attending: Surgery | Admitting: Surgery

## 2023-01-01 ENCOUNTER — Encounter: Payer: Self-pay | Admitting: Physician Assistant

## 2023-01-01 ENCOUNTER — Ambulatory Visit: Payer: Medicare Other | Admitting: Physician Assistant

## 2023-01-01 VITALS — BP 134/71 | HR 63 | Temp 97.6°F | Resp 18 | Ht 62.0 in | Wt 125.0 lb

## 2023-01-01 DIAGNOSIS — I6522 Occlusion and stenosis of left carotid artery: Secondary | ICD-10-CM | POA: Diagnosis not present

## 2023-01-01 DIAGNOSIS — I6521 Occlusion and stenosis of right carotid artery: Secondary | ICD-10-CM | POA: Diagnosis not present

## 2023-01-01 DIAGNOSIS — I6523 Occlusion and stenosis of bilateral carotid arteries: Secondary | ICD-10-CM | POA: Diagnosis present

## 2023-01-01 NOTE — Progress Notes (Signed)
HISTORY AND PHYSICAL     CC:  follow up. Requesting Provider:  Gwenlyn Fudge, FNP  HPI: This is a 77 y.o. male here for follow up for carotid artery stenosis.  Pt is s/p left CEA and resection of CCA with primary anastomosis for asymptomatic carotid artery stenosis on 12/03/2014 by Dr. Myra Gianotti that has since occluded.      Pt was last seen 05/29/2022 and at that time he was not having any neurological sx.  His right ICA had 60-79% stenosis.   Pt returns today for follow up.    Pt denies any amaurosis fugax, speech difficulties, weakness, numbness, paralysis or clumsiness or facial droop.  He states he has some right hip pain with walking.  He does not have any claudication or rest pain or non healing wounds.   He is compliant with asa/statin.   The pt is on a statin for cholesterol management.  The pt is on a daily aspirin.   Other AC:  none The pt is on BB, diuretic for hypertension.   The pt is not on medication for diabetes Tobacco hx:  former  Pt does not have family hx of AAA.  Past Medical History:  Diagnosis Date   Arthritis    CAD (coronary artery disease) nonobstructive   a. 10/2012 Cath: LM 50, LAD 25p, 56m, Diag 49m, LCX 30p, 40d, OM1 60-70, OM3 30, RCA 40-56m, AM 70p, PDA nl, EF 20%. c.  Stents to RCA circumvlex  06/2017 in [place].     Cardiomyopathy, dilated (HCC)    a. 09/2012 Echo: EF 15-20%, diff HK, gr2 DD, Triv AI, Mod MR, mild TR/PR, mod-sev dil LA.   CHF (congestive heart failure) (HCC)    Diverticulitis    Essential hypertension 10/16/2019   ETOH abuse    Quit 1998   GERD (gastroesophageal reflux disease)    rarely (worse when he was drinking)   Itchy skin    Myocardial infarction (HCC)    PAF (paroxysmal atrial fibrillation) (HCC)    PVD (peripheral vascular disease) (HCC)    a. 05/2013 s/p PTA/self-expanding stenting of the REIA and LCIA.   Retinal hole of right eye    Shortness of breath dyspnea    with exertion   Tobacco abuse    Quit Dec  2012    Past Surgical History:  Procedure Laterality Date   ABDOMINAL AORTAGRAM N/A 05/28/2013   Procedure: ABDOMINAL AORTAGRAM;  Surgeon: Iran Ouch, MD;  Location: [facility] CATH LAB;  Service: Cardiovascular;  Laterality: N/A;   CAROTID ANGIOGRAM Bilateral 10/27/2014   Procedure: CAROTID ANGIOGRAM;  Surgeon: Nada Libman, MD;  Location: [facility] CATH LAB;  Service: Cardiovascular;  Laterality: Bilateral;   CAROTID ENDARTERECTOMY Left December 03, 2014   CEA   ENDARTERECTOMY Left 12/03/2014   Procedure: ENDARTERECTOMY CAROTID WITH PATCH ANGIOPLASTY;  Surgeon: Nada Libman, MD;  Location: [facility] OR;  Service: Vascular;  Laterality: Left;   EYE SURGERY Right    cataract surgery with lens implant   Neck cyst resection     PERCUTANEOUS STENT INTERVENTION Right 05/28/2013   Procedure: PERCUTANEOUS STENT INTERVENTION;  Surgeon: Iran Ouch, MD;  Location: [facility] CATH LAB;  Service: Cardiovascular;  Laterality: Right;   VASECTOMY      No Known Allergies  Current Outpatient Medications  Medication Sig Dispense Refill   aspirin EC 81 MG tablet Take 1 tablet (81 mg total) by mouth daily.     atorvastatin (LIPITOR) 40 MG tablet TAKE 1 TABLET  BY MOUTH DAILY 90 tablet 1   carvedilol (COREG) 12.5 MG tablet TAKE 1 TABLET BY MOUTH TWICE  DAILY 180 tablet 3   furosemide (LASIX) 20 MG tablet TAKE 1 TABLET ON MONDAY,  WEDNESDAY AND FRIDAY 39 tablet 3   nitroGLYCERIN (NITROSTAT) 0.4 MG SL tablet Place 1 tablet (0.4 mg total) under the tongue every 5 (five) minutes as needed for chest pain. 25 tablet 4   NON FORMULARY Colloidal silver 1 oz every morning.     spironolactone (ALDACTONE) 25 MG tablet PATIENT MUST SCHEDULE APPOINTMENT FOR FUTURE REFILLS TAKE 1 TABLET BY MOUTH MONDAY,  WEDNESDAY, AND FRIDAY 39 tablet 1   No current facility-administered medications for this visit.    Family History  Problem Relation Age of Onset   Diabetes Mother    Diabetes Father    Diabetes Brother    COPD Brother    Breast  cancer Sister 49   Diabetes Brother     Social History   Socioeconomic History   Marital status: Widowed    Spouse name: Not on file   Number of children: Not on file   Years of education: Not on file   Highest education level: Not on file  Occupational History   Occupation: Retired  Tobacco Use   Smoking status: Former    Packs/day: 1.00    Years: 49.00    Additional pack years: 0.00    Total pack years: 49.00    Types: Cigarettes    Start date: 08/30/2012    Quit date: 11/24/2012    Years since quitting: 10.1   Smokeless tobacco: Former    Types: Snuff  Substance and Sexual Activity   Alcohol use: No    Alcohol/week: 0.0 standard drinks of alcohol    Comment: Former ETOH.  None since 1998 Prior to 1998 was a heavy drinker   Drug use: No   Sexual activity: Not on file  Other Topics Concern   Not on file  Social History Narrative   Not on file   Social Determinants of Health   Financial Resource Strain: Not on file  Food Insecurity: Not on file  Transportation Needs: Not on file  Physical Activity: Not on file  Stress: Not on file  Social Connections: Not on file  Intimate Partner Violence: Not on file     REVIEW OF SYSTEMS:   [X]  denotes positive finding, [ ]  denotes negative finding Cardiac  Comments:  Chest pain or chest pressure:    Shortness of breath upon exertion:    Short of breath when lying flat:    Irregular heart rhythm:        Vascular    Pain in calf, thigh, or hip brought on by ambulation: x See HPI  Pain in feet at night that wakes you up from your sleep:     Blood clot in your veins:    Leg swelling:         Pulmonary    Oxygen at home:    Productive cough:     Wheezing:         Neurologic    Sudden weakness in arms or legs:     Sudden numbness in arms or legs:     Sudden onset of difficulty speaking or slurred speech:    Temporary loss of vision in one eye:     Problems with dizziness:         Gastrointestinal    Blood in  stool:  Vomited blood:         Genitourinary    Burning when urinating:     Blood in urine:        Psychiatric    Major depression:         Hematologic    Bleeding problems:    Problems with blood clotting too easily:        Skin    Rashes or ulcers:        Constitutional    Fever or chills:      PHYSICAL EXAMINATION:  Today's Vitals   01/01/23 1335 01/01/23 1338  BP: (!) 150/65 134/71  Pulse: 63 63  Resp: 18   Temp: 97.6 F (36.4 C)   TempSrc: Temporal   SpO2: 96%   Weight: 125 lb (56.7 kg)   Height:  (1.575 m)    Body mass index is 22.86 kg/m.   General:  WDWN in NAD; vital signs documented above Gait: Not observed HENT: WNL, normocephalic Pulmonary: normal non-labored breathing Cardiac: regular HR, with carotid bruit on the right Abdomen: soft, NT; aortic pulse is not palpable Skin: without rashes Vascular Exam/Pulses:  Right Left  Radial 2+ (normal) 2+ (normal)  Femoral 2+ (normal) 2+ (normal)  DP monophasic 2+ (normal)  PT Brisk biphasic 1+ (weak)   Extremities: without open wounds Musculoskeletal: no muscle wasting or atrophy  Neurologic: A&O X 3; moving all extremities equally; speech is fluent/normal Psychiatric:  The pt has Normal affect.   Non-Invasive Vascular Imaging:   Carotid Duplex on 01/01/2023 Right:  40-59% ICA stenosis Left:  occluded Vertebrals:  Bilateral vertebral arteries demonstrate antegrade flow.  Subclavians: Normal flow hemodynamics were seen in bilateral subclavian arteries.   Previous Carotid duplex on 05/29/2022: Right: 60-79% ICA stenosis Left:   occluded    ASSESSMENT/PLAN:: 77 y.o. male here for follow up carotid artery stenosis and hx of  left CEA and resection of CCA with primary anastomosis for asymptomatic carotid artery stenosis on 12/03/2014 by Dr. Myra Gianotti that subsequently occluded.  -duplex today reveals 40-59% right ICA stenosis (high end)-most likely essentially unchanged as the previous study  was in the low end of 60-79% range.  He remains asymptomatic.   -discussed s/s of stroke with pt and he understands should he develop any of these sx, he will go to the nearest ER or call 911. -pt will f/u in 6 months with carotid duplex -pt with right hip pain with walking-he has easily palpable femoral pulses bilaterally -pt will call sooner should he have any issues. -continue statin/asa   -7 lb unintentional weight loss since last visit-discussed that if he continues to lose weight, he needs to follow up with his PCP    Doreatha Massed, [facility] [phone]  Clinic MD:  Myra Gianotti

## 2023-01-02 ENCOUNTER — Other Ambulatory Visit: Payer: Medicare Other

## 2023-01-02 DIAGNOSIS — R972 Elevated prostate specific antigen [PSA]: Secondary | ICD-10-CM

## 2023-01-03 LAB — PSA, TOTAL AND FREE
PSA, Free Pct: 12 %
PSA, Free: 0.6 ng/mL
Prostate Specific Ag, Serum: 5 ng/mL — ABNORMAL HIGH (ref 0.0–4.0)

## 2023-01-09 ENCOUNTER — Other Ambulatory Visit: Payer: Self-pay

## 2023-01-09 DIAGNOSIS — I6523 Occlusion and stenosis of bilateral carotid arteries: Secondary | ICD-10-CM

## 2023-01-10 ENCOUNTER — Ambulatory Visit: Payer: Medicare Other | Admitting: Urology

## 2023-01-10 VITALS — BP 128/70 | HR 66

## 2023-01-10 DIAGNOSIS — R972 Elevated prostate specific antigen [PSA]: Secondary | ICD-10-CM

## 2023-01-10 DIAGNOSIS — N401 Enlarged prostate with lower urinary tract symptoms: Secondary | ICD-10-CM

## 2023-01-10 DIAGNOSIS — R351 Nocturia: Secondary | ICD-10-CM | POA: Diagnosis not present

## 2023-01-10 DIAGNOSIS — N138 Other obstructive and reflux uropathy: Secondary | ICD-10-CM

## 2023-01-10 LAB — URINALYSIS, ROUTINE W REFLEX MICROSCOPIC
Bilirubin, UA: NEGATIVE
Glucose, UA: NEGATIVE
Ketones, UA: NEGATIVE
Leukocytes,UA: NEGATIVE
Nitrite, UA: NEGATIVE
Protein,UA: NEGATIVE
RBC, UA: NEGATIVE
Specific Gravity, UA: 1.01 (ref 1.005–1.030)
Urobilinogen, Ur: 0.2 mg/dL (ref 0.2–1.0)
pH, UA: 5.5 (ref 5.0–7.5)

## 2023-01-10 NOTE — Progress Notes (Signed)
01/10/2023 10:46 AM   [name] [birth year] [id number]  Referring provider: Gwenlyn Fudge, FNP [address] [place],  [place] [address]  Followup elevated PSa and BPH   HPI: Mr Ronnie Harvey is a 77yo here for followup for elevated PSA and BPH.  PSA decreased to 5.0 from 5.5. IPSS 14 QOL 3 on no BPH therapy. He has starting/stopping of his urinary stream. Nocturia 1x.  He has intermittent straining to urinate.   PMH: Past Medical History:  Diagnosis Date   Arthritis    CAD (coronary artery disease) nonobstructive   a. 10/2012 Cath: LM 50, LAD 25p, 18m, Diag 71m, LCX 30p, 40d, OM1 60-70, OM3 30, RCA 40-80m, AM 70p, PDA nl, EF 20%. c.  Stents to RCA circumvlex  06/2017 in [place].     Cardiomyopathy, dilated    a. 09/2012 Echo: EF 15-20%, diff HK, gr2 DD, Triv AI, Mod MR, mild TR/PR, mod-sev dil LA.   CHF (congestive heart failure)    Diverticulitis    Essential hypertension 10/16/2019   ETOH abuse    Quit 1998   GERD (gastroesophageal reflux disease)    rarely (worse when he was drinking)   Itchy skin    Myocardial infarction    PAF (paroxysmal atrial fibrillation)    PVD (peripheral vascular disease)    a. 05/2013 s/p PTA/self-expanding stenting of the REIA and LCIA.   Retinal hole of right eye    Shortness of breath dyspnea    with exertion   Tobacco abuse    Quit Dec 2012    Surgical History: Past Surgical History:  Procedure Laterality Date   ABDOMINAL AORTAGRAM N/A 05/28/2013   Procedure: ABDOMINAL AORTAGRAM;  Surgeon: Iran Ouch, MD;  Location: [facility] CATH LAB;  Service: Cardiovascular;  Laterality: N/A;   CAROTID ANGIOGRAM Bilateral 10/27/2014   Procedure: CAROTID ANGIOGRAM;  Surgeon: Nada Libman, MD;  Location[facility] CATH LAB;  Service: Cardiovascular;  Laterality: Bilateral;   CAROTID ENDARTERECTOMY Left December 03, 2014   CEA   ENDARTERECTOMY Left 12/03/2014   Procedure: ENDARTERECTOMY CAROTID WITH PATCH ANGIOPLASTY;  Surgeon: Nada Libman, MD;  Location:  [facility] OR;  Service: Vascular;  Laterality: Left;   EYE SURGERY Right    cataract surgery with lens implant   Neck cyst resection     PERCUTANEOUS STENT INTERVENTION Right 05/28/2013   Procedure: PERCUTANEOUS STENT INTERVENTION;  Surgeon: Iran Ouch, MD;  Location: [facility] CATH LAB;  Service: Cardiovascular;  Laterality: Right;   VASECTOMY      Home Medications:  Allergies as of 01/10/2023   No Known Allergies      Medication List        Accurate as of January 10, 2023 10:46 AM. If you have any questions, ask your nurse or doctor.          aspirin EC 81 MG tablet Take 1 tablet (81 mg total) by mouth daily.   atorvastatin 40 MG tablet Commonly known as: LIPITOR TAKE 1 TABLET BY MOUTH DAILY   carvedilol 12.5 MG tablet Commonly known as: COREG TAKE 1 TABLET BY MOUTH TWICE  DAILY   furosemide 20 MG tablet Commonly known as: LASIX TAKE 1 TABLET ON MONDAY,  WEDNESDAY AND FRIDAY   nitroGLYCERIN 0.4 MG SL tablet Commonly known as: NITROSTAT Place 1 tablet (0.4 mg total) under the tongue every 5 (five) minutes as needed for chest pain.   NON FORMULARY Colloidal silver 1 oz every morning.   spironolactone 25 MG tablet  Commonly known as: ALDACTONE PATIENT MUST SCHEDULE APPOINTMENT FOR FUTURE REFILLS TAKE 1 TABLET BY MOUTH MONDAY,  WEDNESDAY, AND FRIDAY        Allergies: No Known Allergies  Family History: Family History  Problem Relation Age of Onset   Diabetes Mother    Diabetes Father    Diabetes Brother    COPD Brother    Breast cancer Sister 67   Diabetes Brother     Social History:  reports that he quit smoking about 10 years ago. His smoking use included cigarettes. He started smoking about 10 years ago. He has a 49.00 pack-year smoking history. He has quit using smokeless tobacco.  His smokeless tobacco use included snuff. He reports that he does not drink alcohol and does not use drugs.  ROS: All other review of systems were reviewed and are negative  except what is noted above in HPI  Physical Exam: BP 128/70   Pulse 66   Constitutional:  Alert and oriented, No acute distress. HEENT: [place] AT, moist mucus membranes.  Trachea midline, no masses. Cardiovascular: No clubbing, cyanosis, or edema. Respiratory: Normal respiratory effort, no increased work of breathing. GI: Abdomen is soft, nontender, nondistended, no abdominal masses GU: No CVA tenderness.  Lymph: No cervical or inguinal lymphadenopathy. Skin: No rashes, bruises or suspicious lesions. Neurologic: Grossly intact, no focal deficits, moving all 4 extremities. Psychiatric: Normal mood and affect.  Laboratory Data: Lab Results  Component Value Date   WBC 9.8 05/18/2022   HGB 14.9 05/18/2022   HCT 43.9 05/18/2022   MCV 95 05/18/2022   PLT 206 05/18/2022    Lab Results  Component Value Date   CREATININE 1.03 05/18/2022    No results found for: "PSA"  No results found for: "TESTOSTERONE"  Lab Results  Component Value Date   HGBA1C 5.7 (H) 09/07/2012    Urinalysis    Component Value Date/Time   COLORURINE YELLOW 11/25/2014 1058   APPEARANCEUR Clear 01/09/2022 1116   LABSPEC 1.010 11/25/2014 1058   PHURINE 5.5 11/25/2014 1058   GLUCOSEU Negative 01/09/2022 1116   HGBUR NEGATIVE 11/25/2014 1058   BILIRUBINUR Negative 01/09/2022 1116   KETONESUR NEGATIVE 11/25/2014 1058   PROTEINUR Negative 01/09/2022 1116   PROTEINUR NEGATIVE 11/25/2014 1058   UROBILINOGEN 0.2 11/25/2014 1058   NITRITE Negative 01/09/2022 1116   NITRITE NEGATIVE 11/25/2014 1058   LEUKOCYTESUR Negative 01/09/2022 1116    Lab Results  Component Value Date   LABMICR Comment 01/09/2022   WBCUA None seen 07/08/2021   LABEPIT None seen 07/08/2021   BACTERIA None seen 07/08/2021    Pertinent Imaging:  Results for orders placed in visit on 03/13/13  DG Abd 1 View  Narrative *RADIOLOGY REPORT*  Clinical Data: History of diarrhea.  ABDOMEN - 1 VIEW  Comparison: None.  Findings:  There is obesity.  There is thoracolumbar scoliosis with upper curve convexity to the right and lower curve convexity to the left.  Multilevel marginal osteophyte formation is seen representing degenerative spondylosis.  There is narrowing of some of the intervertebral disc spaces as well.  There is mild gaseous distention of several loops of small intestine.  Colonic gas is present.  Left upper quadrant bowel gas may reflect a combination of stomach and colon.  No opaque calculi are seen.  IMPRESSION: History given of diarrhea.  Mild gaseous distention of several loops of small intestine.  Colonic gas present.  Gas in left upper quadrant most likely reflects combination of stomach and colon. Air fluid  levels cannot be evaluated without erect or decubitus examination.  Chronic bony changes are detailed above.  Clinically significant discrepancy from primary report, if provided: None   Original Report Authenticated By: Onalee Hua Call  No results found for this or any previous visit.  No results found for this or any previous visit.  No results found for this or any previous visit.  No results found for this or any previous visit.  No valid procedures specified. No results found for this or any previous visit.  No results found for this or any previous visit.   Assessment & Plan:    1. Elevated PSA Followup 1 year with PSA - Urinalysis, Routine w reflex microscopic  2. Benign prostatic hyperplasia with urinary obstruction -patient defers therapy at this time  3. Nocturia -decrease fluid intake within 2 hours of going to bed   No follow-ups on file.  Wilkie Aye, MD  [facility] [place]

## 2023-01-16 ENCOUNTER — Encounter: Payer: Self-pay | Admitting: Urology

## 2023-02-02 IMAGING — US US SCROTUM W/ DOPPLER COMPLETE
1 series · 14 of 25 positions shown · non-contrast
Comparison: None.

CLINICAL DATA: 75-year-old with testicular pain for 1 month. Pain
in both testicles.

EXAM:
SCROTAL ULTRASOUND
DOPPLER ULTRASOUND OF THE TESTICLES
TECHNIQUE: Complete ultrasound examination of the testicles, epididymis, and
other scrotal structures was performed. Color and spectral Doppler
ultrasound were also utilized to evaluate blood flow to the
testicles.

[Series 1: us scrotum w/doppler · 14 of 76 slices shown]
[im 1/76]
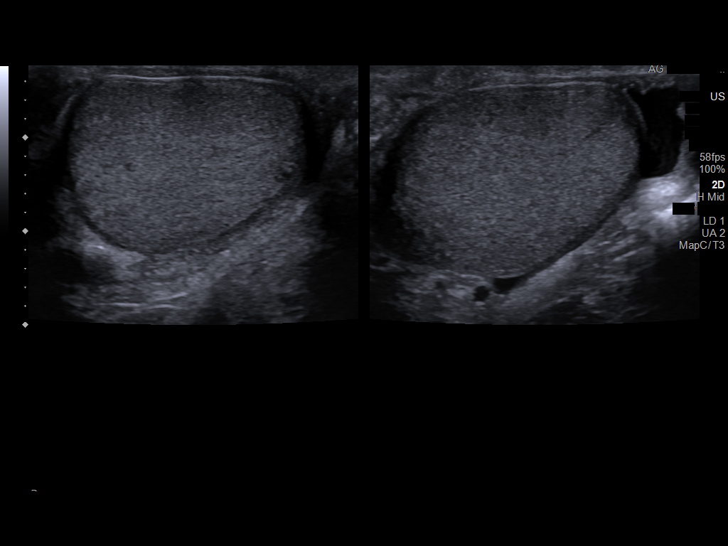
[im 7/76]
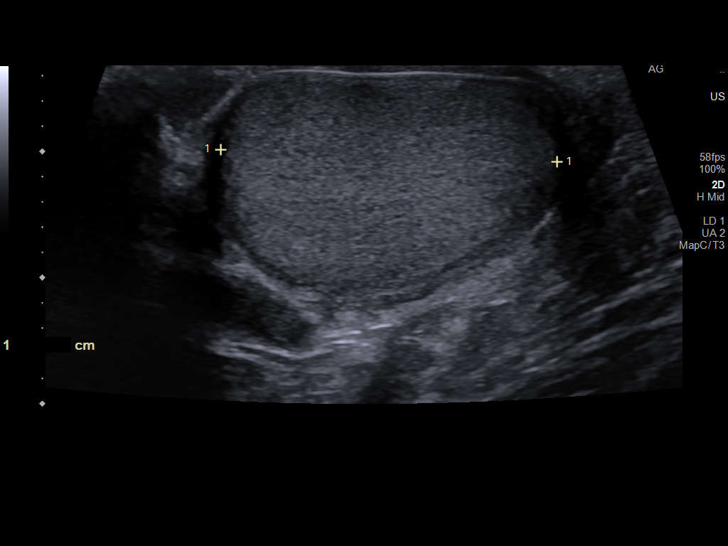
[im 13/76]
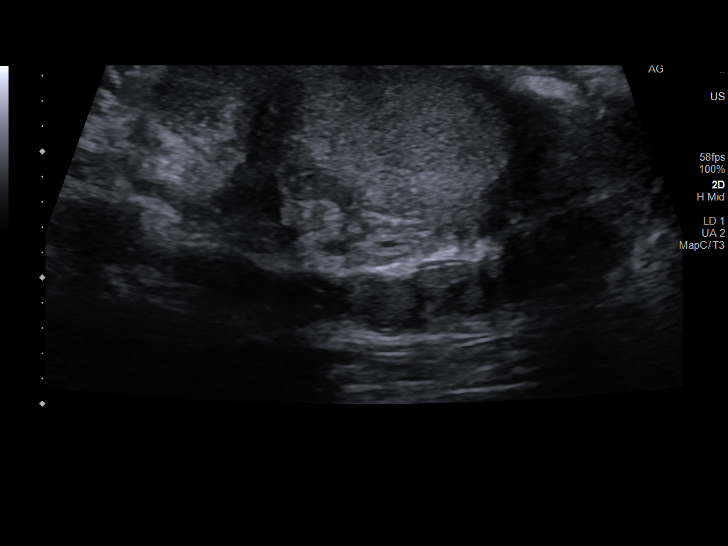
[im 19/76]
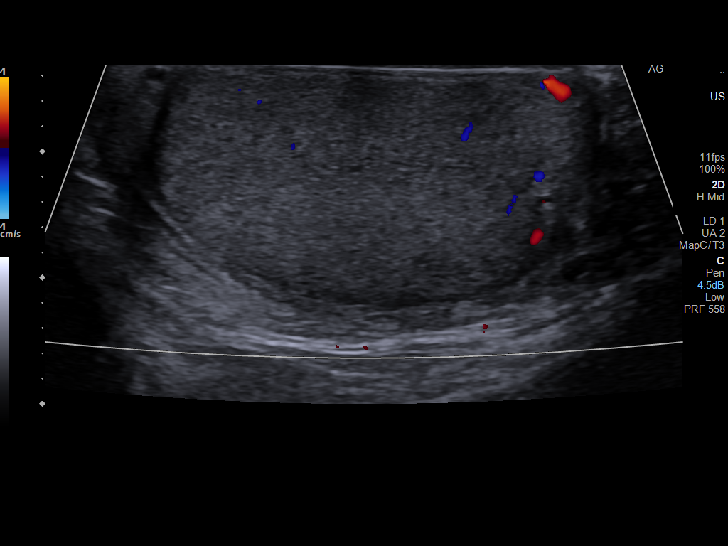
[im 26/76]
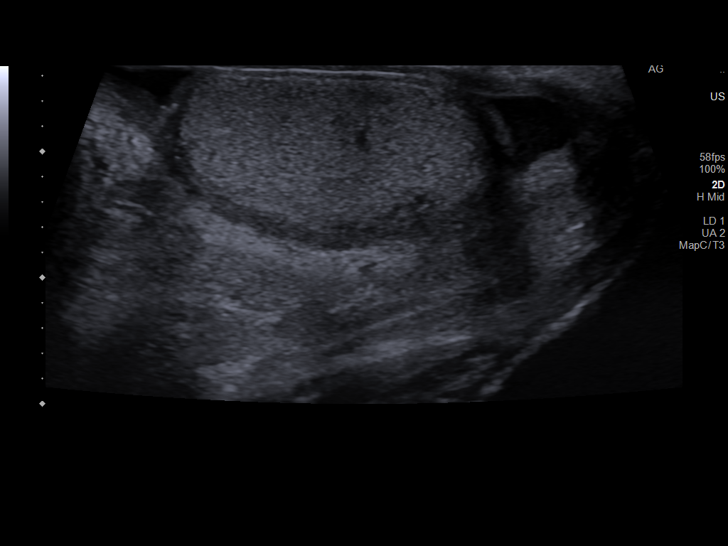
[im 29/76]
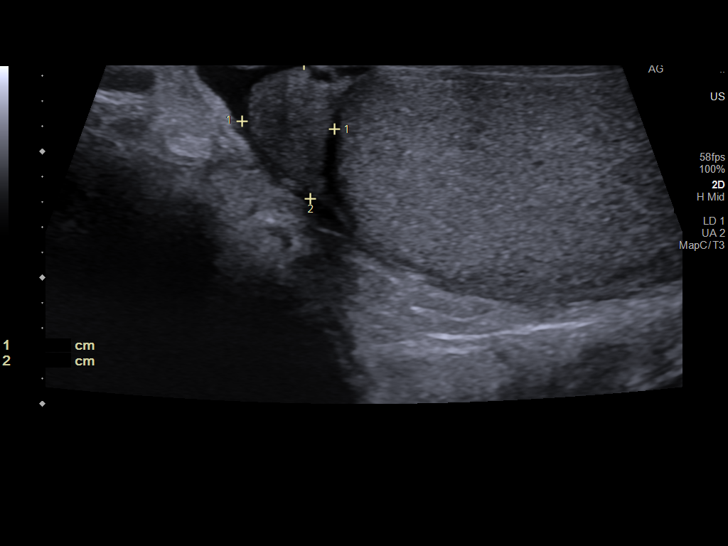
[im 35/76]
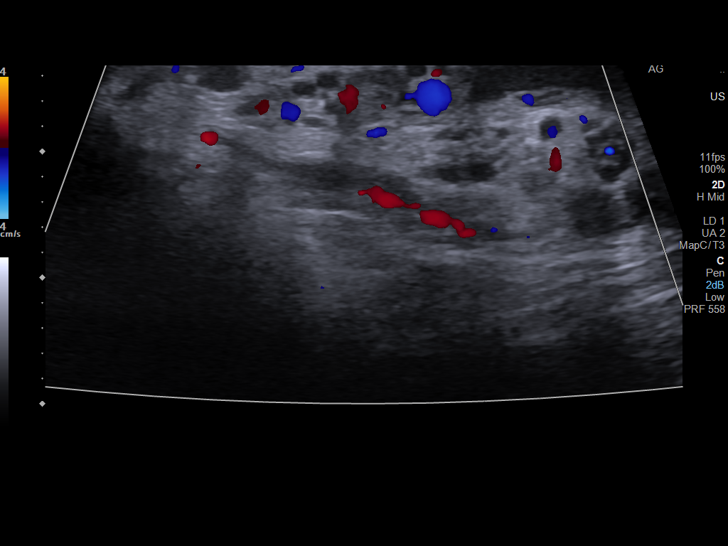
[im 41/76]
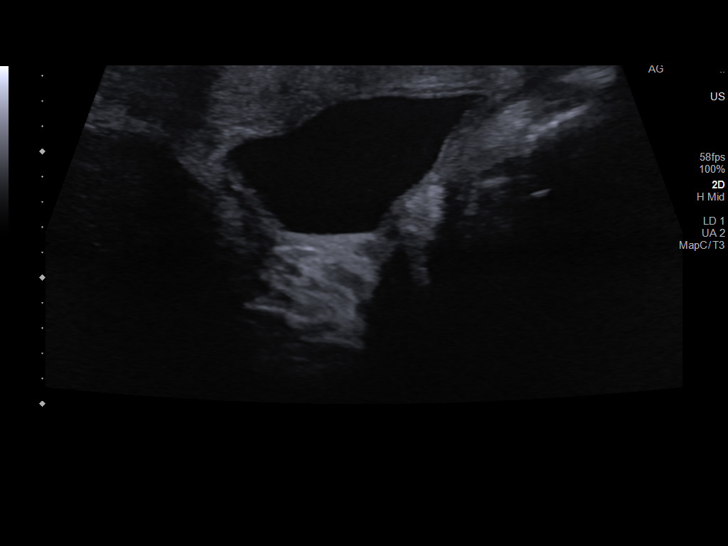
[im 47/76]
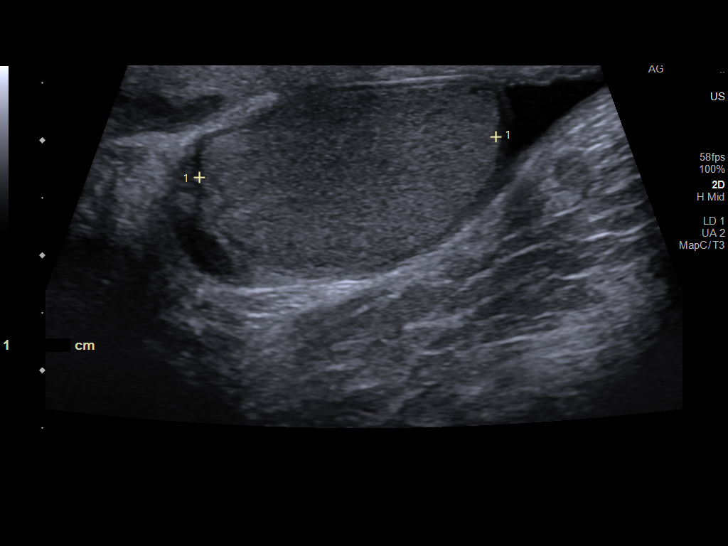
[im 51/76]
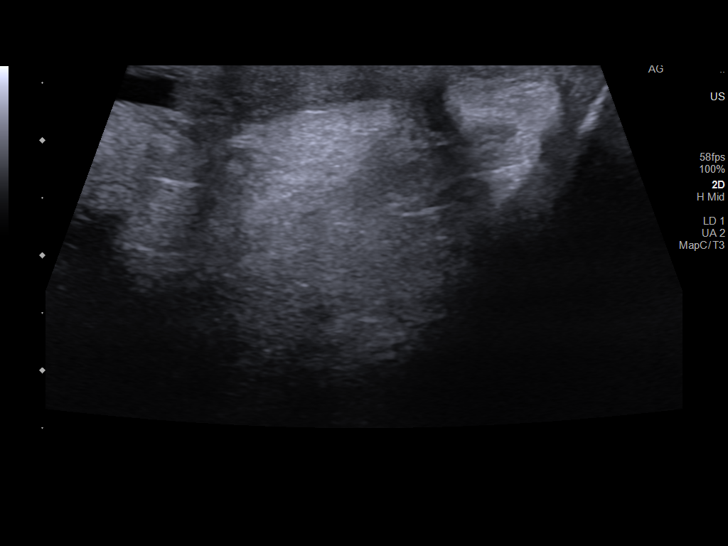
[im 57/76]
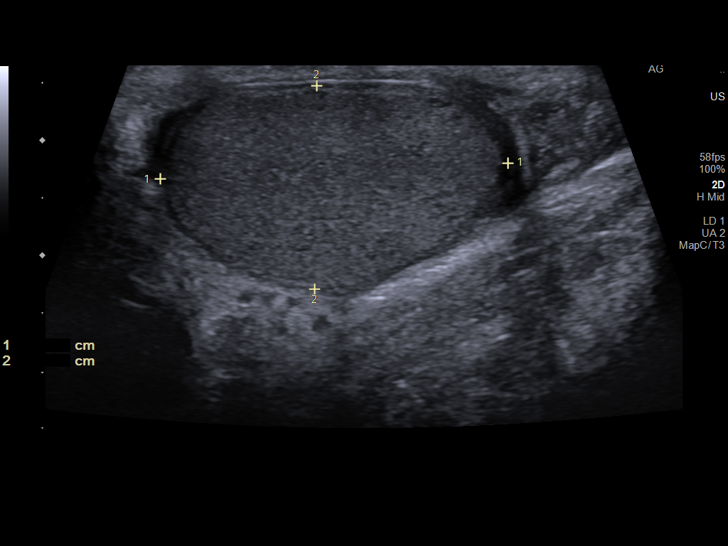
[im 63/76]
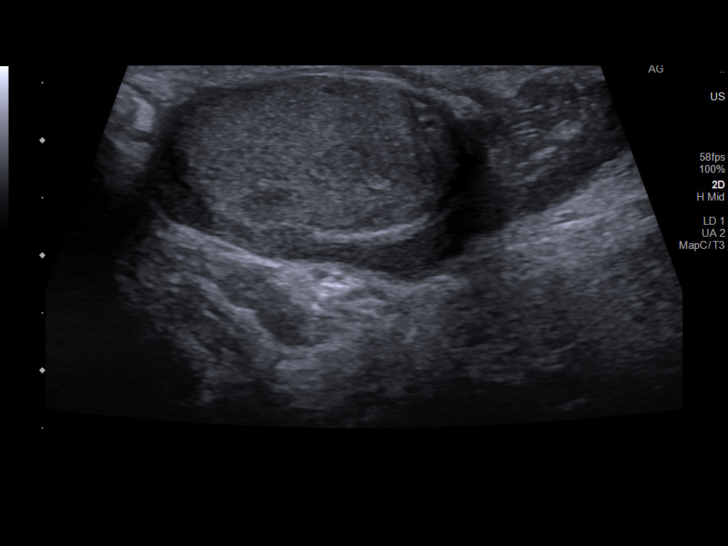
[im 69/76]
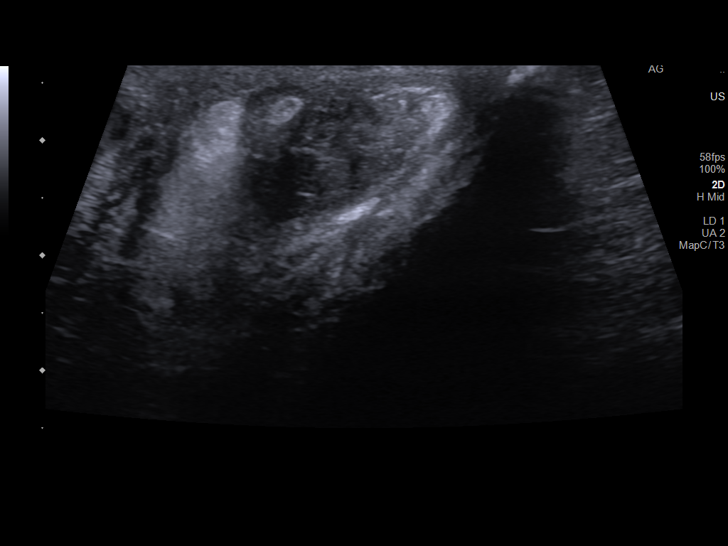
[im 76/76]
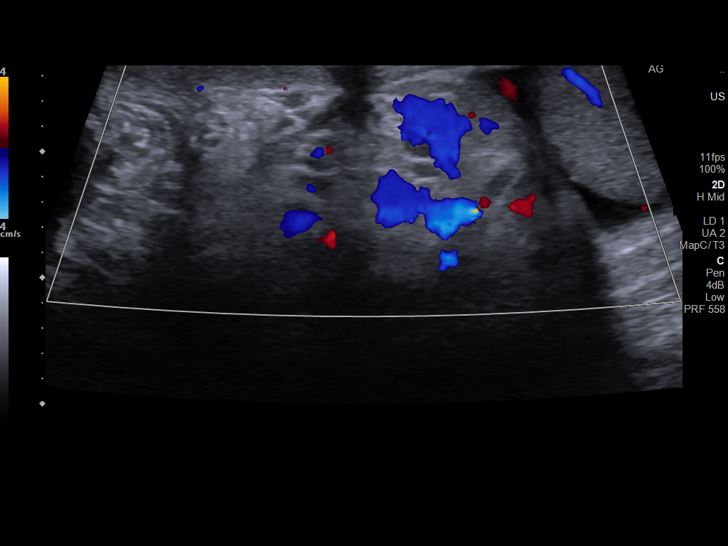

[14 of 25 positions shown; findings below may reference images not displayed]

FINDINGS: Right testicle

Measurements: 3.5 x 1.8 x 2.7 cm. Homogeneous echogenicity. Normal
blood flow. No mass or microlithiasis visualized.

Left testicle

Measurements: 3.0 x 1.8 x 2.6 cm. Homogeneous echogenicity. Normal
blood flow. No mass or microlithiasis visualized.

Right epididymis:  Normal in size and appearance.  No hyperemia.

Left epididymis:  Normal in size and appearance.  No hyperemia.

Hydrocele:  Small bilateral, left greater than right.

Varicocele:  Present on the right.

Pulsed Doppler interrogation of both testes demonstrates normal low
resistance arterial and venous waveforms bilaterally.
IMPRESSION: 1. Right-sided varicocele.
2. Small bilateral hydroceles.
3. Normal sonographic appearance of both testis and epididymi.

## 2023-02-06 ENCOUNTER — Encounter: Payer: Self-pay | Admitting: Family Medicine

## 2023-02-06 ENCOUNTER — Ambulatory Visit (INDEPENDENT_AMBULATORY_CARE_PROVIDER_SITE_OTHER): Payer: Medicare Other

## 2023-02-06 ENCOUNTER — Ambulatory Visit (INDEPENDENT_AMBULATORY_CARE_PROVIDER_SITE_OTHER): Payer: Medicare Other | Admitting: Family Medicine

## 2023-02-06 VITALS — BP 152/72 | HR 61 | Temp 97.1°F | Ht 62.0 in | Wt 127.4 lb

## 2023-02-06 DIAGNOSIS — G8929 Other chronic pain: Secondary | ICD-10-CM | POA: Diagnosis not present

## 2023-02-06 DIAGNOSIS — J3489 Other specified disorders of nose and nasal sinuses: Secondary | ICD-10-CM | POA: Diagnosis not present

## 2023-02-06 DIAGNOSIS — M25551 Pain in right hip: Secondary | ICD-10-CM

## 2023-02-06 MED ORDER — FLUTICASONE PROPIONATE 50 MCG/ACT NA SUSP: 2.0000 | Freq: Every day | NASAL | 6 refills | Status: DC

## 2023-02-06 MED ORDER — PREDNISONE 20 MG PO TABS: 40.0000 mg | ORAL_TABLET | Freq: Every day | ORAL | 0 refills | Status: AC

## 2023-02-06 NOTE — Progress Notes (Signed)
Acute Office Visit  Subjective:     Patient ID: Ronnie Harvey, male    DOB: 03-28-1946, 77 y.o.   MRN: [id number]  Chief Complaint  Patient presents with   Hip Pain    Ongoing right hip pain     Hip Pain  The incident occurred more than 1 week ago. There was no injury mechanism. The pain is present in the right hip. The quality of the pain is described as aching and shooting. The pain is moderate. The pain has been Constant since onset. Pertinent negatives include no inability to bear weight, loss of motion, loss of sensation, numbness or tingling. He reports no foreign bodies present. The symptoms are aggravated by movement, palpation and weight bearing. He has tried nothing for the symptoms.    Review of Systems  HENT:         Rhinorrhea  Musculoskeletal:  Positive for joint pain and myalgias. Negative for back pain, falls and neck pain.  Neurological: Negative.  Negative for tingling and numbness.  All other systems reviewed and are negative.       Objective:    BP (!) 152/72   Pulse 61   Temp (!) 97.1 F (36.2 C) (Temporal)   Ht 5\' 2"  (1.575 m)   Wt 127 lb 6.4 oz (57.8 kg)   SpO2 94%   BMI 23.30 kg/m  BP Readings from Last 3 Encounters:  02/06/23 (!) 152/72  01/10/23 128/70  01/01/23 134/71   Wt Readings from Last 3 Encounters:  02/06/23 127 lb 6.4 oz (57.8 kg)  01/01/23 125 lb (56.7 kg)  05/31/22 134 lb (60.8 kg)      Physical Exam Vitals and nursing note reviewed.  Constitutional:      General: He is not in acute distress.    Appearance: Normal appearance. He is well-developed, well-groomed and normal weight. He is not ill-appearing, toxic-appearing or diaphoretic.  HENT:     Head: Normocephalic and atraumatic.     Jaw: There is normal jaw occlusion.     Right Ear: Hearing, ear canal and external ear normal. A middle ear effusion is present. Tympanic membrane is not erythematous.     Left Ear: Hearing, ear canal and external ear normal. A middle  ear effusion is present. Tympanic membrane is not erythematous.     Nose: Septal deviation and rhinorrhea present. Rhinorrhea is clear.     Mouth/Throat:     Lips: Pink.     Mouth: Mucous membranes are moist.     Pharynx: Oropharynx is clear. Uvula midline.     Comments: Cobblestoning to posterior oropharynx Eyes:     General: Lids are normal.     Extraocular Movements: Extraocular movements intact.     Conjunctiva/sclera: Conjunctivae normal.     Pupils: Pupils are equal, round, and reactive to light.  Neck:     Thyroid: No thyroid mass, thyromegaly or thyroid tenderness.     Vascular: No carotid bruit or JVD.     Trachea: Trachea and phonation normal.  Cardiovascular:     Rate and Rhythm: Normal rate and regular rhythm.     Chest Wall: PMI is not displaced.     Pulses: Normal pulses.     Heart sounds: Normal heart sounds. No murmur heard.    No friction rub. No gallop.  Pulmonary:     Effort: Pulmonary effort is normal. No respiratory distress.     Breath sounds: Normal breath sounds. No wheezing.  Abdominal:  General: Bowel sounds are normal. There is no distension or abdominal bruit.     Palpations: Abdomen is soft. There is no hepatomegaly or splenomegaly.     Tenderness: There is no abdominal tenderness. There is no right CVA tenderness or left CVA tenderness.     Hernia: No hernia is present.  Musculoskeletal:     Cervical back: Normal range of motion and neck supple.     Lumbar back: Normal.     Right hip: Tenderness and bony tenderness present. Decreased range of motion.     Right upper leg: Normal.     Right lower leg: No edema.     Left lower leg: No edema.  Lymphadenopathy:     Cervical: No cervical adenopathy.  Skin:    General: Skin is warm and dry.     Capillary Refill: Capillary refill takes less than 2 seconds.     Coloration: Skin is not cyanotic, jaundiced or pale.     Findings: No rash.  Neurological:     General: No focal deficit present.      Mental Status: He is alert and oriented to person, place, and time.     Sensory: Sensation is intact.     Motor: Motor function is intact.     Coordination: Coordination is intact.     Gait: Gait is intact.     Deep Tendon Reflexes: Reflexes are normal and symmetric.  Psychiatric:        Attention and Perception: Attention and perception normal.        Mood and Affect: Mood and affect normal.        Speech: Speech normal.        Behavior: Behavior normal. Behavior is cooperative.        Thought Content: Thought content normal.        Cognition and Memory: Cognition and memory normal.        Judgment: Judgment normal.    X-Ray: right hip: significant degenerative changes of hip and lumbar spine. Preliminary x-ray reading by Kari Baars, FNP-C, [facility].       Assessment & Plan:   Problem List Items Addressed This Visit   None Visit Diagnoses     Chronic hip pain, right    -  Primary   Relevant Medications   predniSONE (DELTASONE) 20 MG tablet   Other Relevant Orders   DG HIP UNILAT W OR W/O PELVIS 2-3 VIEWS RIGHT   Ambulatory referral to Orthopedic Surgery   Rhinorrhea       Relevant Medications   fluticasone (FLONASE) 50 MCG/ACT nasal spray       Meds ordered this encounter  Medications   fluticasone (FLONASE) 50 MCG/ACT nasal spray    Sig: Place 2 sprays into both nostrils daily.    Dispense:  16 g    Refill:  6    Order Specific Question:   Supervising Provider    Answer:   Mechele Claude [[id number]]   predniSONE (DELTASONE) 20 MG tablet    Sig: Take 2 tablets (40 mg total) by mouth daily with breakfast for 5 days. 2 po daily for 5 days    Dispense:  10 tablet    Refill:  0    Order Specific Question:   Supervising Provider    Answer:   Mechele Claude [[id number]]    Return in 4 months (on 06/09/2023), or if symptoms worsen or fail to improve, for CPE.  Kari Baars, FNP

## 2023-02-14 ENCOUNTER — Ambulatory Visit (HOSPITAL_COMMUNITY)
Admission: RE | Admit: 2023-02-14 | Discharge: 2023-02-14 | Disposition: A | Discharge status: Home / Self Care | Payer: Medicare Other | Source: Ambulatory Visit | Attending: Acute Care | Admitting: Acute Care

## 2023-02-14 DIAGNOSIS — Z87891 Personal history of nicotine dependence: Secondary | ICD-10-CM | POA: Insufficient documentation

## 2023-02-14 DIAGNOSIS — R911 Solitary pulmonary nodule: Secondary | ICD-10-CM | POA: Diagnosis present

## 2023-02-16 ENCOUNTER — Ambulatory Visit (INDEPENDENT_AMBULATORY_CARE_PROVIDER_SITE_OTHER): Payer: Medicare Other | Admitting: Orthopedic Surgery

## 2023-02-16 ENCOUNTER — Encounter: Payer: Self-pay | Admitting: Orthopedic Surgery

## 2023-02-16 VITALS — BP 128/64 | HR 66 | Ht 62.0 in | Wt 127.0 lb

## 2023-02-16 DIAGNOSIS — M1611 Unilateral primary osteoarthritis, right hip: Secondary | ICD-10-CM | POA: Diagnosis not present

## 2023-02-16 NOTE — Patient Instructions (Signed)

## 2023-02-16 NOTE — Progress Notes (Signed)
New Patient Visit  Assessment: Ronnie Harvey is a 77 y.o. male with the following: 1. Arthritis of right hip  Plan: BIRGER SYLVAIN has advanced degenerative changes of the right hip.  He has pain in his groin, with limited motion.  He is only tried Tylenol and is using CBD cream at this point.  We reviewed the radiographs together, which demonstrates advanced degenerative change of the right hip, with flattening of the femoral head.  I offered him a steroid injection, and this was completed today under ultrasound guidance.  He will contact clinic if he has any further issues.  Ultimately, he may require a hip replacement, but he is not interested at this time.  Procedure note injection - Right hip, ultrasound guidance   Verbal consent was obtained to inject the Right hip joint  Timeout was completed to confirm the site of injection.   Using the ultrasound, the femoral neck was identified.  The joint space was also identified. The skin was prepped with alcohol and ethyl chloride was sprayed at the injection site.  A 21-gauge needle was used to inject 40 mg of Depo-Medrol and 1% lidocaine (4 cc) into the hip joint of the Right hip using a direct anterior approach.  The needle was visualized entering the hip joint, and the medication was also visualized. There were no complications.  A sterile bandage was applied.   Note: In order to accurately identify the placement of the needle, ultrasound was required, to increase the accuracy, and specificity of the injection.   Follow-up: Return if symptoms worsen or fail to improve.  Subjective:  Chief Complaint  Patient presents with   Hip Pain    R hip for over a yr getting worse in the past 2 mos.     History of Present Illness: Ronnie Harvey is a 76 y.o. male who has been referred by Gilford Silvius, FNP for evaluation of right hip pain.  He denies a history of injury to the right hip.  He has had progressively worsening pain all around the  right hip for the past year or so.  Over the past 2 months, it is getting worse.  He has been using CBD cream over the lateral hip.  He describes pain in his groin.  It is limiting his function.  He avoids activities which make the pain worse.  He has not had an injection.  He has not worked with physical therapy.   Review of Systems: No fevers or chills No numbness or tingling No chest pain No shortness of breath No bowel or bladder dysfunction No GI distress No headaches   Medical History:  Past Medical History:  Diagnosis Date   Arthritis    CAD (coronary artery disease) nonobstructive   a. 10/2012 Cath: LM 50, LAD 25p, 7m, Diag 16m, LCX 30p, 40d, OM1 60-70, OM3 30, RCA 40-40m, AM 70p, PDA nl, EF 20%. c.  Stents to RCA circumvlex  06/2017 in [place].     Cardiomyopathy, dilated (HCC)    a. 09/2012 Echo: EF 15-20%, diff HK, gr2 DD, Triv AI, Mod MR, mild TR/PR, mod-sev dil LA.   CHF (congestive heart failure) (HCC)    Diverticulitis    Essential hypertension 10/16/2019   ETOH abuse    Quit 1998   GERD (gastroesophageal reflux disease)    rarely (worse when he was drinking)   Itchy skin    Myocardial infarction (HCC)    PAF (paroxysmal atrial fibrillation) (HCC)  PVD (peripheral vascular disease) (HCC)    a. 05/2013 s/p PTA/self-expanding stenting of the REIA and LCIA.   Retinal hole of right eye    Shortness of breath dyspnea    with exertion   Tobacco abuse    Quit Dec 2012    Past Surgical History:  Procedure Laterality Date   ABDOMINAL AORTAGRAM N/A 05/28/2013   Procedure: ABDOMINAL AORTAGRAM;  Surgeon: Iran Ouch, MD;  Location: [facility] CATH LAB;  Service: Cardiovascular;  Laterality: N/A;   CAROTID ANGIOGRAM Bilateral 10/27/2014   Procedure: CAROTID ANGIOGRAM;  Surgeon: Nada Libman, MD;  Location: [facility] CATH LAB;  Service: Cardiovascular;  Laterality: Bilateral;   CAROTID ENDARTERECTOMY Left December 03, 2014   CEA   ENDARTERECTOMY Left 12/03/2014   Procedure:  ENDARTERECTOMY CAROTID WITH PATCH ANGIOPLASTY;  Surgeon: Nada Libman, MD;  Location: [facility] OR;  Service: Vascular;  Laterality: Left;   EYE SURGERY Right    cataract surgery with lens implant   Neck cyst resection     PERCUTANEOUS STENT INTERVENTION Right 05/28/2013   Procedure: PERCUTANEOUS STENT INTERVENTION;  Surgeon: Iran Ouch, MD;  Location: [facility] CATH LAB;  Service: Cardiovascular;  Laterality: Right;   VASECTOMY      Family History  Problem Relation Age of Onset   Diabetes Mother    Diabetes Father    Diabetes Brother    COPD Brother    Breast cancer Sister 33   Diabetes Brother    Social History   Tobacco Use   Smoking status: Former    Packs/day: 1.00    Years: 49.00    Additional pack years: 0.00    Total pack years: 49.00    Types: Cigarettes    Start date: 08/30/2012    Quit date: 11/24/2012    Years since quitting: 10.2   Smokeless tobacco: Former    Types: Snuff  Substance Use Topics   Alcohol use: No    Alcohol/week: 0.0 standard drinks of alcohol    Comment: Former ETOH.  None since 1998 Prior to 1998 was a heavy drinker   Drug use: No    No Known Allergies  No outpatient medications have been marked as taking for the 02/16/23 encounter (Office Visit) with Oliver Barre, MD.    Objective: BP 128/64   Pulse 66   Ht 5\' 2"  (1.575 m)   Wt 127 lb (57.6 kg)   BMI 23.23 kg/m   Physical Exam:  General: Alert and oriented. and No acute distress. Gait: Right sided antalgic gait.  Right hip without deformity.  He does have some tenderness over the greater trochanter laterally.  Limited internal rotation, with pain in his groin.  He tolerates 25 degrees of external rotation.  Negative straight leg raise.  Toes warm and well-perfused.  Active motion is intact distally.  He has good lower body strength, with good dorsiflexion of the ankle and the great toe.  2+ DP pulse.  IMAGING: I personally reviewed images previously obtained in clinic  X-rays of  the right hip were previously obtained.  Advanced degenerative changes, flattening of the femoral head.  There is subchondral sclerosis in the acetabulum.  There appear to be cysts within the femoral head.   New Medications:  No orders of the defined types were placed in this encounter.     Oliver Barre, MD  02/16/2023 11:42 AM

## 2023-02-19 ENCOUNTER — Other Ambulatory Visit: Payer: Self-pay | Admitting: Acute Care

## 2023-02-19 DIAGNOSIS — Z87891 Personal history of nicotine dependence: Secondary | ICD-10-CM

## 2023-02-19 DIAGNOSIS — Z122 Encounter for screening for malignant neoplasm of respiratory organs: Secondary | ICD-10-CM

## 2023-02-21 ENCOUNTER — Other Ambulatory Visit: Payer: Self-pay | Admitting: Cardiology

## 2023-04-25 ENCOUNTER — Encounter: Payer: Self-pay | Admitting: *Deleted

## 2023-05-25 ENCOUNTER — Other Ambulatory Visit: Payer: Self-pay | Admitting: Family Medicine

## 2023-05-25 DIAGNOSIS — E782 Mixed hyperlipidemia: Secondary | ICD-10-CM

## 2023-05-25 DIAGNOSIS — I739 Peripheral vascular disease, unspecified: Secondary | ICD-10-CM

## 2023-05-25 DIAGNOSIS — I2510 Atherosclerotic heart disease of native coronary artery without angina pectoris: Secondary | ICD-10-CM

## 2023-05-25 DIAGNOSIS — I6523 Occlusion and stenosis of bilateral carotid arteries: Secondary | ICD-10-CM

## 2023-06-08 ENCOUNTER — Encounter: Payer: Medicare Other | Admitting: Family Medicine

## 2023-06-13 ENCOUNTER — Encounter: Payer: Self-pay | Admitting: Family Medicine

## 2023-06-13 ENCOUNTER — Ambulatory Visit: Payer: Medicare Other | Admitting: Family Medicine

## 2023-06-13 VITALS — BP 147/73 | HR 63 | Temp 97.3°F | Ht 62.0 in | Wt 128.6 lb

## 2023-06-13 DIAGNOSIS — Z1329 Encounter for screening for other suspected endocrine disorder: Secondary | ICD-10-CM

## 2023-06-13 DIAGNOSIS — Z125 Encounter for screening for malignant neoplasm of prostate: Secondary | ICD-10-CM

## 2023-06-13 DIAGNOSIS — Z0000 Encounter for general adult medical examination without abnormal findings: Secondary | ICD-10-CM | POA: Diagnosis not present

## 2023-06-13 DIAGNOSIS — Z23 Encounter for immunization: Secondary | ICD-10-CM

## 2023-06-13 NOTE — Progress Notes (Signed)
Complete physical exam  Patient: Ronnie Harvey   DOB: 06/06/1946   77 y.o. Male  MRN: [id number]  Subjective:    Chief Complaint  Patient presents with   Annual Exam    Ronnie Harvey is a 77 y.o. male who presents today for a complete physical exam. He reports consuming a general diet. The patient does not participate in regular exercise at present. He generally feels fairly well. He reports sleeping well. He does not have additional problems to discuss today. He does have arthritis and was recently seen by ortho and had a right hip injection. He is followed by cardiology, urology, vascular surgery, and pulmonology on a regular basis. States he has been doing well.    Most recent fall risk assessment:    02/06/2023   12:11 PM  Fall Risk   Falls in the past year? 0     Most recent depression screenings:    02/06/2023   12:11 PM 05/18/2022    1:03 PM  PHQ 2/9 Scores  PHQ - 2 Score 0 0  PHQ- 9 Score 0 1    Vision:Within last year, Dental: No current dental problems and Receives regular dental care, and PSA: Agrees to PSA testing  Patient Active Problem List   Diagnosis Date Noted   Chronic systolic HF (heart failure) (HCC) 03/29/2020   Educated about COVID-19 virus infection 11/25/2019   Essential hypertension 10/16/2019   Carotid stenosis 12/03/2014   Hyperlipidemia 09/02/2013   Peripheral arterial disease (HCC) 05/20/2013   Paroxysmal AF 09/03/2012   CAD (coronary artery disease) 09/03/2012   Past Medical History:  Diagnosis Date   Arthritis    CAD (coronary artery disease) nonobstructive   a. 10/2012 Cath: LM 50, LAD 25p, 73m, Diag 8m, LCX 30p, 40d, OM1 60-70, OM3 30, RCA 40-26m, AM 70p, PDA nl, EF 20%. c.  Stents to RCA circumvlex  06/2017 in [place].     Cardiomyopathy, dilated (HCC)    a. 09/2012 Echo: EF 15-20%, diff HK, gr2 DD, Triv AI, Mod MR, mild TR/PR, mod-sev dil LA.   CHF (congestive heart failure) (HCC)    Diverticulitis    Essential hypertension  10/16/2019   ETOH abuse    Quit 1998   GERD (gastroesophageal reflux disease)    rarely (worse when he was drinking)   Itchy skin    Myocardial infarction (HCC)    PAF (paroxysmal atrial fibrillation) (HCC)    PVD (peripheral vascular disease) (HCC)    a. 05/2013 s/p PTA/self-expanding stenting of the REIA and LCIA.   Retinal hole of right eye    Shortness of breath dyspnea    with exertion   Tobacco abuse    Quit Dec 2012   Past Surgical History:  Procedure Laterality Date   ABDOMINAL AORTAGRAM N/A 05/28/2013   Procedure: ABDOMINAL AORTAGRAM;  Surgeon: Iran Ouch, MD;  Location: [facility] CATH [facility];  Service: Cardiovascular;  Laterality: N/A;   CAROTID ANGIOGRAM Bilateral 10/27/2014   Procedure: CAROTID ANGIOGRAM;  Surgeon: Nada Libman, MD;  Location: [facility] CATH LAB;  Service: Cardiovascular;  Laterality: Bilateral;   CAROTID ENDARTERECTOMY Left December 03, 2014   CEA   ENDARTERECTOMY Left 12/03/2014   Procedure: ENDARTERECTOMY CAROTID WITH PATCH ANGIOPLASTY;  Surgeon: Nada Libman, MD;  Location: [facility] OR;  Service: Vascular;  Laterality: Left;   EYE SURGERY Right    cataract surgery with lens implant   Neck cyst resection     PERCUTANEOUS STENT INTERVENTION Right 05/28/2013  Procedure: PERCUTANEOUS STENT INTERVENTION;  Surgeon: Iran Ouch, MD;  Location: [facility] CATH LAB;  Service: Cardiovascular;  Laterality: Right;   VASECTOMY     Social History   Tobacco Use   Smoking status: Former    Current packs/day: 0.00    Average packs/day: 1 pack/day for 49.0 years (49.0 ttl pk-yrs)    Types: Cigarettes    Start date: 08/30/2012    Quit date: 11/24/2012    Years since quitting: 10.5   Smokeless tobacco: Former    Types: Snuff  Substance Use Topics   Alcohol use: No    Alcohol/week: 0.0 standard drinks of alcohol    Comment: Former ETOH.  None since 1998 Prior to 1998 was a heavy drinker   Drug use: No   Social History   Socioeconomic History   Marital status: Widowed     Spouse name: Not on file   Number of children: Not on file   Years of education: Not on file   Highest education level: Not on file  Occupational History   Occupation: Retired  Tobacco Use   Smoking status: Former    Current packs/day: 0.00    Average packs/day: 1 pack/day for 49.0 years (49.0 ttl pk-yrs)    Types: Cigarettes    Start date: 08/30/2012    Quit date: 11/24/2012    Years since quitting: 10.5   Smokeless tobacco: Former    Types: Snuff  Substance and Sexual Activity   Alcohol use: No    Alcohol/week: 0.0 standard drinks of alcohol    Comment: Former ETOH.  None since 1998 Prior to 1998 was a heavy drinker   Drug use: No   Sexual activity: Not on file  Other Topics Concern   Not on file  Social History Narrative   Not on file   Social Determinants of Health   Financial Resource Strain: Not on file  Food Insecurity: Not on file  Transportation Needs: Not on file  Physical Activity: Not on file  Stress: Not on file  Social Connections: Not on file  Intimate Partner Violence: Not on file   Family Status  Relation Name Status   Mother  Deceased at age 29   Father  Deceased at age 83   Brother  Alive   Sister  Alive   Brother  Alive   Brother  Alive   Brother  Alive   MGM  Deceased   MGF  Deceased   PGM  Deceased   PGF  Deceased  No partnership data on file   Family History  Problem Relation Age of Onset   Diabetes Mother    Diabetes Father    Diabetes Brother    COPD Brother    Breast cancer Sister 31   Diabetes Brother    No Known Allergies    Patient Care Team: Shawntae Lowy, Doralee Albino, FNP as PCP - General (Family Medicine) [name], MD as Consulting Physician (Cardiology) Bennie Pierini, FNP as Nurse Practitioner (Family Medicine) Iran Ouch, MD as Consulting Physician (Cardiology)   Outpatient Medications Prior to Visit  Medication Sig   aspirin EC 81 MG tablet Take 1 tablet (81 mg total) by mouth daily.   atorvastatin  (LIPITOR) 40 MG tablet TAKE 1 TABLET BY MOUTH DAILY   carvedilol (COREG) 12.5 MG tablet TAKE 1 TABLET BY MOUTH TWICE  DAILY   fluticasone (FLONASE) 50 MCG/ACT nasal spray Place 2 sprays into both nostrils daily.   furosemide (LASIX) 20 MG tablet TAKE  1 TABLET BY MOUTH ON MONDAY WEDNESDAY AND FRIDAY   nitroGLYCERIN (NITROSTAT) 0.4 MG SL tablet Place 1 tablet (0.4 mg total) under the tongue every 5 (five) minutes as needed for chest pain.   NON FORMULARY Colloidal silver 1 oz every morning.   spironolactone (ALDACTONE) 25 MG tablet PATIENT MUST SCHEDULE APPOINTMENT FOR FUTURE REFILLS TAKE 1 TABLET BY MOUTH MONDAY,  WEDNESDAY, AND FRIDAY   No facility-administered medications prior to visit.    Review of Systems  HENT:  Negative for hearing loss.   Eyes:  Negative for blurred vision, double vision and photophobia.  Respiratory:  Negative for cough and shortness of breath.   Cardiovascular:  Negative for chest pain, palpitations, orthopnea, claudication, leg swelling and PND.  Gastrointestinal:  Negative for abdominal pain, blood in stool, constipation and diarrhea.  Genitourinary:  Urgency: nocturia x 1-2 times per night.  Musculoskeletal:  Positive for back pain, joint pain and myalgias. Negative for falls and neck pain.  All other systems reviewed and are negative.         Objective:     BP (!) 147/73   Pulse 63   Temp (!) 97.3 F (36.3 C) (Temporal)   Ht 5\' 2"  (1.575 m)   Wt 128 lb 9.6 oz (58.3 kg)   SpO2 92%   BMI 23.52 kg/m  BP Readings from Last 3 Encounters:  06/13/23 (!) 147/73  02/16/23 128/64  02/06/23 (!) 152/72   Wt Readings from Last 3 Encounters:  06/13/23 128 lb 9.6 oz (58.3 kg)  02/16/23 127 lb (57.6 kg)  02/06/23 127 lb 6.4 oz (57.8 kg)   SpO2 Readings from Last 3 Encounters:  06/13/23 92%  02/06/23 94%  01/01/23 96%      Physical Exam Vitals and nursing note reviewed.  Constitutional:      General: He is not in acute distress.    Appearance:  Normal appearance. He is normal weight. He is not ill-appearing, toxic-appearing or diaphoretic.  HENT:     Head: Normocephalic and atraumatic.     Right Ear: Tympanic membrane, ear canal and external ear normal.     Left Ear: Ear canal and external ear normal.     Nose: Nose normal.     Mouth/Throat:     Mouth: Mucous membranes are moist.     Pharynx: Oropharynx is clear.  Eyes:     Extraocular Movements: Extraocular movements intact.     Conjunctiva/sclera: Conjunctivae normal.     Pupils: Pupils are equal, round, and reactive to light.  Neck:     Thyroid: No thyroid mass, thyromegaly or thyroid tenderness.     Vascular: Carotid bruit present.  Cardiovascular:     Rate and Rhythm: Normal rate and regular rhythm.     Heart sounds: Normal heart sounds.  Pulmonary:     Effort: Pulmonary effort is normal.     Breath sounds: Normal breath sounds.  Abdominal:     General: Bowel sounds are normal.     Palpations: Abdomen is soft.     Tenderness: There is no abdominal tenderness.  Musculoskeletal:     Cervical back: Normal range of motion and neck supple.     Right lower leg: No edema.     Left lower leg: No edema.  Lymphadenopathy:     Cervical: No cervical adenopathy.  Skin:    General: Skin is warm and dry.     Capillary Refill: Capillary refill takes less than 2 seconds.  Neurological:  General: No focal deficit present.     Mental Status: He is alert and oriented to person, place, and time.     Gait: Gait abnormal (antalgic gait).  Psychiatric:        Mood and Affect: Mood normal.        Behavior: Behavior normal.        Thought Content: Thought content normal.        Judgment: Judgment normal.       Last CBC Lab Results  Component Value Date   WBC 9.8 05/18/2022   HGB 14.9 05/18/2022   HCT 43.9 05/18/2022   MCV 95 05/18/2022   MCH 32.1 05/18/2022   RDW 12.0 05/18/2022   PLT 206 05/18/2022   Last metabolic panel Lab Results  Component Value Date    GLUCOSE 92 05/18/2022   NA 141 05/18/2022   K 4.7 05/18/2022   CL 102 05/18/2022   CO2 25 05/18/2022   BUN 21 05/18/2022   CREATININE 1.03 05/18/2022   EGFR 75 05/18/2022   CALCIUM 9.5 05/18/2022   PHOS 2.5 09/06/2012   PROT 7.5 05/18/2022   ALBUMIN 4.3 05/18/2022   LABGLOB 3.2 05/18/2022   AGRATIO 1.3 05/18/2022   BILITOT 0.8 05/18/2022   ALKPHOS 124 (H) 05/18/2022   AST 16 05/18/2022   ALT 10 05/18/2022   ANIONGAP 7 12/04/2014   Last lipids Lab Results  Component Value Date   CHOL 102 05/18/2022   HDL 31 (L) 05/18/2022   LDLCALC 54 05/18/2022   LDLDIRECT 74.9 09/02/2013   TRIG 87 05/18/2022   CHOLHDL 3.3 05/18/2022   Last hemoglobin A1c Lab Results  Component Value Date   HGBA1C 5.7 (H) 09/07/2012   Last thyroid functions Lab Results  Component Value Date   TSH 1.998 09/04/2012        Assessment & Plan:    Routine Health Maintenance and Physical Exam  Immunization History  Administered Date(s) Administered   Influenza,inj,Quad PF,6+ Mos 09/02/2013   PFIZER(Purple Top)SARS-COV-2 Vaccination 12/18/2019, 01/09/2020, 08/23/2020   Pneumococcal Conjugate-13 10/16/2019   Pneumococcal Polysaccharide-23 09/07/2012   Tdap 10/16/2019   Zoster Recombinant(Shingrix) 07/16/2018, 09/16/2018    Health Maintenance  Topic Date Due   Medicare Annual Wellness (AWV)  Never done   INFLUENZA VACCINE  04/19/2023   COVID-19 Vaccine (4 - 2023-24 season) 06/29/2023 (Originally 05/20/2023)   Lung Cancer Screening  02/14/2024   DTaP/Tdap/Td (2 - Td or Tdap) 10/15/2029   Pneumonia Vaccine 3+ Years old  Completed   Hepatitis C Screening  Completed   Zoster Vaccines- Shingrix  Completed   HPV VACCINES  Aged Out    Discussed health benefits of physical activity, and encouraged him to engage in regular exercise appropriate for his age and condition.  Problem List Items Addressed This Visit   None Visit Diagnoses     Annual physical exam    -  Primary   Relevant Orders    CBC with Differential/Platelet   CMP14+EGFR   Lipid panel   Thyroid Panel With TSH   PSA, total and free   Screening for malignant neoplasm of prostate       Relevant Orders   PSA, total and free   Screening for endocrine disorder       Relevant Orders   CMP14+EGFR   Thyroid Panel With TSH      Return if symptoms worsen or fail to improve.     Kari Baars, FNP

## 2023-06-14 LAB — CMP14+EGFR
ALT: 10 IU/L (ref 0–44)
AST: 23 IU/L (ref 0–40)
Albumin: 4.3 g/dL (ref 3.8–4.8)
Alkaline Phosphatase: 134 IU/L — ABNORMAL HIGH (ref 44–121)
BUN/Creatinine Ratio: 22 (ref 10–24)
BUN: 21 mg/dL (ref 8–27)
Bilirubin Total: 0.7 mg/dL (ref 0.0–1.2)
CO2: 21 mmol/L (ref 20–29)
Calcium: 9.4 mg/dL (ref 8.6–10.2)
Chloride: 102 mmol/L (ref 96–106)
Creatinine, Ser: 0.96 mg/dL (ref 0.76–1.27)
Globulin, Total: 2.8 g/dL (ref 1.5–4.5)
Glucose: 101 mg/dL — ABNORMAL HIGH (ref 70–99)
Potassium: 4.4 mmol/L (ref 3.5–5.2)
Sodium: 138 mmol/L (ref 134–144)
Total Protein: 7.1 g/dL (ref 6.0–8.5)
eGFR: 81 mL/min/{1.73_m2} (ref >59)

## 2023-06-14 LAB — CBC WITH DIFFERENTIAL/PLATELET
Basophils Absolute: 0 10*3/uL (ref 0.0–0.2)
Basos: 1 %
EOS (ABSOLUTE): 0.3 10*3/uL (ref 0.0–0.4)
Eos: 3 %
Hematocrit: 43.6 % (ref 37.5–51.0)
Hemoglobin: 14.6 g/dL (ref 13.0–17.7)
Immature Grans (Abs): 0 10*3/uL (ref 0.0–0.1)
Immature Granulocytes: 0 %
Lymphocytes Absolute: 1.7 10*3/uL (ref 0.7–3.1)
Lymphs: 23 %
MCH: 32.2 pg (ref 26.6–33.0)
MCHC: 33.5 g/dL (ref 31.5–35.7)
MCV: 96 fL (ref 79–97)
Monocytes Absolute: 0.8 10*3/uL (ref 0.1–0.9)
Monocytes: 10 %
Neutrophils Absolute: 4.9 10*3/uL (ref 1.4–7.0)
Neutrophils: 63 %
Platelets: 205 10*3/uL (ref 150–450)
RBC: 4.53 x10E6/uL (ref 4.14–5.80)
RDW: 11.7 % (ref 11.6–15.4)
WBC: 7.7 10*3/uL (ref 3.4–10.8)

## 2023-06-14 LAB — LIPID PANEL
Chol/HDL Ratio: 3.4 ratio (ref 0.0–5.0)
Cholesterol, Total: 105 mg/dL (ref 100–199)
HDL: 31 mg/dL — ABNORMAL LOW (ref >39)
LDL Chol Calc (NIH): 56 mg/dL (ref 0–99)
Triglycerides: 93 mg/dL (ref 0–149)
VLDL Cholesterol Cal: 18 mg/dL (ref 5–40)

## 2023-06-14 LAB — PSA, TOTAL AND FREE
PSA, Free Pct: 11.2 %
PSA, Free: 0.48 ng/mL
Prostate Specific Ag, Serum: 4.3 ng/mL — ABNORMAL HIGH (ref 0.0–4.0)

## 2023-06-14 LAB — THYROID PANEL WITH TSH
Free Thyroxine Index: 3 (ref 1.2–4.9)
T3 Uptake Ratio: 29 % (ref 24–39)
T4, Total: 10.2 ug/dL (ref 4.5–12.0)
TSH: 1.69 u[IU]/mL (ref 0.450–4.500)

## 2023-06-26 NOTE — Progress Notes (Unsigned)
Cardiology Office Note:   Date:  06/27/2023  ID:  Ronnie Harvey, DOB 20-Sep-1945, MRN [id number] PCP: Sonny Masters, FNP  [facility] Providers Cardiologist:  None {  History of Present Illness:   Ronnie Harvey is a 77 y.o. male who presents for followup of cardiomyopathy. He was seen in consultation by [place] at the time of diverticulitis and bowel microperforation a few years ago. He had atrial fibrillation as part of this hospitalization. He had respiratory failure and required intubation. His ejection fraction was 20%.  He was not anticoagulated at that time because of his ongoing acute issues. Cardiac catheterization demonstrated left main 50% stenosis, LAD 40% stenosis, circumflex 30% followed by distal 40% stenosis, small obtuse marginal 60-70% stenosis and right coronary artery 40-50% stenosis. The EF was 20%.  He has also had PTA and stenting of his right iliac and left common iliac by Dr. Kirke Corin.  I did repeat an echo in 2015 and the EF was about 30% and was up to 40 - 45% in March 2017.  He is status post CEA.  However, follow up angiography demonstrates this to now be occluded on the left.  He has only mild disease on the right with some subclavian disease.    EF on echo in Sept 2023 was 35 - 40%.     Since I last saw him he has done okay.  He still goes to the grocery store.  He does a little trimming with his push mower.  He uses a weed [employer]. The patient denies any new symptoms such as chest discomfort, neck or arm discomfort. There has been no new shortness of breath, PND or orthopnea. There have been no reported palpitations, presyncope or syncope.   ROS: As stated in the HPI and negative for all other systems.  Studies Reviewed:    EKG:   EKG Interpretation Date/Time:  Wednesday June 27 2023 10:13:07 EDT Ventricular Rate:  55 PR Interval:  160 QRS Duration:  100 QT Interval:  440 QTC Calculation: 420 R Axis:   62  Text Interpretation: Sinus bradycardia Otherwise  normal ECG When compared with ECG of 07-Sep-2012 06:53, Premature atrial complexes are no longer Present Vent. rate has decreased BY  33 BPM Nonspecific T wave abnormality no longer evident in Inferior leads T wave inversion no longer evident in Lateral leads QT has shortened Confirmed by [name] ([id number]) on 06/27/2023 10:34:15 AM    Risk Assessment/Calculations:      Physical Exam:   VS:  BP (!) 160/74   Pulse (!) 55   Ht 5\' 2"  (1.575 m)   Wt 128 lb (58.1 kg)   BMI 23.41 kg/m    Wt Readings from Last 3 Encounters:  06/27/23 128 lb (58.1 kg)  06/13/23 128 lb 9.6 oz (58.3 kg)  02/16/23 127 lb (57.6 kg)     GEN: Well nourished, well developed in no acute distress NECK: No JVD; bilateral carotid carotid bruits CARDIAC: RRR, no murmurs, rubs, gallops RESPIRATORY:  Clear to auscultation without rales, wheezing or rhonchi  ABDOMEN: Soft, non-tender, non-distended EXTREMITIES:  No edema; No deformity   ASSESSMENT AND PLAN:   Cardiomyopathy He has not really tolerated a lot of med titration in the past as documented in previous notes but he is willing to try Cozaar 25 mg again today.  He will remain on the other meds as listed.  He is not having any shortness of breath at this point.  He has class  I symptoms.   Atrial fibrillation He has had no symptomatic paroxysms of this in years.  Alcohol was probably a trigger in the past and he does not drink.  CAD The patient has no new sypmtoms.  No further cardiovascular testing is indicated.  We will continue with aggressive risk reduction and meds as listed.   HTN The blood pressure is elevated and will be managed in the context of managing his cardiomyopathy as above.  I will check a basic metabolic profile in about 10 days.   He will keep a BP diary.   Carotid stenosis He status post CEA in the past that is occluded on the left.  He has right 60 to 79% stenosis and is followed by Dr.Brabham.   He has follow-up with him next week.      Dyslipidemia:  LDL was 56 with an HDL of 31.  No change in therapy.        Follow up with me in six months.   Signed, [name], MD

## 2023-06-27 ENCOUNTER — Other Ambulatory Visit: Payer: Self-pay | Admitting: *Deleted

## 2023-06-27 ENCOUNTER — Ambulatory Visit: Payer: Medicare Other | Admitting: Cardiology

## 2023-06-27 ENCOUNTER — Other Ambulatory Visit: Payer: Self-pay | Admitting: Cardiology

## 2023-06-27 ENCOUNTER — Encounter: Payer: Self-pay | Admitting: Cardiology

## 2023-06-27 VITALS — BP 160/74 | HR 55 | Ht 62.0 in | Wt 128.0 lb

## 2023-06-27 DIAGNOSIS — Z79899 Other long term (current) drug therapy: Secondary | ICD-10-CM

## 2023-06-27 DIAGNOSIS — I420 Dilated cardiomyopathy: Secondary | ICD-10-CM | POA: Diagnosis not present

## 2023-06-27 DIAGNOSIS — E785 Hyperlipidemia, unspecified: Secondary | ICD-10-CM

## 2023-06-27 DIAGNOSIS — I10 Essential (primary) hypertension: Secondary | ICD-10-CM

## 2023-06-27 DIAGNOSIS — I2510 Atherosclerotic heart disease of native coronary artery without angina pectoris: Secondary | ICD-10-CM

## 2023-06-27 DIAGNOSIS — I480 Paroxysmal atrial fibrillation: Secondary | ICD-10-CM

## 2023-06-27 MED ORDER — LOSARTAN POTASSIUM 25 MG PO TABS: 25.0000 mg | ORAL_TABLET | Freq: Every day | ORAL | 3 refills | Status: DC

## 2023-06-27 NOTE — Patient Instructions (Signed)
Medication Instructions:  Please start Losartan (Cozaar) 25 mg once a day. Continue all other medications as listed.  *If you need a refill on your cardiac medications before your next appointment, please call your pharmacy*   Lab Work: Please have blood work 10 days after you start Cozaar. (BMP) This can be completed at your closest [place].  If you have labs (blood work) drawn today and your tests are completely normal, you will receive your results only by: MyChart Message (if you have MyChart) OR A paper copy in the mail If you have any lab test that is abnormal or we need to change your treatment, we will call you to review the results.   Follow-Up: At [facility], you and your health needs are our priority.  As part of our continuing mission to provide you with exceptional heart care, we have created designated Provider Care Teams.  These Care Teams include your primary Cardiologist (physician) and Advanced Practice Providers (APPs -  Physician Assistants and Nurse Practitioners) who all work together to provide you with the care you need, when you need it.  We recommend signing up for the patient portal called "MyChart".  Sign up information is provided on this After Visit Summary.  MyChart is used to connect with patients for Virtual Visits (Telemedicine).  Patients are able to view lab/test results, encounter notes, upcoming appointments, etc.  Non-urgent messages can be sent to your provider as well.   To learn more about what you can do with MyChart, go to [email or web address].    Your next appointment:   6 month(s)  Provider:   [name], MD

## 2023-07-02 ENCOUNTER — Ambulatory Visit (HOSPITAL_COMMUNITY)
Admission: RE | Admit: 2023-07-02 | Discharge: 2023-07-02 | Disposition: A | Discharge status: Home / Self Care | Payer: Medicare Other | Source: Ambulatory Visit | Attending: Vascular Surgery | Admitting: Vascular Surgery

## 2023-07-02 ENCOUNTER — Encounter: Payer: Self-pay | Admitting: Physician Assistant

## 2023-07-02 ENCOUNTER — Ambulatory Visit: Payer: Medicare Other | Admitting: Physician Assistant

## 2023-07-02 VITALS — BP 136/78 | HR 65 | Temp 97.3°F | Resp 18 | Ht 62.0 in

## 2023-07-02 DIAGNOSIS — I6522 Occlusion and stenosis of left carotid artery: Secondary | ICD-10-CM

## 2023-07-02 DIAGNOSIS — I6521 Occlusion and stenosis of right carotid artery: Secondary | ICD-10-CM | POA: Diagnosis not present

## 2023-07-02 DIAGNOSIS — I6523 Occlusion and stenosis of bilateral carotid arteries: Secondary | ICD-10-CM | POA: Insufficient documentation

## 2023-07-02 NOTE — Progress Notes (Signed)
HISTORY AND PHYSICAL     CC:  follow up. Requesting Provider:  Gwenlyn Fudge, FNP  HPI: This is a 77 y.o. male here for follow up for carotid artery stenosis.  Pt is s/p left CEA and resection of CCA with primary anastomosis for asymptomatic carotid artery stenosis on 12/03/2014 by Dr. Myra Gianotti that has since occluded.    Pt was last seen 01/01/2023 and at that time pt did not have any neurological sx.  He was not having any claudication, rest pain or non healing wounds.   Pt returns today for follow up.    Pt denies any amaurosis fugax, speech difficulties, weakness, numbness, paralysis or clumsiness or facial droop.  He denies any claudication or pain in his feet or non healing ulcers.  He states he was having some right hip pain and it was injected by ortho and he states it has done ok.  He uses Biofreeze and it helps as well.  He states he started taking a new blood pressure medicine over the weekend (Losartan).  He states that he has not smoked in almost 11 years.  The pt is on a statin for cholesterol management.  The pt is on a daily aspirin.   Other AC:  none The pt is on BB, ARB, diuretic for hypertension.   The pt is not on medication for diabetes Tobacco hx:  former  Pt does not have family hx of AAA.  Past Medical History:  Diagnosis Date   Arthritis    CAD (coronary artery disease) nonobstructive   a. 10/2012 Cath: LM 50, LAD 25p, 87m, Diag 42m, LCX 30p, 40d, OM1 60-70, OM3 30, RCA 40-20m, AM 70p, PDA nl, EF 20%. c.  Stents to RCA circumvlex  06/2017 in [place].     Cardiomyopathy, dilated (HCC)    a. 09/2012 Echo: EF 15-20%, diff HK, gr2 DD, Triv AI, Mod MR, mild TR/PR, mod-sev dil LA.   CHF (congestive heart failure) (HCC)    Diverticulitis    Essential hypertension 10/16/2019   ETOH abuse    Quit 1998   GERD (gastroesophageal reflux disease)    rarely (worse when he was drinking)   Itchy skin    Myocardial infarction (HCC)    PAF (paroxysmal atrial  fibrillation) (HCC)    PVD (peripheral vascular disease) (HCC)    a. 05/2013 s/p PTA/self-expanding stenting of the REIA and LCIA.   Retinal hole of right eye    Shortness of breath dyspnea    with exertion   Tobacco abuse    Quit Dec 2012    Past Surgical History:  Procedure Laterality Date   ABDOMINAL AORTAGRAM N/A 05/28/2013   Procedure: ABDOMINAL AORTAGRAM;  Surgeon: Iran Ouch, MD;  Location: [facility] CATH LAB;  Service: Cardiovascular;  Laterality: N/A;   CAROTID ANGIOGRAM Bilateral 10/27/2014   Procedure: CAROTID ANGIOGRAM;  Surgeon: Nada Libman, MD;  Location: [facility] CATH LAB;  Service: Cardiovascular;  Laterality: Bilateral;   CAROTID ENDARTERECTOMY Left December 03, 2014   CEA   ENDARTERECTOMY Left 12/03/2014   Procedure: ENDARTERECTOMY CAROTID WITH PATCH ANGIOPLASTY;  Surgeon: Nada Libman, MD;  Location: [facility] OR;  Service: Vascular;  Laterality: Left;   EYE SURGERY Right    cataract surgery with lens implant   Neck cyst resection     PERCUTANEOUS STENT INTERVENTION Right 05/28/2013   Procedure: PERCUTANEOUS STENT INTERVENTION;  Surgeon: Iran Ouch, MD;  Location: [facility] CATH LAB;  Service: Cardiovascular;  Laterality: Right;   VASECTOMY  No Known Allergies  Current Outpatient Medications  Medication Sig Dispense Refill   aspirin EC 81 MG tablet Take 1 tablet (81 mg total) by mouth daily.     atorvastatin (LIPITOR) 40 MG tablet TAKE 1 TABLET BY MOUTH DAILY 90 tablet 0   carvedilol (COREG) 12.5 MG tablet TAKE 1 TABLET BY MOUTH TWICE  DAILY 180 tablet 3   fluticasone (FLONASE) 50 MCG/ACT nasal spray Place 2 sprays into both nostrils daily. 16 g 6   furosemide (LASIX) 20 MG tablet TAKE 1 TABLET BY MOUTH ON MONDAY WEDNESDAY AND FRIDAY 90 tablet 0   losartan (COZAAR) 25 MG tablet Take 1 tablet (25 mg total) by mouth daily. 90 tablet 3   nitroGLYCERIN (NITROSTAT) 0.4 MG SL tablet Place 1 tablet (0.4 mg total) under the tongue every 5 (five) minutes as needed for chest pain.  25 tablet 4   NON FORMULARY Colloidal silver 1 oz every morning.     spironolactone (ALDACTONE) 25 MG tablet TAKE 1 TABLET BY MOUTH ON  MONDAY, WEDNESDAY, AND FRIDAY 39 tablet 3   No current facility-administered medications for this visit.    Family History  Problem Relation Age of Onset   Diabetes Mother    Diabetes Father    Diabetes Brother    COPD Brother    Breast cancer Sister 66   Diabetes Brother     Social History   Socioeconomic History   Marital status: Widowed    Spouse name: Not on file   Number of children: Not on file   Years of education: Not on file   Highest education level: Not on file  Occupational History   Occupation: Retired  Tobacco Use   Smoking status: Former    Current packs/day: 0.00    Average packs/day: 1 pack/day for 49.0 years (49.0 ttl pk-yrs)    Types: Cigarettes    Start date: 08/30/2012    Quit date: 11/24/2012    Years since quitting: 10.6   Smokeless tobacco: Former    Types: Snuff  Substance and Sexual Activity   Alcohol use: No    Alcohol/week: 0.0 standard drinks of alcohol    Comment: Former ETOH.  None since 1998 Prior to 1998 was a heavy drinker   Drug use: No   Sexual activity: Not on file  Other Topics Concern   Not on file  Social History Narrative   Not on file   Social Determinants of Health   Financial Resource Strain: Not on file  Food Insecurity: Not on file  Transportation Needs: Not on file  Physical Activity: Not on file  Stress: Not on file  Social Connections: Not on file  Intimate Partner Violence: Not on file     REVIEW OF SYSTEMS:   [X]  denotes positive finding, [ ]  denotes negative finding Cardiac  Comments:  Chest pain or chest pressure:    Shortness of breath upon exertion:    Short of breath when lying flat:    Irregular heart rhythm:        Vascular    Pain in calf, thigh, or hip brought on by ambulation:    Pain in feet at night that wakes you up from your sleep:     Blood clot in  your veins:    Leg swelling:         Pulmonary    Oxygen at home:    Productive cough:     Wheezing:         Neurologic  Sudden weakness in arms or legs:     Sudden numbness in arms or legs:     Sudden onset of difficulty speaking or slurred speech:    Temporary loss of vision in one eye:     Problems with dizziness:         Gastrointestinal    Blood in stool:     Vomited blood:         Genitourinary    Burning when urinating:     Blood in urine:        Psychiatric    Major depression:         Hematologic    Bleeding problems:    Problems with blood clotting too easily:        Skin    Rashes or ulcers:        Constitutional    Fever or chills:      PHYSICAL EXAMINATION:  Today's Vitals   07/02/23 1241  BP: 136/78  Pulse: 65  Resp: 18  Temp: (!) 97.3 F (36.3 C)  SpO2: 93%  Height: 5\' 2"  (1.575 m)  PainSc: 0-No pain   Body mass index is 23.41 kg/m.   General:  WDWN in NAD; vital signs documented above Gait: Not observed HENT: WNL, normocephalic Pulmonary: normal non-labored breathing Cardiac: regular HR, without carotid bruits Abdomen: soft, NT; aortic pulse is not palpable Skin: without rashes Vascular Exam/Pulses:  Right Left  Radial 2+ (normal) 2+ (normal)   Extremities: without open wounds Musculoskeletal: no muscle wasting or atrophy  Neurologic: A&O X 3; moving all extremities equally; speech is fluent/normal Psychiatric:  The pt has Normal affect.   Non-Invasive Vascular Imaging:   Carotid Duplex on 07/02/2023 Right:  40-59% ICA stenosis Left:  occluded   Previous Carotid duplex on 01/01/2023: Right: 40-59% ICA stenosis Left:   occluded    ASSESSMENT/PLAN:: 77 y.o. male here for follow up carotid artery stenosis and left CEA and resection of CCA with primary anastomosis for asymptomatic carotid artery stenosis on 12/03/2014 by Dr. Myra Gianotti that has since occluded.    -duplex today reveals right ICA stenosis of 40-59% and this  is in the high range.  -discussed s/s of stroke with pt and he understands should he develop any of these sx, he will go to the nearest ER or call 911. -given the stenosis remains in the 40-59% range and is high end, pt will f/u in 9 months with carotid duplex -pt will call sooner should he have any issues. -continue statin/asa    Doreatha Massed, [facility] [phone]  Clinic MD:  Karin Lieu on call MD

## 2023-07-09 ENCOUNTER — Other Ambulatory Visit: Payer: Medicare Other

## 2023-07-10 LAB — BASIC METABOLIC PANEL
BUN/Creatinine Ratio: 19 (ref 10–24)
BUN: 19 mg/dL (ref 8–27)
CO2: 24 mmol/L (ref 20–29)
Calcium: 9.5 mg/dL (ref 8.6–10.2)
Chloride: 102 mmol/L (ref 96–106)
Creatinine, Ser: 1 mg/dL (ref 0.76–1.27)
Glucose: 97 mg/dL (ref 70–99)
Potassium: 4.6 mmol/L (ref 3.5–5.2)
Sodium: 140 mmol/L (ref 134–144)
eGFR: 77 mL/min/{1.73_m2} (ref >59)

## 2023-07-27 ENCOUNTER — Other Ambulatory Visit: Payer: Self-pay

## 2023-07-27 DIAGNOSIS — I6523 Occlusion and stenosis of bilateral carotid arteries: Secondary | ICD-10-CM

## 2023-08-22 ENCOUNTER — Other Ambulatory Visit: Payer: Self-pay | Admitting: Family Medicine

## 2023-08-22 DIAGNOSIS — I2510 Atherosclerotic heart disease of native coronary artery without angina pectoris: Secondary | ICD-10-CM

## 2023-08-22 DIAGNOSIS — I6523 Occlusion and stenosis of bilateral carotid arteries: Secondary | ICD-10-CM

## 2023-08-22 DIAGNOSIS — E782 Mixed hyperlipidemia: Secondary | ICD-10-CM

## 2023-08-22 DIAGNOSIS — I739 Peripheral vascular disease, unspecified: Secondary | ICD-10-CM

## 2023-09-06 ENCOUNTER — Other Ambulatory Visit: Payer: Self-pay | Admitting: Cardiology

## 2023-09-21 ENCOUNTER — Other Ambulatory Visit: Payer: Self-pay | Admitting: Cardiology

## 2023-12-31 ENCOUNTER — Other Ambulatory Visit: Payer: Medicare Other

## 2023-12-31 DIAGNOSIS — R972 Elevated prostate specific antigen [PSA]: Secondary | ICD-10-CM

## 2024-01-01 LAB — PSA, TOTAL AND FREE
PSA, Free Pct: 12.6 %
PSA, Free: 0.48 ng/mL
Prostate Specific Ag, Serum: 3.8 ng/mL (ref 0.0–4.0)

## 2024-01-07 ENCOUNTER — Ambulatory Visit: Payer: Medicare Other | Admitting: Urology

## 2024-01-08 ENCOUNTER — Other Ambulatory Visit: Payer: Medicare Other

## 2024-01-08 NOTE — Progress Notes (Unsigned)
 Cardiology Office Note:   Date:  01/09/2024  ID:  Ronnie Harvey, DOB 06/08/1946, MRN [id number] PCP: Galvin Jules, FNP  [facility] Providers Cardiologist:  None {  History of Present Illness:   Ronnie Harvey is a 78 y.o. male who presents for followup of cardiomyopathy. He was seen in consultation by us  at the time of diverticulitis and bowel microperforation a few years ago. He had atrial fibrillation as part of this hospitalization. He had respiratory failure and required intubation. His ejection fraction was 20%.  He was not anticoagulated at that time because of his ongoing acute issues. Cardiac catheterization demonstrated left main 50% stenosis, LAD 40% stenosis, circumflex 30% followed by distal 40% stenosis, small obtuse marginal 60-70% stenosis and right coronary artery 40-50% stenosis. The EF was 20%.  He has also had PTA and stenting of his right iliac and left common iliac by Dr. Alvenia Aus.  I did repeat an echo in 2015 and the EF was about 30% and was up to 40 - 45% in March 2017.  He is status post CEA.  However, follow up angiography demonstrates this to now be occluded on the left.  He has only mild disease on the right with some subclavian disease.    EF on echo in Sept 2023 was 35 - 40%.     Since I last saw him he has had no new cardiovascular symptoms.  He is not particularly active although he did cut up a tree in his backyard recently.  He is doing some 5 pound arm weights. The patient denies any new symptoms such as chest discomfort, neck or arm discomfort. There has been no new shortness of breath, PND or orthopnea. There have been no reported palpitations, presyncope or syncope.  He has been okay on the low-dose of ARB that I prescribed.  He has been reluctant to do this because of low blood pressures in the past.    ROS: As stated in the HPI and negative for all other systems.  Studies Reviewed:    EKG:        Risk Assessment/Calculations:    CHA2DS2-VASc  Score = 5   This indicates a 7.2% annual risk of stroke. The patient's score is based upon:       Physical Exam:   VS:  BP 126/62   Pulse 64   Ht 5\' 2"  (1.575 m)   Wt 130 lb (59 kg)   BMI 23.78 kg/m    Wt Readings from Last 3 Encounters:  01/09/24 130 lb (59 kg)  06/27/23 128 lb (58.1 kg)  06/13/23 128 lb 9.6 oz (58.3 kg)     GEN: Well nourished, well developed in no acute distress NECK: No JVD; No carotid bruits CARDIAC: RRR, soft apical systolic murmur nonradiating, no diastolic murmurs, rubs, gallops RESPIRATORY:  Clear to auscultation without rales, wheezing or rhonchi  ABDOMEN: Soft, non-tender, non-distended EXTREMITIES:  No edema; No deformity   ASSESSMENT AND PLAN:   Cardiomyopathy He has not tolerated med titration in the past.  He did agree to continue Cozaar .  He has tolerated this.  No change in therapy otherwise.    Atrial fibrillation He has had no symptomatic paroxysms.  This was probably related to alcohol in the past.  No change in therapy.  CAD The patient has no new sypmtoms.  No further cardiovascular testing is indicated.  We will continue with aggressive risk reduction and meds as listed.   HTN The blood pressure  is at target.  No change in therapy.   Carotid stenosis He status post CEA in the past that is occluded on the left.  He has right 60 to 79% stenosis and is followed by Dr.Brabham.     Dyslipidemia:  LDL was 56 in September of last year.  He will continue the meds as listed.         Follow up with me in one year.   Signed, Eilleen Grates, MD

## 2024-01-09 ENCOUNTER — Ambulatory Visit: Payer: Medicare Other | Admitting: Cardiology

## 2024-01-09 ENCOUNTER — Ambulatory Visit: Payer: Medicare Other | Admitting: Urology

## 2024-01-09 ENCOUNTER — Encounter: Payer: Self-pay | Admitting: Cardiology

## 2024-01-09 VITALS — BP 146/66 | HR 66

## 2024-01-09 VITALS — BP 126/62 | HR 64 | Ht 62.0 in | Wt 130.0 lb

## 2024-01-09 DIAGNOSIS — I10 Essential (primary) hypertension: Secondary | ICD-10-CM

## 2024-01-09 DIAGNOSIS — I480 Paroxysmal atrial fibrillation: Secondary | ICD-10-CM

## 2024-01-09 DIAGNOSIS — R972 Elevated prostate specific antigen [PSA]: Secondary | ICD-10-CM

## 2024-01-09 DIAGNOSIS — N138 Other obstructive and reflux uropathy: Secondary | ICD-10-CM | POA: Diagnosis not present

## 2024-01-09 DIAGNOSIS — I420 Dilated cardiomyopathy: Secondary | ICD-10-CM | POA: Diagnosis not present

## 2024-01-09 DIAGNOSIS — N401 Enlarged prostate with lower urinary tract symptoms: Secondary | ICD-10-CM

## 2024-01-09 DIAGNOSIS — I6523 Occlusion and stenosis of bilateral carotid arteries: Secondary | ICD-10-CM

## 2024-01-09 DIAGNOSIS — R351 Nocturia: Secondary | ICD-10-CM

## 2024-01-09 DIAGNOSIS — E785 Hyperlipidemia, unspecified: Secondary | ICD-10-CM

## 2024-01-09 LAB — URINALYSIS, ROUTINE W REFLEX MICROSCOPIC
Bilirubin, UA: NEGATIVE
Glucose, UA: NEGATIVE
Ketones, UA: NEGATIVE
Leukocytes,UA: NEGATIVE
Nitrite, UA: NEGATIVE
Protein,UA: NEGATIVE
RBC, UA: NEGATIVE
Specific Gravity, UA: <1.005 — ABNORMAL LOW (ref 1.005–1.030)
Urobilinogen, Ur: 0.2 mg/dL (ref 0.2–1.0)
pH, UA: 6 (ref 5.0–7.5)

## 2024-01-09 NOTE — Progress Notes (Signed)
 01/09/2024 11:07 AM   Ronnie Harvey Jul 26, 1946 [id number]  Referring provider: Galvin Jules, FNP [address] [place],  [place] [address]  Followup BPH   HPI: Ronnie Harvey is a 78yo here for followup for elevated PSA and BPh with nocturia. PSA decreased to 3.8 from 4.3. IPSS 10 QOL 2 on no BPh therapy. Nocturia 1x with fluid management    PMH: Past Medical History:  Diagnosis Date   Arthritis    CAD (coronary artery disease) nonobstructive   a. 10/2012 Cath: LM 50, LAD 25p, 21m, Diag 28m, LCX 30p, 40d, OM1 60-70, OM3 30, RCA 40-25m, AM 70p, PDA nl, EF 20%. c.  Stents to RCA circumvlex  06/2017 in [place].     Cardiomyopathy, dilated (HCC)    a. 09/2012 Echo: EF 15-20%, diff HK, gr2 DD, Triv AI, Mod Ronnie, mild TR/PR, mod-sev dil LA.   CHF (congestive heart failure) (HCC)    Diverticulitis    Essential hypertension 10/16/2019   ETOH abuse    Quit 1998   GERD (gastroesophageal reflux disease)    rarely (worse when he was drinking)   Itchy skin    Myocardial infarction (HCC)    PAF (paroxysmal atrial fibrillation) (HCC)    PVD (peripheral vascular disease) (HCC)    a. 05/2013 s/p PTA/self-expanding stenting of the REIA and LCIA.   Retinal hole of right eye    Shortness of breath dyspnea    with exertion   Tobacco abuse    Quit Dec 2012    Surgical History: Past Surgical History:  Procedure Laterality Date   ABDOMINAL AORTAGRAM N/A 05/28/2013   Procedure: ABDOMINAL AORTAGRAM;  Surgeon: Wenona Hamilton, MD;  Location: [facility] CATH LAB;  Service: Cardiovascular;  Laterality: N/A;   CAROTID ANGIOGRAM Bilateral 10/27/2014   Procedure: CAROTID ANGIOGRAM;  Surgeon: Margherita Shell, MD;  Location: [facility] CATH LAB;  Service: Cardiovascular;  Laterality: Bilateral;   CAROTID ENDARTERECTOMY Left December 03, 2014   CEA   ENDARTERECTOMY Left 12/03/2014   Procedure: ENDARTERECTOMY CAROTID WITH PATCH ANGIOPLASTY;  Surgeon: Margherita Shell, MD;  Location: [facility] OR;  Service: Vascular;  Laterality:  Left;   EYE SURGERY Right    cataract surgery with lens implant   Neck cyst resection     PERCUTANEOUS STENT INTERVENTION Right 05/28/2013   Procedure: PERCUTANEOUS STENT INTERVENTION;  Surgeon: Wenona Hamilton, MD;  Location: [facility] CATH LAB;  Service: Cardiovascular;  Laterality: Right;   VASECTOMY      Home Medications:  Allergies as of 01/09/2024   No Known Allergies      Medication List        Accurate as of January 09, 2024 11:07 AM. If you have any questions, ask your nurse or doctor.          aspirin  EC 81 MG tablet Take 1 tablet (81 mg total) by mouth daily.   atorvastatin  40 MG tablet Commonly known as: LIPITOR TAKE 1 TABLET BY MOUTH DAILY   carvedilol  12.5 MG tablet Commonly known as: COREG  TAKE 1 TABLET BY MOUTH TWICE  DAILY   fluticasone  50 MCG/ACT nasal spray Commonly known as: FLONASE  Place 2 sprays into both nostrils daily.   furosemide  20 MG tablet Commonly known as: LASIX  TAKE 1 TABLET BY MOUTH ON MONDAY WEDNESDAY AND FRIDAY   losartan  25 MG tablet Commonly known as: COZAAR  Take 1 tablet (25 mg total) by mouth daily.   nitroGLYCERIN  0.4 MG SL tablet Commonly known as: NITROSTAT  Place 1 tablet (  0.4 mg total) under the tongue every 5 (five) minutes as needed for chest pain.   NON FORMULARY Colloidal silver 1 oz every morning.   spironolactone  25 MG tablet Commonly known as: ALDACTONE  TAKE 1 TABLET BY MOUTH ON  MONDAY, WEDNESDAY, AND FRIDAY        Allergies: No Known Allergies  Family History: Family History  Problem Relation Age of Onset   Diabetes Mother    Diabetes Father    Diabetes Brother    COPD Brother    Breast cancer Sister 8   Diabetes Brother     Social History:  reports that he quit smoking about 11 years ago. His smoking use included cigarettes. He started smoking about 11 years ago. He has a 49 pack-year smoking history. He has quit using smokeless tobacco.  His smokeless tobacco use included snuff. He reports that he  does not drink alcohol and does not use drugs.  ROS: All other review of systems were reviewed and are negative except what is noted above in HPI  Physical Exam: BP (!) 146/66   Pulse 66   Constitutional:  Alert and oriented, No acute distress. HEENT: [place] AT, moist mucus membranes.  Trachea midline, no masses. Cardiovascular: No clubbing, cyanosis, or edema. Respiratory: Normal respiratory effort, no increased work of breathing. GI: Abdomen is soft, nontender, nondistended, no abdominal masses GU: No CVA tenderness.  Lymph: No cervical or inguinal lymphadenopathy. Skin: No rashes, bruises or suspicious lesions. Neurologic: Grossly intact, no focal deficits, moving all 4 extremities. Psychiatric: Normal mood and affect.  Laboratory Data: Lab Results  Component Value Date   WBC 7.7 06/13/2023   HGB 14.6 06/13/2023   HCT 43.6 06/13/2023   MCV 96 06/13/2023   PLT 205 06/13/2023    Lab Results  Component Value Date   CREATININE 1.00 07/09/2023    No results found for: "PSA"  No results found for: "TESTOSTERONE"  Lab Results  Component Value Date   HGBA1C 5.7 (H) 09/07/2012    Urinalysis    Component Value Date/Time   COLORURINE YELLOW 11/25/2014 1058   APPEARANCEUR Clear 01/10/2023 1031   LABSPEC 1.010 11/25/2014 1058   PHURINE 5.5 11/25/2014 1058   GLUCOSEU Negative 01/10/2023 1031   HGBUR NEGATIVE 11/25/2014 1058   BILIRUBINUR Negative 01/10/2023 1031   KETONESUR NEGATIVE 11/25/2014 1058   PROTEINUR Negative 01/10/2023 1031   PROTEINUR NEGATIVE 11/25/2014 1058   UROBILINOGEN 0.2 11/25/2014 1058   NITRITE Negative 01/10/2023 1031   NITRITE NEGATIVE 11/25/2014 1058   LEUKOCYTESUR Negative 01/10/2023 1031    Lab Results  Component Value Date   LABMICR Comment 01/10/2023   WBCUA None seen 07/08/2021   LABEPIT None seen 07/08/2021   BACTERIA None seen 07/08/2021    Pertinent Imaging:  Results for orders placed in visit on 03/13/13  DG Abd 1  View  Narrative *RADIOLOGY REPORT*  Clinical Data: History of diarrhea.  ABDOMEN - 1 VIEW  Comparison: None.  Findings: There is obesity.  There is thoracolumbar scoliosis with upper curve convexity to the right and lower curve convexity to the left.  Multilevel marginal osteophyte formation is seen representing degenerative spondylosis.  There is narrowing of some of the intervertebral disc spaces as well.  There is mild gaseous distention of several loops of small intestine.  Colonic gas is present.  Left upper quadrant bowel gas may reflect a combination of stomach and colon.  No opaque calculi are seen.  IMPRESSION: History given of diarrhea.  Mild gaseous distention  of several loops of small intestine.  Colonic gas present.  Gas in left upper quadrant most likely reflects combination of stomach and colon. Air fluid levels cannot be evaluated without erect or decubitus examination.  Chronic bony changes are detailed above.  Clinically significant discrepancy from primary report, if provided: None   Original Report Authenticated By: Myrtie Atkinson Call  No results found for this or any previous visit.  No results found for this or any previous visit.  No results found for this or any previous visit.  No results found for this or any previous visit.  No results found for this or any previous visit.  No results found for this or any previous visit.  No results found for this or any previous visit.   Assessment & Plan:    1. Elevated PSA (Primary) Followup 1year with a PSA - Urinalysis, Routine w reflex microscopic  2. Benign prostatic hyperplasia with urinary obstruction Patient defers therapy at this time  3. Nocturia Decrease fluid intake within 2 hours of going to bed   No follow-ups on file.  Ronnie Nailer, MD  [facility] [place]

## 2024-01-09 NOTE — Patient Instructions (Signed)
 Medication Instructions:  The current medical regimen is effective;  continue present plan and medications.  *If you need a refill on your cardiac medications before your next appointment, please call your pharmacy*  Follow-Up: At [facility], you and your health needs are our priority.  As part of our continuing mission to provide you with exceptional heart care, our providers are all part of one team.  This team includes your primary Cardiologist (physician) and Advanced Practice Providers or APPs (Physician Assistants and Nurse Practitioners) who all work together to provide you with the care you need, when you need it.  Your next appointment:   1 year(s)  Provider:   [name], MD    We recommend signing up for the patient portal called "MyChart".  Sign up information is provided on this After Visit Summary.  MyChart is used to connect with patients for Virtual Visits (Telemedicine).  Patients are able to view lab/test results, encounter notes, upcoming appointments, etc.  Non-urgent messages can be sent to your provider as well.   To learn more about what you can do with MyChart, go to [email or web address].

## 2024-01-15 ENCOUNTER — Encounter: Payer: Self-pay | Admitting: Urology

## 2024-01-15 NOTE — Patient Instructions (Signed)

## 2024-04-01 ENCOUNTER — Other Ambulatory Visit: Payer: Self-pay

## 2024-04-01 DIAGNOSIS — I6523 Occlusion and stenosis of bilateral carotid arteries: Secondary | ICD-10-CM

## 2024-04-09 NOTE — Progress Notes (Signed)
 Office Note     CC:  follow up Requesting Provider:  Severa Rock HERO, FNP  HPI: Ronnie Harvey is a 78 y.o. (10-04-1945) male who presents for surveillance of carotid artery stenosis.  He underwent left carotid endarterectomy and resection of common carotid with primary anastomosis for asymptomatic high-grade ICA stenosis on 12/03/2014 by Dr. Serene.  This intervention has since occluded.  Since last office visit he denies any diagnosis of CVA or TIA.  He also denies any strokelike symptoms including slurring speech, changes in vision, or one-sided weakness.  He is on aspirin  and statin daily.  He is a former smoker.     Past Medical History:  Diagnosis Date   Arthritis    CAD (coronary artery disease) nonobstructive   a. 10/2012 Cath: LM 50, LAD 25p, 53m, Diag 15m, LCX 30p, 40d, OM1 60-70, OM3 30, RCA 40-32m, AM 70p, PDA nl, EF 20%. c.  Stents to RCA circumvlex  06/2017 in [place].     Cardiomyopathy, dilated (HCC)    a. 09/2012 Echo: EF 15-20%, diff HK, gr2 DD, Triv AI, Mod MR, mild TR/PR, mod-sev dil LA.   CHF (congestive heart failure) (HCC)    Diverticulitis    Essential hypertension 10/16/2019   ETOH abuse    Quit 1998   GERD (gastroesophageal reflux disease)    rarely (worse when he was drinking)   Itchy skin    Myocardial infarction (HCC)    PAF (paroxysmal atrial fibrillation) (HCC)    PVD (peripheral vascular disease) (HCC)    a. 05/2013 s/p PTA/self-expanding stenting of the REIA and LCIA.   Retinal hole of right eye    Shortness of breath dyspnea    with exertion   Tobacco abuse    Quit Dec 2012    Past Surgical History:  Procedure Laterality Date   ABDOMINAL AORTAGRAM N/A 05/28/2013   Procedure: ABDOMINAL AORTAGRAM;  Surgeon: Deatrice DELENA Cage, MD;  Location: [facility] CATH LAB;  Service: Cardiovascular;  Laterality: N/A;   CAROTID ANGIOGRAM Bilateral 10/27/2014   Procedure: CAROTID ANGIOGRAM;  Surgeon: Gaile LELON Serene, MD;  Location: [facility] CATH LAB;  Service: Cardiovascular;   Laterality: Bilateral;   CAROTID ENDARTERECTOMY Left December 03, 2014   CEA   ENDARTERECTOMY Left 12/03/2014   Procedure: ENDARTERECTOMY CAROTID WITH PATCH ANGIOPLASTY;  Surgeon: Gaile LELON Serene, MD;  Location: [facility] OR;  Service: Vascular;  Laterality: Left;   EYE SURGERY Right    cataract surgery with lens implant   Neck cyst resection     PERCUTANEOUS STENT INTERVENTION Right 05/28/2013   Procedure: PERCUTANEOUS STENT INTERVENTION;  Surgeon: Deatrice DELENA Cage, MD;  Location: [facility] CATH LAB;  Service: Cardiovascular;  Laterality: Right;   VASECTOMY      Social History   Socioeconomic History   Marital status: Widowed    Spouse name: Not on file   Number of children: Not on file   Years of education: Not on file   Highest education level: Not on file  Occupational History   Occupation: Retired  Tobacco Use   Smoking status: Former    Current packs/day: 0.00    Average packs/day: 1 pack/day for 49.0 years (49.0 ttl pk-yrs)    Types: Cigarettes    Start date: 08/30/2012    Quit date: 11/24/2012    Years since quitting: 11.3   Smokeless tobacco: Former    Types: Snuff  Substance and Sexual Activity   Alcohol use: No    Alcohol/week: 0.0 standard drinks of alcohol  Comment: Former ETOH.  None since 1998 Prior to 1998 was a heavy drinker   Drug use: No   Sexual activity: Not on file  Other Topics Concern   Not on file  Social History Narrative   Not on file   Social Drivers of Health   Financial Resource Strain: Not on file  Food Insecurity: Not on file  Transportation Needs: Not on file  Physical Activity: Not on file  Stress: Not on file  Social Connections: Not on file  Intimate Partner Violence: Not on file    Family History  Problem Relation Age of Onset   Diabetes Mother    Diabetes Father    Diabetes Brother    COPD Brother    Breast cancer Sister 93   Diabetes Brother     Current Outpatient Medications  Medication Sig Dispense Refill   aspirin  EC 81 MG  tablet Take 1 tablet (81 mg total) by mouth daily.     atorvastatin  (LIPITOR) 40 MG tablet TAKE 1 TABLET BY MOUTH DAILY 90 tablet 3   carvedilol  (COREG ) 12.5 MG tablet TAKE 1 TABLET BY MOUTH TWICE  DAILY 180 tablet 2   fluticasone  (FLONASE ) 50 MCG/ACT nasal spray Place 2 sprays into both nostrils daily. 16 g 6   furosemide  (LASIX ) 20 MG tablet TAKE 1 TABLET BY MOUTH ON MONDAY WEDNESDAY AND FRIDAY 36 tablet 2   losartan  (COZAAR ) 25 MG tablet Take 1 tablet (25 mg total) by mouth daily. 90 tablet 3   nitroGLYCERIN  (NITROSTAT ) 0.4 MG SL tablet Place 1 tablet (0.4 mg total) under the tongue every 5 (five) minutes as needed for chest pain. 25 tablet 4   NON FORMULARY Colloidal silver 1 oz every morning.     spironolactone  (ALDACTONE ) 25 MG tablet TAKE 1 TABLET BY MOUTH ON  MONDAY, WEDNESDAY, AND FRIDAY 39 tablet 3   No current facility-administered medications for this visit.    No Known Allergies   REVIEW OF SYSTEMS:   [X]  denotes positive finding, [ ]  denotes negative finding Cardiac  Comments:  Chest pain or chest pressure:    Shortness of breath upon exertion:    Short of breath when lying flat:    Irregular heart rhythm:        Vascular    Pain in calf, thigh, or hip brought on by ambulation:    Pain in feet at night that wakes you up from your sleep:     Blood clot in your veins:    Leg swelling:         Pulmonary    Oxygen  at home:    Productive cough:     Wheezing:         Neurologic    Sudden weakness in arms or legs:     Sudden numbness in arms or legs:     Sudden onset of difficulty speaking or slurred speech:    Temporary loss of vision in one eye:     Problems with dizziness:         Gastrointestinal    Blood in stool:     Vomited blood:         Genitourinary    Burning when urinating:     Blood in urine:        Psychiatric    Major depression:         Hematologic    Bleeding problems:    Problems with blood clotting too easily:  Skin    Rashes  or ulcers:        Constitutional    Fever or chills:      PHYSICAL EXAMINATION:  Vitals:   04/14/24 1236 04/14/24 1238  BP: 134/70 (!) 146/80  Pulse: 64   Temp: 98 F (36.7 C)   TempSrc: Temporal   Weight: 129 lb 4.8 oz (58.7 kg)     General:  WDWN in NAD; vital signs documented above Gait: Not observed HENT: WNL, normocephalic Pulmonary: normal non-labored breathing Cardiac: regular HR Abdomen: soft, NT, no masses Skin: without rashes Vascular Exam/Pulses: L > R radial pulse Extremities: without ischemic changes, without Gangrene , without cellulitis; without open wounds;  Musculoskeletal: no muscle wasting or atrophy  Neurologic: A&O X 3; CN grossly intact Psychiatric:  The pt has Normal affect.   Non-Invasive Vascular Imaging:   R ICA 60-79% stenosis L ICA occluded    ASSESSMENT/PLAN:: 78 y.o. male here for follow up for surveillance of carotid artery stenosis   Mr. Ly is a 78 year old male who returns for carotid artery stenosis surveillance.  He has a known left ICA occlusion after endarterectomy.  Right ICA stenosis estimated to be between 60 and 79% which has increased from prior office visit.  No indication for revascularization of asymptomatic right ICA stenosis at this time.  We will repeat carotid duplex in 6 months in the [place] office.  He will continue his aspirin  and statin daily as well as regular follow-up with his PCP for management of chronic medical conditions including hypertension and hyperlipidemia.   Donnice Sender, PA-C [facility] [phone]  Clinic MD:   [place] on call

## 2024-04-14 ENCOUNTER — Ambulatory Visit (HOSPITAL_COMMUNITY)
Admission: RE | Admit: 2024-04-14 | Discharge: 2024-04-14 | Disposition: A | Discharge status: Home / Self Care | Source: Ambulatory Visit | Attending: Surgery | Admitting: Surgery

## 2024-04-14 ENCOUNTER — Ambulatory Visit: Admitting: Physician Assistant

## 2024-04-14 VITALS — BP 146/80 | HR 64 | Temp 98.0°F | Wt 129.3 lb

## 2024-04-14 DIAGNOSIS — I6523 Occlusion and stenosis of bilateral carotid arteries: Secondary | ICD-10-CM

## 2024-04-16 ENCOUNTER — Other Ambulatory Visit: Payer: Self-pay | Admitting: *Deleted

## 2024-04-16 ENCOUNTER — Other Ambulatory Visit: Payer: Self-pay | Admitting: Cardiology

## 2024-04-16 DIAGNOSIS — I6523 Occlusion and stenosis of bilateral carotid arteries: Secondary | ICD-10-CM

## 2024-04-30 ENCOUNTER — Other Ambulatory Visit: Payer: Self-pay | Admitting: Cardiology

## 2024-06-27 ENCOUNTER — Ambulatory Visit (INDEPENDENT_AMBULATORY_CARE_PROVIDER_SITE_OTHER)

## 2024-06-27 VITALS — BP 139/70 | HR 65 | Temp 97.0°F | Ht 62.0 in | Wt 130.2 lb

## 2024-06-27 DIAGNOSIS — Z23 Encounter for immunization: Secondary | ICD-10-CM

## 2024-06-27 DIAGNOSIS — Z0000 Encounter for general adult medical examination without abnormal findings: Secondary | ICD-10-CM

## 2024-06-27 DIAGNOSIS — Z122 Encounter for screening for malignant neoplasm of respiratory organs: Secondary | ICD-10-CM

## 2024-06-27 NOTE — Patient Instructions (Signed)
 Mr. Feggins,  Thank you for taking the time for your Medicare Wellness Visit. I appreciate your continued commitment to your health goals. Please review the care plan we discussed, and feel free to reach out if I can assist you further.  Medicare recommends these wellness visits once per year to help you and your care team stay ahead of potential health issues. These visits are designed to focus on prevention, allowing your provider to concentrate on managing your acute and chronic conditions during your regular appointments.  Please note that Annual Wellness Visits do not include a physical exam. Some assessments may be limited, especially if the visit was conducted virtually. If needed, we may recommend a separate in-person follow-up with your provider.  Ongoing Care Seeing your primary care provider every 3 to 6 months helps us  monitor your health and provide consistent, personalized care.   Referrals If a referral was made during today's visit and you haven't received any updates within two weeks, please contact the referred provider directly to check on the status.  Recommended Screenings:  Health Maintenance  Topic Date Due   Medicare Annual Wellness Visit  Never done   Screening for Lung Cancer  02/14/2024   Flu Shot  04/18/2024   COVID-19 Vaccine (4 - 2025-26 season) 05/19/2024   DTaP/Tdap/Td vaccine (2 - Td or Tdap) 10/15/2029   Pneumococcal Vaccine for age over 58  Completed   Hepatitis C Screening  Completed   Zoster (Shingles) Vaccine  Completed   Meningitis B Vaccine  Aged Out       06/27/2024    9:20 AM  Advanced Directives  Does Patient Have a Medical Advance Directive? No   Advance Care Planning is important because it: Ensures you receive medical care that aligns with your values, goals, and preferences. Provides guidance to your family and loved ones, reducing the emotional burden of decision-making during critical moments.  Vision: Annual vision screenings are  recommended for early detection of glaucoma, cataracts, and diabetic retinopathy. These exams can also reveal signs of chronic conditions such as diabetes and high blood pressure.  Dental: Annual dental screenings help detect early signs of oral cancer, gum disease, and other conditions linked to overall health, including heart disease and diabetes.  Please see the attached documents for additional preventive care recommendations.

## 2024-06-27 NOTE — Progress Notes (Signed)
 Subjective:   Ronnie Harvey is a 78 y.o. who presents for a Medicare Wellness preventive visit.  As a reminder, Annual Wellness Visits don't include a physical exam, and some assessments may be limited, especially if this visit is performed virtually. We may recommend an in-person follow-up visit with your provider if needed.  Visit Complete: Virtual I connected with  Ronnie Harvey on 06/27/24 by a audio enabled telemedicine application and verified that I am speaking with the correct person using two identifiers.  Patient Location: In office  Provider location: in office I discussed the limitations of evaluation and management by telemedicine. The patient expressed understanding and agreed to proceed.  Vital Signs: Because this visit was a virtual/telehealth visit, some criteria may be missing or patient reported. Any vitals not documented were not able to be obtained and vitals that have been documented are patient reported.  VideoDeclined- This patient declined Librarian, academic. Therefore the visit was completed with audio only.  Persons Participating in Visit: Patient.  AWV Questionnaire: No: Patient Medicare AWV questionnaire was not completed prior to this visit.  Cardiac Risk Factors include: advanced age (>38men, >30 women);dyslipidemia;hypertension;male gender;smoking/ tobacco exposure     Objective:    Today's Vitals   06/27/24 0906  BP: 139/70  Pulse: 65  Temp: (!) 97 F (36.1 C)  TempSrc: Temporal  Weight: 130 lb 3.2 oz (59.1 kg)  Height: 5' 2 (1.575 m)   Body mass index is 23.81 kg/m.     06/27/2024    9:20 AM 01/24/2016   10:43 AM 07/05/2015   11:26 AM 12/28/2014    1:33 PM 11/25/2014   10:32 AM 10/27/2014    6:15 AM 09/04/2012    4:00 AM  Advanced Directives  Does Patient Have a Medical Advance Directive? No No  No  No  No  No  Patient does not have advance directive   Would patient like information on creating a medical  advance directive?  No - patient declined information  No - patient declined information  No - patient declined information  No - patient declined information  No - patient declined information    Pre-existing out of facility DNR order (yellow form or pink MOST form)       No      Data saved with a previous flowsheet row definition    Current Medications (verified) Outpatient Encounter Medications as of 06/27/2024  Medication Sig   aspirin  EC 81 MG tablet Take 1 tablet (81 mg total) by mouth daily.   atorvastatin  (LIPITOR) 40 MG tablet TAKE 1 TABLET BY MOUTH DAILY   carvedilol  (COREG ) 12.5 MG tablet TAKE 1 TABLET BY MOUTH TWICE  DAILY   furosemide  (LASIX ) 20 MG tablet TAKE 1 TABLET BY MOUTH ON MONDAY WEDNESDAY AND FRIDAY   losartan  (COZAAR ) 25 MG tablet TAKE 1 TABLET BY MOUTH DAILY   nitroGLYCERIN  (NITROSTAT ) 0.4 MG SL tablet Place 1 tablet (0.4 mg total) under the tongue every 5 (five) minutes as needed for chest pain.   NON FORMULARY Colloidal silver 1 oz every morning.   spironolactone  (ALDACTONE ) 25 MG tablet TAKE 1 TABLET BY MOUTH ON  MONDAY, WEDNESDAY, AND FRIDAY   fluticasone  (FLONASE ) 50 MCG/ACT nasal spray Place 2 sprays into both nostrils daily. (Patient not taking: Reported on 06/27/2024)   No facility-administered encounter medications on file as of 06/27/2024.    Allergies (verified) Patient has no known allergies.   History: Past Medical History:  Diagnosis Date  Arthritis    CAD (coronary artery disease) nonobstructive   a. 10/2012 Cath: LM 50, LAD 25p, 65m, Diag 41m, LCX 30p, 40d, OM1 60-70, OM3 30, RCA 40-60m, AM 70p, PDA nl, EF 20%. c.  Stents to RCA circumvlex  06/2017 in [place].     Cardiomyopathy, dilated (HCC)    a. 09/2012 Echo: EF 15-20%, diff HK, gr2 DD, Triv AI, Mod MR, mild TR/PR, mod-sev dil LA.   CHF (congestive heart failure) (HCC)    Diverticulitis    Essential hypertension 10/16/2019   ETOH abuse    Quit 1998   GERD (gastroesophageal reflux  disease)    rarely (worse when he was drinking)   Itchy skin    Myocardial infarction (HCC)    PAF (paroxysmal atrial fibrillation) (HCC)    PVD (peripheral vascular disease)    a. 05/2013 s/p PTA/self-expanding stenting of the REIA and LCIA.   Retinal hole of right eye    Shortness of breath dyspnea    with exertion   Tobacco abuse    Quit Dec 2012   Past Surgical History:  Procedure Laterality Date   ABDOMINAL AORTAGRAM N/A 05/28/2013   Procedure: ABDOMINAL AORTAGRAM;  Surgeon: Ronnie DELENA Cage, MD;  Location: [facility] CATH LAB;  Service: Cardiovascular;  Laterality: N/A;   CAROTID ANGIOGRAM Bilateral 10/27/2014   Procedure: CAROTID ANGIOGRAM;  Surgeon: Ronnie LELON New, MD;  [name] CATH LAB;  Service: Cardiovascular;  Laterality: Bilateral;   CAROTID ENDARTERECTOMY Left December 03, 2014   CEA   ENDARTERECTOMY Left 12/03/2014   Procedure: ENDARTERECTOMY CAROTID WITH PATCH ANGIOPLASTY;  Surgeon: Ronnie LELON New, MD;  Location: [facility] OR;  Service: Vascular;  Laterality: Left;   EYE SURGERY Right    cataract surgery with lens implant   Neck cyst resection     PERCUTANEOUS STENT INTERVENTION Right 05/28/2013   Procedure: PERCUTANEOUS STENT INTERVENTION;  Surgeon: Ronnie DELENA Cage, MD;  Location: [facility] CATH LAB;  Service: Cardiovascular;  Laterality: Right;   VASECTOMY     Family History  Problem Relation Age of Onset   Diabetes Mother    Diabetes Father    Diabetes Brother    COPD Brother    Breast cancer Sister 13   Diabetes Brother    Social History   Socioeconomic History   Marital status: Widowed    Spouse name: Not on file   Number of children: Not on file   Years of education: Not on file   Highest education level: Not on file  Occupational History   Occupation: Retired  Tobacco Use   Smoking status: Former    Current packs/day: 0.00    Average packs/day: 1 pack/day for 49.0 years (49.0 ttl pk-yrs)    Types: Cigarettes    Start date: 08/30/2012    Quit date: 11/24/2012     Years since quitting: 11.5   Smokeless tobacco: Former    Types: Snuff  Substance and Sexual Activity   Alcohol use: No    Alcohol/week: 0.0 standard drinks of alcohol    Comment: Former ETOH.  None since 1998 Prior to 1998 was a heavy drinker   Drug use: No   Sexual activity: Not on file  Other Topics Concern   Not on file  Social History Narrative   Not on file   Social Drivers of Health   Financial Resource Strain: Low Risk  (06/27/2024)   Overall Financial Resource Strain (CARDIA)    Difficulty of Paying Living Expenses: Not hard at all  Food  Insecurity: No Food Insecurity (06/27/2024)   Hunger Vital Sign    Worried About Running Out of Food in the Last Year: Never true    Ran Out of Food in the Last Year: Never true  Transportation Needs: No Transportation Needs (06/27/2024)   PRAPARE - Administrator, Civil Service (Medical): No    Lack of Transportation (Non-Medical): No  Physical Activity: Inactive (06/27/2024)   Exercise Vital Sign    Days of Exercise per Week: 0 days    Minutes of Exercise per Session: 0 min  Stress: No Stress Concern Present (06/27/2024)   [employer] of Occupational Health - Occupational Stress Questionnaire    Feeling of Stress: Not at all  Social Connections: Socially Isolated (06/27/2024)   Social Connection and Isolation Panel    Frequency of Communication with Friends and Family: Three times a week    Frequency of Social Gatherings with Friends and Family: Never    Attends Religious Services: Never    Database administrator or Organizations: No    Attends Banker Meetings: Never    Marital Status: Widowed    Tobacco Counseling Counseling given: Yes    Clinical Intake:  Pre-visit preparation completed: Yes  Pain : No/denies pain     BMI - recorded: 23.81 Nutritional Status: BMI of 19-24  Normal Nutritional Risks: None Diabetes: No  Lab Results  Component Value Date   HGBA1C 5.7 (H)  09/07/2012     How often do you need to have someone help you when you read instructions, pamphlets, or other written materials from your doctor or pharmacy?: 1 - Never  Interpreter Needed?: No  Information entered by :: alia t/cma   Activities of Daily Living     06/27/2024    9:01 AM  In your present state of health, do you have any difficulty performing the following activities:  Hearing? 0  Vision? 0  Difficulty concentrating or making decisions? 0  Walking or climbing stairs? 0  Dressing or bathing? 0  Doing errands, shopping? 0  Preparing Food and eating ? N  Using the Toilet? N  In the past six months, have you accidently leaked urine? N  Do you have problems with loss of bowel control? N  Managing your Medications? N  Managing your Finances? N  Housekeeping or managing your Housekeeping? N    Patient Care Team: Severa Rock HERO, FNP as PCP - General (Family Medicine) Lavona Agent, MD as Consulting Physician (Cardiology) Gladis Mustard, FNP as Nurse Practitioner (Family Medicine) Darron Ronnie LABOR, MD as Consulting Physician (Cardiology)  I have updated your Care Teams any recent Medical Services you may have received from other providers in the past year.     Assessment:   This is a routine wellness examination for Ronnie Harvey.  Hearing/Vision screen Hearing Screening - Comments:: Pt denies hearing dif Vision Screening - Comments:: Pt wear glasses/pt goes to [facility] w/Dr. Vicci in [place]/last ov upcoming apt 11/25   Goals Addressed   None    Depression Screen     06/27/2024    9:21 AM 02/06/2023   12:11 PM 05/18/2022    1:03 PM 03/22/2021   11:04 AM 04/14/2020   10:09 AM 10/16/2019    1:09 PM 02/18/2016    3:48 PM  PHQ 2/9 Scores  PHQ - 2 Score 1 0 0 0 0 0 0  PHQ- 9 Score  0 1 0       Fall Risk  06/27/2024    9:00 AM 02/06/2023   12:11 PM 05/18/2022    1:03 PM 03/22/2021   11:04 AM 04/14/2020   10:09 AM  Fall Risk   Falls in the past  year? 0 0 0 0 0  Number falls in past yr: 0      Injury with Fall? 0      Risk for fall due to : No Fall Risks      Follow up Falls evaluation completed        MEDICARE RISK AT HOME:  Medicare Risk at Home Any stairs in or around the home?: No If so, are there any without handrails?: No Home free of loose throw rugs in walkways, pet beds, electrical cords, etc?: Yes Adequate lighting in your home to reduce risk of falls?: Yes Life alert?: No Use of a cane, walker or w/c?: No Grab bars in the bathroom?: No Shower chair or bench in shower?: No Elevated toilet seat or a handicapped toilet?: No  TIMED UP AND GO:  Was the test performed?  no  Cognitive Function: 6CIT completed        Immunizations Immunization History  Administered Date(s) Administered   Fluad Trivalent(High Dose 65+) 06/13/2023   Influenza,inj,Quad PF,6+ Mos 09/02/2013   PFIZER(Purple Top)SARS-COV-2 Vaccination 12/18/2019, 01/09/2020, 08/23/2020   Pneumococcal Conjugate-13 10/16/2019   Pneumococcal Polysaccharide-23 09/07/2012   Tdap 10/16/2019   Zoster Recombinant(Shingrix) 07/16/2018, 09/16/2018    Screening Tests Health Maintenance  Topic Date Due   Lung Cancer Screening  02/14/2024   Influenza Vaccine  04/18/2024   COVID-19 Vaccine (4 - 2025-26 season) 05/19/2024   Medicare Annual Wellness (AWV)  06/27/2025   DTaP/Tdap/Td (2 - Td or Tdap) 10/15/2029   Pneumococcal Vaccine: 50+ Years  Completed   Hepatitis C Screening  Completed   Zoster Vaccines- Shingrix  Completed   Meningococcal B Vaccine  Aged Out    Health Maintenance Items Addressed: Vaccines Given today: Flu high-dose lot#[id number],NDC [phone], pt left no c/o   Additional Screening:  Vision Screening: Recommended annual ophthalmology exams for early detection of glaucoma and other disorders of the eye. Is the patient up to date with their annual eye exam?  Yes  Who is the provider or what is the name of the office in which the  patient attends annual eye exams? [facility] in [facility]  Dental Screening: Recommended annual dental exams for proper oral hygiene  Community Resource Referral / Chronic Care Management: CRR required this visit?  No   CCM required this visit?  No   Plan:    I have personally reviewed and noted the following in the patient's chart:   Medical and social history Use of alcohol, tobacco or illicit drugs  Current medications and supplements including opioid prescriptions. Patient is not currently taking opioid prescriptions. Functional ability and status Nutritional status Physical activity Advanced directives List of other physicians Hospitalizations, surgeries, and ER visits in previous 12 months Vitals Screenings to include cognitive, depression, and falls Referrals and appointments  In addition, I have reviewed and discussed with patient certain preventive protocols, quality metrics, and best practice recommendations. A written personalized care plan for preventive services as well as general preventive health recommendations were provided to patient.   Ronnie Harvey, CMA   06/27/2024   After Visit Summary: (In Person-Declined) Patient declined AVS at this time.  Notes: Nothing significant to report at this time.

## 2024-07-08 ENCOUNTER — Ambulatory Visit (INDEPENDENT_AMBULATORY_CARE_PROVIDER_SITE_OTHER): Admitting: Family Medicine

## 2024-07-08 ENCOUNTER — Encounter: Payer: Self-pay | Admitting: Family Medicine

## 2024-07-08 VITALS — BP 119/61 | HR 59 | Temp 96.5°F | Ht 62.0 in | Wt 130.6 lb

## 2024-07-08 DIAGNOSIS — R351 Nocturia: Secondary | ICD-10-CM | POA: Insufficient documentation

## 2024-07-08 DIAGNOSIS — I5022 Chronic systolic (congestive) heart failure: Secondary | ICD-10-CM

## 2024-07-08 DIAGNOSIS — E782 Mixed hyperlipidemia: Secondary | ICD-10-CM | POA: Diagnosis not present

## 2024-07-08 DIAGNOSIS — J432 Centrilobular emphysema: Secondary | ICD-10-CM | POA: Insufficient documentation

## 2024-07-08 DIAGNOSIS — Z0001 Encounter for general adult medical examination with abnormal findings: Secondary | ICD-10-CM

## 2024-07-08 DIAGNOSIS — Z0000 Encounter for general adult medical examination without abnormal findings: Secondary | ICD-10-CM

## 2024-07-08 DIAGNOSIS — I10 Essential (primary) hypertension: Secondary | ICD-10-CM

## 2024-07-08 DIAGNOSIS — Z122 Encounter for screening for malignant neoplasm of respiratory organs: Secondary | ICD-10-CM

## 2024-07-08 NOTE — Progress Notes (Signed)
 Complete physical exam  Patient: Ronnie Harvey   DOB: 10-04-1945   78 y.o. Male  MRN: [id number]  Subjective:    Chief Complaint  Patient presents with   Annual Exam    Ronnie Harvey is a 78 y.o. male who presents today for a complete physical exam. He reports consuming a general diet. The patient does not participate in regular exercise at present. He generally feels well. He reports sleeping well. He does not have additional problems to discuss today.    He last underwent lung cancer screening in May of the previous year and missed this year's screening. He has a history of emphysema noted on imaging during previous screenings. He mentions a cough but is unsure if it is related to his emphysema and denies shortness of breath. He mentions a cough but is unsure if it is related to his emphysema.  He has a history of elevated PSA levels, with the last noted elevation in 2024. He follows up with a urologist every six months and is scheduled to see him again in April. He experiences occasional urinary symptoms such as weak stream and nocturia a couple of times a week.  He has a history of heart failure but reports no changes or worsening symptoms. His current medications include spironolactone , nitroglycerin  as needed, losartan , furosemide , carvedilol , atorvastatin , and aspirin . He also uses colloidal silver.  He received his flu shot and COVID booster this year, with the COVID booster administered at [facility] about eleven days ago. No changes in hearing or vision, and he denies night sweats, rapid weight loss, or changes in diet. He does not engage in daily physical activity but is able to perform necessary tasks without significant shortness of breath.  He has a longstanding lesion on his face that he has not had evaluated by dermatology. It occasionally grows and he peels it off, after which it returns.  He has never had a colonoscopy or any colorectal cancer screening and has no family  history of colorectal cancer. He denies any family history of prostate cancer.       Most recent fall risk assessment:    07/08/2024    9:38 AM  Fall Risk   Falls in the past year? 0  Risk for fall due to : No Fall Risks  Follow up Falls evaluation completed     Most recent depression screenings:    07/08/2024    9:39 AM 06/27/2024    9:21 AM  PHQ 2/9 Scores  PHQ - 2 Score 0 1  PHQ- 9 Score 0     Vision:Within last year and Dental: No current dental problems  Patient Active Problem List   Diagnosis Date Noted   Nocturia 07/08/2024   Centrilobular emphysema (HCC) 07/08/2024   Chronic systolic HF (heart failure) (HCC) 03/29/2020   Essential hypertension 10/16/2019   Carotid stenosis 12/03/2014   Hyperlipidemia 09/02/2013   Peripheral arterial disease 05/20/2013   Paroxysmal AF 09/03/2012   CAD (coronary artery disease) 09/03/2012   Past Medical History:  Diagnosis Date   Arthritis    CAD (coronary artery disease) nonobstructive   a. 10/2012 Cath: LM 50, LAD 25p, 37m, Diag 55m, LCX 30p, 40d, OM1 60-70, OM3 30, RCA 40-8m, AM 70p, PDA nl, EF 20%. c.  Stents to RCA circumvlex  06/2017 in [place].     Cardiomyopathy, dilated (HCC)    a. 09/2012 Echo: EF 15-20%, diff HK, gr2 DD, Triv AI, Mod MR, mild TR/PR, mod-sev dil LA.  CHF (congestive heart failure) (HCC)    Diverticulitis    Essential hypertension 10/16/2019   ETOH abuse    Quit 1998   GERD (gastroesophageal reflux disease)    rarely (worse when he was drinking)   Itchy skin    Myocardial infarction (HCC)    PAF (paroxysmal atrial fibrillation) (HCC)    PVD (peripheral vascular disease)    a. 05/2013 s/p PTA/self-expanding stenting of the REIA and LCIA.   Retinal hole of right eye    Shortness of breath dyspnea    with exertion   Tobacco abuse    Quit Dec 2012   Past Surgical History:  Procedure Laterality Date   ABDOMINAL AORTAGRAM N/A 05/28/2013   Procedure: ABDOMINAL AORTAGRAM;  Surgeon: Deatrice DELENA Cage, MD;  Location: [facility] CATH LAB;  Service: Cardiovascular;  Laterality: N/A;   CAROTID ANGIOGRAM Bilateral 10/27/2014   Procedure: CAROTID ANGIOGRAM;  Surgeon: Gaile LELON New, MD;  Location: [facility] CATH LAB;  Service: Cardiovascular;  Laterality: Bilateral;   CAROTID ENDARTERECTOMY Left December 03, 2014   CEA   ENDARTERECTOMY Left 12/03/2014   Procedure: ENDARTERECTOMY CAROTID WITH PATCH ANGIOPLASTY;  Surgeon: Gaile LELON New, MD;  Location: [facility] OR;  Service: Vascular;  Laterality: Left;   EYE SURGERY Right    cataract surgery with lens implant   Neck cyst resection     PERCUTANEOUS STENT INTERVENTION Right 05/28/2013   Procedure: PERCUTANEOUS STENT INTERVENTION;  Surgeon: Deatrice DELENA Cage, MD;  Location: [facility] CATH LAB;  Service: Cardiovascular;  Laterality: Right;   VASECTOMY     Social History   Tobacco Use   Smoking status: Former    Current packs/day: 0.00    Average packs/day: 1 pack/day for 49.0 years (49.0 ttl pk-yrs)    Types: Cigarettes    Start date: 08/30/2012    Quit date: 11/24/2012    Years since quitting: 11.6   Smokeless tobacco: Former    Types: Snuff  Substance Use Topics   Alcohol use: No    Alcohol/week: 0.0 standard drinks of alcohol    Comment: Former ETOH.  None since 1998 Prior to 1998 was a heavy drinker   Drug use: No   Social History   Socioeconomic History   Marital status: Widowed    Spouse name: Not on file   Number of children: Not on file   Years of education: Not on file   Highest education level: Not on file  Occupational History   Occupation: Retired  Tobacco Use   Smoking status: Former    Current packs/day: 0.00    Average packs/day: 1 pack/day for 49.0 years (49.0 ttl pk-yrs)    Types: Cigarettes    Start date: 08/30/2012    Quit date: 11/24/2012    Years since quitting: 11.6   Smokeless tobacco: Former    Types: Snuff  Substance and Sexual Activity   Alcohol use: No    Alcohol/week: 0.0 standard drinks of alcohol    Comment: Former ETOH.   None since 1998 Prior to 1998 was a heavy drinker   Drug use: No   Sexual activity: Not on file  Other Topics Concern   Not on file  Social History Narrative   Not on file   Social Drivers of Health   Financial Resource Strain: Low Risk  (06/27/2024)   Overall Financial Resource Strain (CARDIA)    Difficulty of Paying Living Expenses: Not hard at all  Food Insecurity: No Food Insecurity (06/27/2024)   Hunger Vital Sign  Worried About Programme researcher, broadcasting/film/video in the Last Year: Never true    [employer] of Food in the Last Year: Never true  Transportation Needs: No Transportation Needs (06/27/2024)   PRAPARE - Administrator, Civil Service (Medical): No    Lack of Transportation (Non-Medical): No  Physical Activity: Inactive (06/27/2024)   Exercise Vital Sign    Days of Exercise per Week: 0 days    Minutes of Exercise per Session: 0 min  Stress: No Stress Concern Present (06/27/2024)   [employer] of Occupational Health - Occupational Stress Questionnaire    Feeling of Stress: Not at all  Social Connections: Socially Isolated (06/27/2024)   Social Connection and Isolation Panel    Frequency of Communication with Friends and Family: Three times a week    Frequency of Social Gatherings with Friends and Family: Never    Attends Religious Services: Never    Database administrator or Organizations: No    Attends Banker Meetings: Never    Marital Status: Widowed  Intimate Partner Violence: Not At Risk (06/27/2024)   Humiliation, Afraid, Rape, and Kick questionnaire    Fear of Current or Ex-Partner: No    Emotionally Abused: No    Physically Abused: No    Sexually Abused: No   Family Status  Relation Name Status   Mother  Deceased at age 71   Father  Deceased at age 60   Brother  Alive   Sister  Alive   Brother  Alive   Brother  Alive   Brother  Alive   MGM  Deceased   MGF  Deceased   PGM  Deceased   PGF  Deceased  No partnership data on file    Family History  Problem Relation Age of Onset   Diabetes Mother    Diabetes Father    Diabetes Brother    COPD Brother    Breast cancer Sister 70   Diabetes Brother    No Known Allergies    Patient Care Team: Kruz Chiu, Rock HERO, FNP as PCP - General (Family Medicine) Lavona Agent, MD as Consulting Physician (Cardiology) Gladis Mustard, FNP as Nurse Practitioner (Family Medicine) Darron Deatrice LABOR, MD as Consulting Physician (Cardiology)   Outpatient Medications Prior to Visit  Medication Sig   aspirin  EC 81 MG tablet Take 1 tablet (81 mg total) by mouth daily.   atorvastatin  (LIPITOR) 40 MG tablet TAKE 1 TABLET BY MOUTH DAILY   carvedilol  (COREG ) 12.5 MG tablet TAKE 1 TABLET BY MOUTH TWICE  DAILY   furosemide  (LASIX ) 20 MG tablet TAKE 1 TABLET BY MOUTH ON MONDAY WEDNESDAY AND FRIDAY   losartan  (COZAAR ) 25 MG tablet TAKE 1 TABLET BY MOUTH DAILY   nitroGLYCERIN  (NITROSTAT ) 0.4 MG SL tablet Place 1 tablet (0.4 mg total) under the tongue every 5 (five) minutes as needed for chest pain.   NON FORMULARY Colloidal silver 1 oz every morning.   spironolactone  (ALDACTONE ) 25 MG tablet TAKE 1 TABLET BY MOUTH ON  MONDAY, WEDNESDAY, AND FRIDAY   [DISCONTINUED] fluticasone  (FLONASE ) 50 MCG/ACT nasal spray Place 2 sprays into both nostrils daily.   No facility-administered medications prior to visit.    ROS per HPI      Objective:     BP 119/61   Pulse (!) 59   Temp (!) 96.5 F (35.8 C)   Ht 5' 2 (1.575 m)   Wt 130 lb 9.6 oz (59.2 kg)   SpO2 92%  BMI 23.89 kg/m  BP Readings from Last 3 Encounters:  07/08/24 119/61  06/27/24 139/70  04/14/24 (!) 146/80   Wt Readings from Last 3 Encounters:  07/08/24 130 lb 9.6 oz (59.2 kg)  06/27/24 130 lb 3.2 oz (59.1 kg)  04/14/24 129 lb 4.8 oz (58.7 kg)   SpO2 Readings from Last 3 Encounters:  07/08/24 92%  07/02/23 93%  06/13/23 92%      Physical Exam Vitals and nursing note reviewed.  Constitutional:      General:  He is not in acute distress.    Appearance: Normal appearance. He is well-developed, well-groomed and normal weight. He is not ill-appearing, toxic-appearing or diaphoretic.  HENT:     Head: Normocephalic and atraumatic.     Jaw: There is normal jaw occlusion.     Right Ear: Hearing, tympanic membrane, ear canal and external ear normal.     Left Ear: Hearing, tympanic membrane, ear canal and external ear normal.     Nose: Nose normal.     Mouth/Throat:     Lips: Pink.     Mouth: Mucous membranes are moist.     Dentition: Has dentures.     Pharynx: Oropharynx is clear. Uvula midline.  Eyes:     General: Lids are normal.     Extraocular Movements: Extraocular movements intact.     Conjunctiva/sclera: Conjunctivae normal.     Pupils: Pupils are equal, round, and reactive to light.  Neck:     Thyroid : No thyroid  mass, thyromegaly or thyroid  tenderness.     Vascular: Carotid bruit present. No JVD.     Trachea: Trachea and phonation normal.  Cardiovascular:     Rate and Rhythm: Normal rate and regular rhythm.     Chest Wall: PMI is not displaced.     Pulses:          Dorsalis pedis pulses are 2+ on the right side and 2+ on the left side.       Posterior tibial pulses are 2+ on the right side and 2+ on the left side.     Heart sounds: Normal heart sounds. No murmur heard.    No friction rub. No gallop.  Pulmonary:     Effort: Pulmonary effort is normal. No respiratory distress.     Breath sounds: Normal breath sounds. No wheezing.  Chest:     Chest wall: No mass.  Breasts:    Breasts are symmetrical.  Abdominal:     General: Abdomen is flat. Bowel sounds are normal. There is no distension or abdominal bruit.     Palpations: Abdomen is soft. There is no hepatomegaly or splenomegaly.     Tenderness: There is no abdominal tenderness. There is no right CVA tenderness or left CVA tenderness.     Hernia: No hernia is present.  Musculoskeletal:     Cervical back: Full passive range of  motion without pain, normal range of motion and neck supple.     Right lower leg: No edema.     Left lower leg: No edema.     Comments: Kyphosis   Feet:     Right foot:     Skin integrity: Skin integrity normal.     Toenail Condition: Right toenails are normal.     Left foot:     Skin integrity: Skin integrity normal.     Toenail Condition: Left toenails are normal.  Lymphadenopathy:     Cervical: No cervical adenopathy.     Upper Body:  Right upper body: No supraclavicular, axillary or pectoral adenopathy.     Left upper body: No supraclavicular, axillary or pectoral adenopathy.  Skin:    General: Skin is warm and dry.     Capillary Refill: Capillary refill takes less than 2 seconds.     Coloration: Skin is not cyanotic, jaundiced or pale.     Findings: Lesion (scaly lesion to right cheek) present. No rash.  Neurological:     General: No focal deficit present.     Mental Status: He is alert and oriented to person, place, and time.     Sensory: Sensation is intact.     Motor: Motor function is intact.     Coordination: Coordination is intact.     Gait: Gait abnormal (antalgic).     Deep Tendon Reflexes: Reflexes are normal and symmetric.  Psychiatric:        Attention and Perception: Attention and perception normal.        Mood and Affect: Mood and affect normal.        Speech: Speech normal.        Behavior: Behavior normal. Behavior is cooperative.        Thought Content: Thought content normal.        Cognition and Memory: Cognition and memory normal.        Judgment: Judgment normal.       Last CBC Lab Results  Component Value Date   WBC 7.7 06/13/2023   HGB 14.6 06/13/2023   HCT 43.6 06/13/2023   MCV 96 06/13/2023   MCH 32.2 06/13/2023   RDW 11.7 06/13/2023   PLT 205 06/13/2023   Last metabolic panel Lab Results  Component Value Date   GLUCOSE 97 07/09/2023   NA 140 07/09/2023   K 4.6 07/09/2023   CL 102 07/09/2023   CO2 24 07/09/2023   BUN 19  07/09/2023   CREATININE 1.00 07/09/2023   EGFR 77 07/09/2023   CALCIUM  9.5 07/09/2023   PHOS 2.5 09/06/2012   PROT 7.1 06/13/2023   ALBUMIN 4.3 06/13/2023   LABGLOB 2.8 06/13/2023   AGRATIO 1.3 05/18/2022   BILITOT 0.7 06/13/2023   ALKPHOS 134 (H) 06/13/2023   AST 23 06/13/2023   ALT 10 06/13/2023   ANIONGAP 7 12/04/2014   Last lipids Lab Results  Component Value Date   CHOL 105 06/13/2023   HDL 31 (L) 06/13/2023   LDLCALC 56 06/13/2023   LDLDIRECT 74.9 09/02/2013   TRIG 93 06/13/2023   CHOLHDL 3.4 06/13/2023   Last hemoglobin A1c Lab Results  Component Value Date   HGBA1C 5.7 (H) 09/07/2012   Last thyroid  functions Lab Results  Component Value Date   TSH 1.690 06/13/2023   T4TOTAL 10.2 06/13/2023      Assessment & Plan:    Routine Health Maintenance and Physical Exam  Immunization History  Administered Date(s) Administered   Fluad Trivalent(High Dose 65+) 06/13/2023   INFLUENZA, HIGH DOSE SEASONAL PF 06/27/2024   Influenza,inj,Quad PF,6+ Mos 09/02/2013   PFIZER(Purple Top)SARS-COV-2 Vaccination 12/18/2019, 01/09/2020, 08/23/2020   Pneumococcal Conjugate-13 10/16/2019   Pneumococcal Polysaccharide-23 09/07/2012   Tdap 10/16/2019   Zoster Recombinant(Shingrix) 07/16/2018, 09/16/2018    Health Maintenance  Topic Date Due   Lung Cancer Screening  02/14/2024   COVID-19 Vaccine (4 - 2025-26 season) 07/24/2024 (Originally 05/19/2024)   Medicare Annual Wellness (AWV)  06/27/2025   DTaP/Tdap/Td (2 - Td or Tdap) 10/15/2029   Pneumococcal Vaccine: 50+ Years  Completed   Influenza Vaccine  Completed  Hepatitis C Screening  Completed   Zoster Vaccines- Shingrix  Completed   Meningococcal B Vaccine  Aged Out    Discussed health benefits of physical activity, and encouraged him to engage in regular exercise appropriate for his age and condition.  Problem List Items Addressed This Visit       Cardiovascular and Mediastinum   Essential hypertension   Relevant  Orders   CBC with Differential/Platelet   CMP14+EGFR   TSH   T4, free   Chronic systolic HF (heart failure) (HCC)   Relevant Orders   CBC with Differential/Platelet   CMP14+EGFR   Lipid panel   TSH   T4, free     Respiratory   Centrilobular emphysema (HCC)   Relevant Orders   Ambulatory Referral for Lung Cancer Screening [REF832]     Other   Hyperlipidemia   Relevant Orders   CMP14+EGFR   Lipid panel   Nocturia   Relevant Orders   PSA, total and free   Other Visit Diagnoses       Annual physical exam    -  Primary   Relevant Orders   CBC with Differential/Platelet   CMP14+EGFR   Lipid panel     Screening for lung cancer       Relevant Orders   Ambulatory Referral for Lung Cancer Screening [REF832]         Adult Wellness Visit Routine adult wellness visit with no new complaints or changes in health status. Vaccinations are current, including flu shot and COVID booster. No recent lung cancer screening; last screening was in May of the previous year. No history or family history of colorectal cancer screening. Regular follow-up with urology for prostate issues and cardiology for heart failure. No changes in hearing, vision, weight, or night sweats. Diet and bowel habits are stable. No dermatology referral desired for facial lesion. - Order lung cancer screening - Update labs - Document [facility] booster [facility]  Heart failure Well-managed heart failure with no worsening symptoms. Regular follow-up with cardiology. Current medications include spironolactone , nitroglycerin  as needed, losartan , furosemide , carvedilol , atorvastatin , and aspirin .  Essential hypertension Hypertension managed with losartan .  Mixed hyperlipidemia Mixed hyperlipidemia managed with atorvastatin .  Prostate disorder with prior elevated PSA Elevated PSA in 2024, now improved. Regular follow-up with urology every six months. Next appointment scheduled for April. No significant changes in urinary  habits. PSA trending down, indicating reduced inflammation.  Emphysema Emphysema identified on lung cancer screening imaging. No current use of inhalers; declined inhaler use. Cough present but not worsening. No desire for treatment at this time. - Provide information on emphysema - Review CT results for progression       Return in 6 months (on 01/06/2025), or if symptoms worsen or fail to improve, for chronic follow up.     Rosaline Bruns, FNP

## 2024-07-09 ENCOUNTER — Ambulatory Visit: Payer: Self-pay | Admitting: Family Medicine

## 2024-07-09 LAB — CBC WITH DIFFERENTIAL/PLATELET
Basophils Absolute: 0 x10E3/uL (ref 0.0–0.2)
Basos: 1 %
EOS (ABSOLUTE): 0.3 x10E3/uL (ref 0.0–0.4)
Eos: 4 %
Hematocrit: 41.5 % (ref 37.5–51.0)
Hemoglobin: 13.5 g/dL (ref 13.0–17.7)
Immature Grans (Abs): 0 x10E3/uL (ref 0.0–0.1)
Immature Granulocytes: 0 %
Lymphocytes Absolute: 1.2 x10E3/uL (ref 0.7–3.1)
Lymphs: 18 %
MCH: 31.8 pg (ref 26.6–33.0)
MCHC: 32.5 g/dL (ref 31.5–35.7)
MCV: 98 fL — ABNORMAL HIGH (ref 79–97)
Monocytes Absolute: 0.7 x10E3/uL (ref 0.1–0.9)
Monocytes: 10 %
Neutrophils Absolute: 4.4 x10E3/uL (ref 1.4–7.0)
Neutrophils: 67 %
Platelets: 176 x10E3/uL (ref 150–450)
RBC: 4.24 x10E6/uL (ref 4.14–5.80)
RDW: 11.8 % (ref 11.6–15.4)
WBC: 6.6 x10E3/uL (ref 3.4–10.8)

## 2024-07-09 LAB — CMP14+EGFR
ALT: 8 IU/L (ref 0–44)
AST: 19 IU/L (ref 0–40)
Albumin: 3.9 g/dL (ref 3.8–4.8)
Alkaline Phosphatase: 107 IU/L (ref 47–123)
BUN/Creatinine Ratio: 22 (ref 10–24)
BUN: 20 mg/dL (ref 8–27)
Bilirubin Total: 0.6 mg/dL (ref 0.0–1.2)
CO2: 23 mmol/L (ref 20–29)
Calcium: 9.2 mg/dL (ref 8.6–10.2)
Chloride: 104 mmol/L (ref 96–106)
Creatinine, Ser: 0.91 mg/dL (ref 0.76–1.27)
Globulin, Total: 3 g/dL (ref 1.5–4.5)
Glucose: 101 mg/dL — ABNORMAL HIGH (ref 70–99)
Potassium: 4.4 mmol/L (ref 3.5–5.2)
Sodium: 141 mmol/L (ref 134–144)
Total Protein: 6.9 g/dL (ref 6.0–8.5)
eGFR: 86 mL/min/1.73 (ref >59)

## 2024-07-09 LAB — PSA, TOTAL AND FREE
PSA, Free Pct: 10.6 %
PSA, Free: 0.36 ng/mL
Prostate Specific Ag, Serum: 3.4 ng/mL (ref 0.0–4.0)

## 2024-07-09 LAB — LIPID PANEL
Chol/HDL Ratio: 3.4 ratio (ref 0.0–5.0)
Cholesterol, Total: 92 mg/dL — ABNORMAL LOW (ref 100–199)
HDL: 27 mg/dL — ABNORMAL LOW (ref >39)
LDL Chol Calc (NIH): 47 mg/dL (ref 0–99)
Triglycerides: 94 mg/dL (ref 0–149)
VLDL Cholesterol Cal: 18 mg/dL (ref 5–40)

## 2024-07-09 LAB — TSH: TSH: 1.66 u[IU]/mL (ref 0.450–4.500)

## 2024-07-09 LAB — T4, FREE: Free T4: 1.2 ng/dL (ref 0.82–1.77)

## 2024-07-14 ENCOUNTER — Other Ambulatory Visit: Payer: Self-pay | Admitting: Cardiology

## 2024-08-23 ENCOUNTER — Other Ambulatory Visit: Payer: Self-pay | Admitting: Nurse Practitioner

## 2024-08-23 DIAGNOSIS — I6523 Occlusion and stenosis of bilateral carotid arteries: Secondary | ICD-10-CM

## 2024-08-23 DIAGNOSIS — I739 Peripheral vascular disease, unspecified: Secondary | ICD-10-CM

## 2024-08-23 DIAGNOSIS — E782 Mixed hyperlipidemia: Secondary | ICD-10-CM

## 2024-08-23 DIAGNOSIS — I2510 Atherosclerotic heart disease of native coronary artery without angina pectoris: Secondary | ICD-10-CM

## 2024-09-24 ENCOUNTER — Encounter: Payer: Self-pay | Admitting: *Deleted

## 2024-10-07 ENCOUNTER — Other Ambulatory Visit: Payer: Self-pay | Admitting: Cardiology

## 2024-10-21 ENCOUNTER — Encounter

## 2024-10-21 ENCOUNTER — Ambulatory Visit

## 2024-11-11 ENCOUNTER — Ambulatory Visit

## 2024-11-11 ENCOUNTER — Encounter

## 2025-01-01 ENCOUNTER — Other Ambulatory Visit

## 2025-01-07 ENCOUNTER — Ambulatory Visit: Admitting: Urology

## 2025-01-07 ENCOUNTER — Ambulatory Visit: Admitting: Cardiology

## 2025-01-20 ENCOUNTER — Ambulatory Visit: Payer: Self-pay | Admitting: Family Medicine

## 2025-01-21 ENCOUNTER — Ambulatory Visit: Admitting: Family Medicine

## 2025-06-30 ENCOUNTER — Ambulatory Visit

## 2025-07-03 ENCOUNTER — Ambulatory Visit: Payer: Self-pay

## 2025-07-09 ENCOUNTER — Encounter: Payer: Self-pay | Admitting: Family Medicine
